# Patient Record
Sex: Female | Born: 1935 | Race: White | Hispanic: No | Smoking: Never smoker
Health system: Southern US, Community
[De-identification: ages and names within clinical notes are randomized; demographics above are authoritative.]

## PROBLEM LIST (undated history)

## (undated) DIAGNOSIS — I10 Essential (primary) hypertension: Secondary | ICD-10-CM

## (undated) DIAGNOSIS — K219 Gastro-esophageal reflux disease without esophagitis: Secondary | ICD-10-CM

## (undated) DIAGNOSIS — I219 Acute myocardial infarction, unspecified: Secondary | ICD-10-CM

## (undated) DIAGNOSIS — IMO0001 Reserved for inherently not codable concepts without codable children: Secondary | ICD-10-CM

## (undated) DIAGNOSIS — M069 Rheumatoid arthritis, unspecified: Secondary | ICD-10-CM

## (undated) HISTORY — DX: Reserved for inherently not codable concepts without codable children: IMO0001

## (undated) HISTORY — DX: Rheumatoid arthritis, unspecified: M06.9

## (undated) HISTORY — DX: Acute myocardial infarction, unspecified: I21.9

## (undated) HISTORY — DX: Gastro-esophageal reflux disease without esophagitis: K21.9

## (undated) HISTORY — DX: Essential (primary) hypertension: I10

---

## 2013-07-23 DIAGNOSIS — B351 Tinea unguium: Secondary | ICD-10-CM

## 2013-09-09 ENCOUNTER — Encounter (INDEPENDENT_AMBULATORY_CARE_PROVIDER_SITE_OTHER): Payer: Self-pay

## 2013-09-09 ENCOUNTER — Ambulatory Visit (INDEPENDENT_AMBULATORY_CARE_PROVIDER_SITE_OTHER): Payer: Medicare Other

## 2013-09-09 VITALS — BP 156/86 | HR 62 | Resp 18

## 2013-09-09 DIAGNOSIS — L03039 Cellulitis of unspecified toe: Secondary | ICD-10-CM

## 2013-09-09 DIAGNOSIS — S90121A Contusion of right lesser toe(s) without damage to nail, initial encounter: Secondary | ICD-10-CM

## 2013-09-09 DIAGNOSIS — M79609 Pain in unspecified limb: Secondary | ICD-10-CM

## 2013-09-09 DIAGNOSIS — M722 Plantar fascial fibromatosis: Secondary | ICD-10-CM

## 2013-09-09 DIAGNOSIS — B351 Tinea unguium: Secondary | ICD-10-CM

## 2013-09-09 DIAGNOSIS — E1140 Type 2 diabetes mellitus with diabetic neuropathy, unspecified: Secondary | ICD-10-CM

## 2013-09-09 DIAGNOSIS — L600 Ingrowing nail: Secondary | ICD-10-CM

## 2013-09-09 DIAGNOSIS — Q828 Other specified congenital malformations of skin: Secondary | ICD-10-CM

## 2013-09-09 MED ORDER — TRIAMCINOLONE ACETONIDE 10 MG/ML IJ SUSP: 10.0000 mg | Freq: Once | INTRAMUSCULAR | Status: DC

## 2013-09-09 MED ORDER — CEPHALEXIN 500 MG PO CAPS: 500.0000 mg | ORAL_CAPSULE | Freq: Three times a day (TID) | ORAL | Status: DC

## 2013-09-09 NOTE — Progress Notes (Signed)
Subjective:    Patient ID: Kirsten Gallegos, female    DOB: [birth year], 77 y.o.   MRN: [id number]  HPI trim my nails and this toenail i dropped a gallon orange juice and was about 2 1/2 weeks ago and has been draining and it looks like an ingrown Patient is also been having pain inferior aspect left heel for several weeks ago. Patient's husband died recently she's been on her feet doing less walking last several weeks has been trying her comfortable shoes has recalcitrant plantar fascial heel pain.   Review of Systems  Constitutional: Negative.   HENT: Negative.   Eyes: Positive for visual disturbance.  Respiratory: Negative.   Cardiovascular: Negative.   Gastrointestinal: Negative.   Endocrine: Negative.   Genitourinary: Negative.   Musculoskeletal: Negative.   Skin: Negative.   Allergic/Immunologic: Negative.   Neurological: Negative.   Hematological: Bruises/bleeds easily.  Psychiatric/Behavioral: Negative.        Objective:   Physical Exam  Vitals reviewed. Constitutional: She is oriented to person, place, and time. She appears well-developed and well-nourished.  Cardiovascular:  Pulses:      Dorsalis pedis pulses are 1+ on the right side, and 1+ on the left side.       Posterior tibial pulses are 1+ on the right side, and 1+ on the left side.  Capillary refill 3 seconds all digits bilateral skin temperature warm turgor normal no edema noted moderate varicosities bilateral. There is erythema of the right hallux lateral nail fold there is ecchymosis and edema of the hallux and second digit right foot.  Musculoskeletal:  Orthopedic biomechanical exam reveals hammertoe deformities 2 through 5 bilateral. 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 There is history of contusion to the hallux and second digit right foot drop the bowel or half gallon of orange juice on the toes 2 weeks ago. Since that time has had ingrowing infection right hallux.   Neurological: She is alert and oriented to person, place, and time. She has normal strength and normal reflexes.  Epicritic and proprioceptive sensations are diminished on Semmes Weinstein testing to forefoot and digits intact sensation proximally foot leg and ankle DTRs intact we'll plantar response noted bilateral  Skin: Skin is warm and dry. No cyanosis. Nails show no clubbing.  Skin color pigment normal moderate varicosities noted hair growth absent. There is paronychia edema and erythema and discharge drainage lateral nail fold right hallux. Ecchymosis of the hallux and second digit right foot consistent with contusion. This thickening friability discoloration and brittleness and tenderness on palpation of nails 1 through 5 bilateral consistent with history of onychomycosis. Otherwise has relatively thin skin with decreased turgor and atrophy of plantar fat pad. There is diffuse keratoses sub-second MTP area right secondary to rheumatoid arthropathy changes.  Psychiatric: She has a normal mood and affect. Her behavior is normal.          Assessment & Plan:  Diabetes with peripheral neuropathy. Some mild angiopathy noted as well. Paronychia with ingrowing nail right hallux secondary to contusion this we addressed at this time with an I&D AP nail partial excision lateral nail border under local anesthetic block Mr. total of 3 cc 50-50 mixture of 2% Xylocaine plain and 0.5 some Marcaine plain. Betadine prep was performed lateral nail border is excised phenol matricectomy was performed alcohol wash was applied Betadine ointment and a dry sterile dressing applied. Patient is instructed in Betadine soaks and prescription for cephalexin is called in. Recommended Tylenol as needed  for pain. Recheck in 2 weeks for nail check  Diabetes with neuropathy and onychomycosis: Mycotic friable dystrophic nails debrided x9 the remaining nails 2 through 5 on the right one through 5 left are debrided at this time  the presence of pain symptomology friability and discomfort also debridement of punctate keratoses subsecond MTP area right foot which shows a blistering lesion which is treated with Neosporin and gauze dressing as well this time. Followup in 3 months for continued or recurrent palliative care.  Plantar fascial heel pain: Per patient request injection 10 musculoskeletal 20 mg Xylocaine plain is infiltrated to the inferior left heel patient tolerated this well. Will maintain a good athletic or walking shoe. Maybe candidate for new diabetic shoes and custom insoles in the near future. Suggested followup on shoes at her earliest convenience may discuss this further at her postop nail followup with the next 2 weeks. Patient is devised use Tylenol as needed for pain management in the interim. Recheck in 2 week  Alvan Dame DPM

## 2013-09-09 NOTE — Patient Instructions (Signed)
ICE INSTRUCTIONS apply ice to left heel 3 times a day  Apply ice or cold pack to the affected area at least 3 times a day for 10-15 minutes each time.  You should also use ice after prolonged activity or vigorous exercise.  Do not apply ice longer than 20 minutes at one time.  Always keep a cloth between your skin and the ice pack to prevent burns.  Being consistent and following these instructions will help control your symptoms.  We suggest you purchase a gel ice pack because they are reusable and do bit leak.  Some of them are designed to wrap around the area.  Use the method that works best for you.  Here are some other suggestions for icing.   Use a frozen bag of peas or corn-inexpensive and molds well to your body, usually stays frozen for 10 to 20 minutes. Wet a towel with cold water and squeeze out the excess until it's damp.  Place in a bag in the freezer for 20 minutes. Then remove and use    Begin Betadine soaks of right great toe starting the day after procedure .Betadine Soak Instructions  Purchase an 8 oz. bottle of BETADINE solution (Povidone)  THE DAY AFTER THE PROCEDURE  Place 1 tablespoon of betadine solution in a quart of warm tap water.  Submerge your foot or feet with outer bandage intact for the initial soak; this will allow the bandage to become moist and wet for easy lift off.  Once you remove your bandage, continue to soak in the solution for 20 minutes.  This soak should be done twice a day.  Next, remove your foot or feet from solution, blot dry the affected area and cover.  You may use a band aid large enough to cover the area or use gauze and tape.  Apply other medications to the area as directed by the doctor such as cortisporin otic solution (ear drops) or neosporin.  IF YOUR SKIN BECOMES IRRITATED WHILE USING THESE INSTRUCTIONS, IT IS OKAY TO SWITCH TO EPSOM SALTS AND WATER OR WHITE VINEGAR AND WATER.

## 2013-10-14 ENCOUNTER — Ambulatory Visit (INDEPENDENT_AMBULATORY_CARE_PROVIDER_SITE_OTHER): Payer: Medicare Other

## 2013-10-14 DIAGNOSIS — Q828 Other specified congenital malformations of skin: Secondary | ICD-10-CM

## 2013-10-14 DIAGNOSIS — B351 Tinea unguium: Secondary | ICD-10-CM

## 2013-10-14 DIAGNOSIS — E1142 Type 2 diabetes mellitus with diabetic polyneuropathy: Secondary | ICD-10-CM

## 2013-10-14 DIAGNOSIS — E1149 Type 2 diabetes mellitus with other diabetic neurological complication: Secondary | ICD-10-CM

## 2013-10-14 DIAGNOSIS — E1140 Type 2 diabetes mellitus with diabetic neuropathy, unspecified: Secondary | ICD-10-CM

## 2013-10-14 DIAGNOSIS — M722 Plantar fascial fibromatosis: Secondary | ICD-10-CM

## 2013-10-14 NOTE — Patient Instructions (Signed)
Diabetes and Foot Care Diabetes may cause you to have problems because of poor blood supply (circulation) to your feet and legs. This may cause the skin on your feet to become thinner, break easier, and heal more slowly. Your skin may become dry, and the skin may peel and crack. You may also have nerve damage in your legs and feet causing decreased feeling in them. You may not notice minor injuries to your feet that could lead to infections or more serious problems. Taking care of your feet is one of the most important things you can do for yourself.  HOME CARE INSTRUCTIONS  Wear shoes at all times, even in the house. Do not go barefoot. Bare feet are easily injured.  Check your feet daily for blisters, cuts, and redness. If you cannot see the bottom of your feet, use a mirror or ask someone for help.  Wash your feet with warm water (do not use hot water) and mild soap. Then pat your feet and the areas between your toes until they are completely dry. Do not soak your feet as this can dry your skin.  Apply a moisturizing lotion or petroleum jelly (that does not contain alcohol and is unscented) to the skin on your feet and to dry, brittle toenails. Do not apply lotion between your toes.  Trim your toenails straight across. Do not dig under them or around the cuticle. File the edges of your nails with an emery board or nail file.  Do not cut corns or calluses or try to remove them with medicine.  Wear clean socks or stockings every day. Make sure they are not too tight. Do not wear knee-high stockings since they may decrease blood flow to your legs.  Wear shoes that fit properly and have enough cushioning. To break in new shoes, wear them for just a few hours a day. This prevents you from injuring your feet. Always look in your shoes before you put them on to be sure there are no objects inside.  Do not cross your legs. This may decrease the blood flow to your feet.  If you find a minor scrape,  cut, or break in the skin on your feet, keep it and the skin around it clean and dry. These areas may be cleansed with mild soap and water. Do not cleanse the area with peroxide, alcohol, or iodine.  When you remove an adhesive bandage, be sure not to damage the skin around it.  If you have a wound, look at it several times a day to make sure it is healing.  Do not use heating pads or hot water bottles. They may burn your skin. If you have lost feeling in your feet or legs, you may not know it is happening until it is too late.  Make sure your health care provider performs a complete foot exam at least annually or more often if you have foot problems. Report any cuts, sores, or bruises to your health care provider immediately. SEEK MEDICAL CARE IF:   You have an injury that is not healing.  You have cuts or breaks in the skin.  You have an ingrown nail.  You notice redness on your legs or feet.  You feel burning or tingling in your legs or feet.  You have pain or cramps in your legs and feet.  Your legs or feet are numb.  Your feet always feel cold. SEEK IMMEDIATE MEDICAL CARE IF:   There is increasing redness,   swelling, or pain in or around a wound.  There is a red line that goes up your leg.  Pus is coming from a wound.  You develop a fever or as directed by your health care provider.  You notice a bad smell coming from an ulcer or wound. Document Released: 10/18/2000 Document Revised: 06/23/2013 Document Reviewed: 03/30/2013 [employer] Patient Information 2014 [employer].  

## 2013-10-14 NOTE — Progress Notes (Signed)
   Subjective:    Patient ID: Kirsten Gallegos, female    DOB: 04-Jun-1936, 77 y.o.   MRN: [id number]  HPI my nail on right foot and my right foot has a calluses on it    Review of Systems deferred at this time     Objective:   Physical Exam Neurovascular status is intact and unchanged pedal pulses palpable plus one over 4 bilateral although diminished dermatologically skin color pigment normal there is punctate keratoses or hemorrhage a keratoses subsecond MTP area right to plantar flexed metatarsal digital contracture. No active discharge drainage no open ulcer noted. The nail lateral border right hallux is resolved well no discharge or drainage slight tenderness is noted nail still criptotic incurvated patient ready for debridement within 2 months for continued palliative nail care as scheduled. At this time the keratotic lesion of second right is debrided patient continues to Neosporin and Band-Aid dressing and keep the area cushioned with a cushioned insole in depth shoes. Patient did have improvement following steroid injections the inferior left heel there is no inferior plantar fascial pain nor current symptomology noted maintain good shoes with insoles as instructed. Patient taking NSAID therapy an as-needed basis if she has any further flareups or exacerbations       Assessment & Plan:  Assessment this time is resolving are stabilized plantar fasciitis left foot following steroid injection and use of insoles.  Assessment number to is resolved paronychia ingrowing nail  right hallux. No discharge or drainage noted. No secondary infection noted.  Has residual hemorrhage a keratoses secondary plantarflexed metatarsal subsecond right keratosis is debrided in maintain Neosporin and Band-Aid dressing as needed maintain accommodative insoles as instructed  Reappointed in 2 weeks for followup and palliative care diabetic foot and nail care debridement be carried out at that time.  Alvan Dame DPM

## 2013-11-18 ENCOUNTER — Ambulatory Visit (INDEPENDENT_AMBULATORY_CARE_PROVIDER_SITE_OTHER): Payer: Medicare Other

## 2013-11-18 VITALS — BP 129/71 | HR 73 | Resp 16

## 2013-11-18 DIAGNOSIS — B351 Tinea unguium: Secondary | ICD-10-CM

## 2013-11-18 DIAGNOSIS — Q828 Other specified congenital malformations of skin: Secondary | ICD-10-CM

## 2013-11-18 DIAGNOSIS — E1140 Type 2 diabetes mellitus with diabetic neuropathy, unspecified: Secondary | ICD-10-CM

## 2013-11-18 DIAGNOSIS — L97509 Non-pressure chronic ulcer of other part of unspecified foot with unspecified severity: Secondary | ICD-10-CM

## 2013-11-18 DIAGNOSIS — L03039 Cellulitis of unspecified toe: Secondary | ICD-10-CM

## 2013-11-18 DIAGNOSIS — M79609 Pain in unspecified limb: Secondary | ICD-10-CM

## 2013-11-18 DIAGNOSIS — E1142 Type 2 diabetes mellitus with diabetic polyneuropathy: Secondary | ICD-10-CM

## 2013-11-18 DIAGNOSIS — L600 Ingrowing nail: Secondary | ICD-10-CM

## 2013-11-18 DIAGNOSIS — E1149 Type 2 diabetes mellitus with other diabetic neurological complication: Secondary | ICD-10-CM

## 2013-11-18 MED ORDER — CEPHALEXIN 500 MG PO CAPS: 500.0000 mg | ORAL_CAPSULE | Freq: Three times a day (TID) | ORAL | Status: DC

## 2013-11-18 MED ORDER — SILVER SULFADIAZINE 1 % EX CREA: 1.0000 "application " | TOPICAL_CREAM | Freq: Every day | CUTANEOUS | Status: DC

## 2013-11-18 NOTE — Progress Notes (Signed)
   Subjective:    Patient ID: Kirsten Gallegos, female    DOB: [birth year], 78 y.o.   MRN: [id number]  HPI My big toe on my left toe is rubbing against the shoe and I have a place on the ball of my right foot and a corn on my fourth toe Patient is thick criptotic incurvated nails and debridement. Her patient is also complaining of multiple areas of soreness has a painful corn on the fourth toe right foot with discharge or drainage. Also the nail distal tuft of the left hallux has erythema and discharge drainage. And has ulceration sub-second MTP area of the right foot. Always extremely painful with hyperesthesia due the diabetic neuropathy. Patient has been doing all the activities and taking care of her household for multiple family members. Patient is advised she needs to be restricted in activities and be on her feet no more than 5 or 10 minutes per hour at this point.   Review of Systems no new changes or findings.     Objective:   Physical Exam Vascular status is intact with pedal pulses palpable DP plus one over 4 bilateral PT plus one over 4 bilateral Refill time 3 seconds all digits. Neurologically epicritic and proprioceptive sensations intact although diminished sensation Semmes Weinstein testing to the digits and plantar forefoot patient also severe hyperesthesia in the areas of the hallux and subsecond and HD 5 and 4 right. Nails thick brittle criptotic incurvated in ingrowing. Patient is digital contractures multiple keratoses with atrophy of the plantar fat pad. Does wear diabetic shoes Germaine Pomfret type shoe with possibly leg however center feet for many hours out of the day. There is active ulceration and infection of the foot at this time with discharge drainage will initiate reduce activities and wound care at this point       Assessment & Plan:  Assessment diabetes with peripheral neuropathy. Secondary promise patient does have thick brittle crumbly friable orthotic nails which he  debridement 1 through 5 bilateral at this time. Also several areas of ulceration are addressed distal nail border left great toe subsecond MTP area right and HD 4 right all showing ulceration down to subcutaneous tissue level each about half centimeter in size or less does not go down to bone or capsule no ascending cellulitis or lymphangitis however severe paresthesia are noted. The ulcers are debrided and cleansed with all cleansed Silvadene and gauze dressings are applied as needed prescription for Silvadene are given at this time patient also placed on oral antibiotic recheck in 2 weeks for followup  Alvan Dame DPM

## 2013-11-18 NOTE — Patient Instructions (Addendum)
ANTIBACTERIAL SOAP INSTRUCTIONS  THE DAY AFTER PROCEDURE  Please follow the instructions your doctor has marked.   Shower as usual. Before getting out, place a drop of antibacterial liquid soap (Dial) on a wet, clean washcloth.  Gently wipe washcloth over affected area.  Afterward, rinse the area with warm water.  Blot the area dry with a soft cloth and cover with antibiotic ointment (neosporin, polysporin, bacitracin) and band aid or gauze and tape  Place 3-4 drops of antibacterial liquid soap in a quart of warm tap water.  Submerge foot into water for 20 minutes.  If bandage was applied after your procedure, leave on to allow for easy lift off, then remove and continue with soak for the remaining time.  Next, blot area dry with a soft cloth and cover with a bandage.  Apply other medications as directed by your doctor, such as cortisporin otic solution (eardrops) or neosporin antibiotic ointment  Following cleansing with soap and water and drying apply Silvadene and Band-Aid or gauze dressing as needed change dressing daily for the next several weeks until result    Instructions regarding daily activities and walking.: Patient is limited to standing and walking 5 minutes to 10 minutes per hour only. When not standing or walking needs to be in a recliner with feet elevated equal or above the level of the heart. When standing or walking must maintain diabetic shoes thick cushioned socks and dressings at all times. No unnecessary work or activities, this includes no housework and vacuuming cleaning no yard work no bending stooping or lifting or carrying objects Patient will maintain restricted activity level for at least 4-6 weeks until completely resolution of ulcers and foot infection. After resolution may slowly increase activities however cannot be on her feet for extended periods of time at any point in the future.  Alvan Dame DPM

## 2013-12-01 ENCOUNTER — Ambulatory Visit (INDEPENDENT_AMBULATORY_CARE_PROVIDER_SITE_OTHER): Payer: Medicare Other

## 2013-12-01 VITALS — BP 131/69 | HR 71 | Resp 18

## 2013-12-01 DIAGNOSIS — E1140 Type 2 diabetes mellitus with diabetic neuropathy, unspecified: Secondary | ICD-10-CM

## 2013-12-01 DIAGNOSIS — M79609 Pain in unspecified limb: Secondary | ICD-10-CM

## 2013-12-01 DIAGNOSIS — Q828 Other specified congenital malformations of skin: Secondary | ICD-10-CM

## 2013-12-01 DIAGNOSIS — E1149 Type 2 diabetes mellitus with other diabetic neurological complication: Secondary | ICD-10-CM

## 2013-12-01 DIAGNOSIS — E1142 Type 2 diabetes mellitus with diabetic polyneuropathy: Secondary | ICD-10-CM

## 2013-12-01 DIAGNOSIS — L97509 Non-pressure chronic ulcer of other part of unspecified foot with unspecified severity: Secondary | ICD-10-CM

## 2013-12-01 NOTE — Progress Notes (Signed)
   Subjective:    Patient ID: Kirsten Gallegos, female    DOB: 1936-05-07, 78 y.o.   MRN: [id number]  HPI There is a callus on the ball of my right foot and the left big toe is sore due to my shoe rubbing on this toe and is somewhat red    Review of Systems no new changes or findings patient does have history of rheumatoid arthropathy with deformities as well as diabetes with neuropathy     Objective:   Physical Exam Vascular status as follows pedal pulses palpable DP plus one over 4 bilateral PT plus one over 4 bilateral Refill timed 3-4 seconds all digits neurologically epicritic and proprioceptive sensations intact although decreased on Semmes Weinstein testing to the digits and plantar forefoot. Neurologically DTRs not elicited normal plantar response noted in orthopedic biomechanical exam rectus foot type with rigid digital contractures arthropathy patient has rheumatoid changes affecting her hands and feet. There is plantar flexed metatarsal second met right also the hallux is left foot is in a slight extensors with keratoses and distal tuft. Patient wearing a pair of maryjane 10 diabetic shoes which is causing some irritation of the distal tuft of her left hallux there is a slight seem initiate may be causing this hours ask your toe extending dorsally rubbing against the top of the shoe causing problem to foam padding is applied after debridement of keratoses no ulceration noted on left hallux right foot demonstrates ulceration sub-second MP. Down to dermal level no purulent discharge or drainage is noted has severe atrophy the skin and fat pad plantar aspect of the foot to arthropathy as well. No secondary infection is noted no ascending cellulitis lymphangitis noted mild varicosities mild edema noted bilateral.       Assessment & Plan:  Assessment diabetes with neuropathy rheumatoid arthritis and deformity of toes and digits and plantar flexed metatarsals with associated ulceration  sub-second right ulcer debrided down and dermal level Silvadene and gauze dressing will be maintained is applied today maintain by patient the next several weeks. Patient will get authorizations for new diabetic new diabetic extra-depth shoes followup with the next 2 months for palliative nail care and possible shoe authorization. In the intervertebral pad also applied to the left hallux maintain accommodative shoes  Harriet Masson DPM

## 2013-12-01 NOTE — Patient Instructions (Signed)
Diabetes and Foot Care Diabetes may cause you to have problems because of poor blood supply (circulation) to your feet and legs. This may cause the skin on your feet to become thinner, break easier, and heal more slowly. Your skin may become dry, and the skin may peel and crack. You may also have nerve damage in your legs and feet causing decreased feeling in them. You may not notice minor injuries to your feet that could lead to infections or more serious problems. Taking care of your feet is one of the most important things you can do for yourself.  HOME CARE INSTRUCTIONS  Wear shoes at all times, even in the house. Do not go barefoot. Bare feet are easily injured.  Check your feet daily for blisters, cuts, and redness. If you cannot see the bottom of your feet, use a mirror or ask someone for help.  Wash your feet with warm water (do not use hot water) and mild soap. Then pat your feet and the areas between your toes until they are completely dry. Do not soak your feet as this can dry your skin.  Apply a moisturizing lotion or petroleum jelly (that does not contain alcohol and is unscented) to the skin on your feet and to dry, brittle toenails. Do not apply lotion between your toes.  Trim your toenails straight across. Do not dig under them or around the cuticle. File the edges of your nails with an emery board or nail file.  Do not cut corns or calluses or try to remove them with medicine.  Wear clean socks or stockings every day. Make sure they are not too tight. Do not wear knee-high stockings since they may decrease blood flow to your legs.  Wear shoes that fit properly and have enough cushioning. To break in new shoes, wear them for just a few hours a day. This prevents you from injuring your feet. Always look in your shoes before you put them on to be sure there are no objects inside.  Do not cross your legs. This may decrease the blood flow to your feet.  If you find a minor scrape,  cut, or break in the skin on your feet, keep it and the skin around it clean and dry. These areas may be cleansed with mild soap and water. Do not cleanse the area with peroxide, alcohol, or iodine.  When you remove an adhesive bandage, be sure not to damage the skin around it.  If you have a wound, look at it several times a day to make sure it is healing.  Do not use heating pads or hot water bottles. They may burn your skin. If you have lost feeling in your feet or legs, you may not know it is happening until it is too late.  Make sure your health care provider performs a complete foot exam at least annually or more often if you have foot problems. Report any cuts, sores, or bruises to your health care provider immediately. SEEK MEDICAL CARE IF:   You have an injury that is not healing.  You have cuts or breaks in the skin.  You have an ingrown nail.  You notice redness on your legs or feet.  You feel burning or tingling in your legs or feet.  You have pain or cramps in your legs and feet.  Your legs or feet are numb.  Your feet always feel cold. SEEK IMMEDIATE MEDICAL CARE IF:   There is increasing redness,   swelling, or pain in or around a wound.  There is a red line that goes up your leg.  Pus is coming from a wound.  You develop a fever or as directed by your health care provider.  You notice a bad smell coming from an ulcer or wound. Document Released: 10/18/2000 Document Revised: 06/23/2013 Document Reviewed: 03/30/2013 [employer] Patient Information 2014 [employer].  

## 2013-12-08 ENCOUNTER — Ambulatory Visit: Payer: Medicare Other

## 2014-02-16 ENCOUNTER — Ambulatory Visit (INDEPENDENT_AMBULATORY_CARE_PROVIDER_SITE_OTHER): Payer: Medicare Other

## 2014-02-16 VITALS — BP 165/98 | HR 73 | Resp 18

## 2014-02-16 DIAGNOSIS — E1140 Type 2 diabetes mellitus with diabetic neuropathy, unspecified: Secondary | ICD-10-CM

## 2014-02-16 DIAGNOSIS — L97509 Non-pressure chronic ulcer of other part of unspecified foot with unspecified severity: Secondary | ICD-10-CM

## 2014-02-16 DIAGNOSIS — E1149 Type 2 diabetes mellitus with other diabetic neurological complication: Secondary | ICD-10-CM

## 2014-02-16 DIAGNOSIS — B351 Tinea unguium: Secondary | ICD-10-CM

## 2014-02-16 DIAGNOSIS — L600 Ingrowing nail: Secondary | ICD-10-CM

## 2014-02-16 DIAGNOSIS — M79609 Pain in unspecified limb: Secondary | ICD-10-CM

## 2014-02-16 DIAGNOSIS — Q828 Other specified congenital malformations of skin: Secondary | ICD-10-CM

## 2014-02-16 NOTE — Patient Instructions (Signed)
Diabetes and Foot Care Diabetes may cause you to have problems because of poor blood supply (circulation) to your feet and legs. This may cause the skin on your feet to become thinner, break easier, and heal more slowly. Your skin may become dry, and the skin may peel and crack. You may also have nerve damage in your legs and feet causing decreased feeling in them. You may not notice minor injuries to your feet that could lead to infections or more serious problems. Taking care of your feet is one of the most important things you can do for yourself.  HOME CARE INSTRUCTIONS  Wear shoes at all times, even in the house. Do not go barefoot. Bare feet are easily injured.  Check your feet daily for blisters, cuts, and redness. If you cannot see the bottom of your feet, use a mirror or ask someone for help.  Wash your feet with warm water (do not use hot water) and mild soap. Then pat your feet and the areas between your toes until they are completely dry. Do not soak your feet as this can dry your skin.  Apply a moisturizing lotion or petroleum jelly (that does not contain alcohol and is unscented) to the skin on your feet and to dry, brittle toenails. Do not apply lotion between your toes.  Trim your toenails straight across. Do not dig under them or around the cuticle. File the edges of your nails with an emery board or nail file.  Do not cut corns or calluses or try to remove them with medicine.  Wear clean socks or stockings every day. Make sure they are not too tight. Do not wear knee-high stockings since they may decrease blood flow to your legs.  Wear shoes that fit properly and have enough cushioning. To break in new shoes, wear them for just a few hours a day. This prevents you from injuring your feet. Always look in your shoes before you put them on to be sure there are no objects inside.  Do not cross your legs. This may decrease the blood flow to your feet.  If you find a minor scrape,  cut, or break in the skin on your feet, keep it and the skin around it clean and dry. These areas may be cleansed with mild soap and water. Do not cleanse the area with peroxide, alcohol, or iodine.  When you remove an adhesive bandage, be sure not to damage the skin around it.  If you have a wound, look at it several times a day to make sure it is healing.  Do not use heating pads or hot water bottles. They may burn your skin. If you have lost feeling in your feet or legs, you may not know it is happening until it is too late.  Make sure your health care provider performs a complete foot exam at least annually or more often if you have foot problems. Report any cuts, sores, or bruises to your health care provider immediately. SEEK MEDICAL CARE IF:   You have an injury that is not healing.  You have cuts or breaks in the skin.  You have an ingrown nail.  You notice redness on your legs or feet.  You feel burning or tingling in your legs or feet.  You have pain or cramps in your legs and feet.  Your legs or feet are numb.  Your feet always feel cold. SEEK IMMEDIATE MEDICAL CARE IF:   There is increasing redness,   swelling, or pain in or around a wound.  There is a red line that goes up your leg.  Pus is coming from a wound.  You develop a fever or as directed by your health care provider.  You notice a bad smell coming from an ulcer or wound. Document Released: 10/18/2000 Document Revised: 06/23/2013 Document Reviewed: 03/30/2013 [employer] Patient Information 2014 [employer].  

## 2014-02-16 NOTE — Progress Notes (Signed)
   Subjective:    Patient ID: Kirsten Gallegos, female    DOB: 1936/09/26, 78 y.o.   MRN: [id number]  HPI my right foot has a callus on the bottom of my foot and the left big toenail he will have to deadened because it is just too sore for him to touch and I was in the hospital for 7 days for inflammed stomach and UTI and colitis and pheumonia and my oxygen level dropped and then I was in the nursing home and stayed there for 7 days    Review of Systems any systemic changes or findings noted     Objective:   Physical Exam 78 year old female well-developed well-nourished oriented x3 in some distress patient has been hospitalized with recent infections and other complications also spent some time in the nursing home patient does have painful thick brittle crumbly from criptotic nails 1 through 5 bilateral significantly left hallux nail is painful no secondary infection noted the patient does have single keratotic lesion subsecond MTP area right foot secondary to plantar flexed metatarsal and arthropathy deformities. Patient's or bunion deformity and hammertoe deformities vascular status is intact with pedal pulses palpable DP plus one over 4 PT plus one over 4 bilateral capillary refill time 4 seconds epicritic sensation diminished on Semmes Weinstein testing however there is hyperesthesia feet as well bilateral.       Assessment & Plan:  Assessment this time his diabetes with peripheral neuropathy to plantar flexed metatarsal second right with porokeratosis is debrided no active ulcer or wound is noted no secondary infection noted also thick painful mycotic brittle nails 1 through 5 bilateral debridement at this time the presence of pain in symptomology as well as diabetes return for future palliative care every 3 months as needed  Alvan Dame DPM

## 2014-04-25 ENCOUNTER — Ambulatory Visit: Payer: Medicare Other | Admitting: Podiatrist

## 2014-04-25 ENCOUNTER — Ambulatory Visit (INDEPENDENT_AMBULATORY_CARE_PROVIDER_SITE_OTHER): Payer: Medicare Other | Admitting: Podiatrist

## 2014-04-25 ENCOUNTER — Encounter: Payer: Self-pay | Admitting: Podiatrist

## 2014-04-25 VITALS — BP 170/83 | HR 72 | Resp 18

## 2014-04-25 DIAGNOSIS — E1149 Type 2 diabetes mellitus with other diabetic neurological complication: Secondary | ICD-10-CM

## 2014-04-25 DIAGNOSIS — L97509 Non-pressure chronic ulcer of other part of unspecified foot with unspecified severity: Secondary | ICD-10-CM

## 2014-04-25 DIAGNOSIS — E1142 Type 2 diabetes mellitus with diabetic polyneuropathy: Secondary | ICD-10-CM

## 2014-04-25 DIAGNOSIS — E1140 Type 2 diabetes mellitus with diabetic neuropathy, unspecified: Secondary | ICD-10-CM

## 2014-04-25 MED ORDER — SILVER SULFADIAZINE 1 % EX CREA
1.0000 | TOPICAL_CREAM | Freq: Every day | CUTANEOUS | Status: DC
Start: 2014-04-25 — End: 2014-12-09

## 2014-04-25 NOTE — Patient Instructions (Signed)
Instructions for Wound Care  The most important step to healing a foot wound is to reduce the pressure on your foot - it is extremely important to stay off your foot as much as possible  Cleanse your foot with saline wash or warm soapy water (dial antibacterial soap or similar).  Blot dry.  Apply prescribed medication to your wound and cover with gauze and a bandage.  May hold bandage in place with Coban (self sticky wrap), Ace bandage or tape.  You may find dressing supplies at your local [employer], [employer], drug store or medical supply store.  Your prescribed topical medication is :  SILVADENE CREAM- IT HAS BEEN CALLED INTO YOUR PHARMACY   If you notice any foul odor, increase in pain, pus, increased swelling, red streaks or generalized redness occurring in your foot or leg-Call our office immediately to be seen.  This may be a sign of a limb or life threatening infection that will need prompt attention.

## 2014-04-25 NOTE — Progress Notes (Signed)
I NEED THESE PLACES TRIMMED UP ON MY FEET AND I WAS IN THE HOSPITAL FOR FOOD POISON AND WAS IN THERE FOR 2 DAYS AND WAS 3 WEEKS AGO  Patient presents for lesion on the plantar aspect of the right foot submet 2.  She states it is a callus that is bothering her and needs it trimmed  Physical Exam Vascular status intact at 1/4 dp and pt bilateral.  Neurovascular status unchanged with peripheral neuropathy present.  Ulcer is present submet 2 of the right foot.  Once debrided a full thickness ulceration is present measuring 67mm in diameter 3 mm in depth.  No malodor, no undermining, no streaking or signs of infection are present.  Assessment:  Ulcer submet 2 right foot  Plan:  Wound debrided to viable tissue.  Patient to resume wound care orders of silvadene cream and a dressing.  She is to stay off her foot and she states she will be able to do this.  She will be seen back in 2 weeks for follow up.  If any concerns arise she will call.

## 2014-05-18 ENCOUNTER — Ambulatory Visit: Payer: Medicare Other

## 2014-06-21 ENCOUNTER — Encounter: Payer: Self-pay | Admitting: Podiatrist

## 2014-06-21 ENCOUNTER — Ambulatory Visit (INDEPENDENT_AMBULATORY_CARE_PROVIDER_SITE_OTHER): Payer: Medicare Other | Admitting: Podiatrist

## 2014-06-21 VITALS — BP 183/91 | HR 77 | Resp 18

## 2014-06-21 DIAGNOSIS — M216X1 Other acquired deformities of right foot: Secondary | ICD-10-CM

## 2014-06-21 DIAGNOSIS — Q828 Other specified congenital malformations of skin: Secondary | ICD-10-CM

## 2014-06-21 DIAGNOSIS — E1149 Type 2 diabetes mellitus with other diabetic neurological complication: Secondary | ICD-10-CM

## 2014-06-21 DIAGNOSIS — E1140 Type 2 diabetes mellitus with diabetic neuropathy, unspecified: Secondary | ICD-10-CM

## 2014-06-21 NOTE — Patient Instructions (Signed)
Corns and Calluses Corns are small areas of thickened skin that usually occur on the top, sides, or tip of a toe. They contain a cone-shaped core with a point that can press on a nerve below. This causes pain. Calluses are areas of thickened skin that usually develop on hands, fingers, palms, soles of the feet, and heels. These are areas that experience frequent friction or pressure. CAUSES  Corns are usually the result of rubbing (friction) or pressure from shoes that are too tight or do not fit properly. Calluses are caused by repeated friction and pressure on the affected areas. SYMPTOMS  A hard growth on the skin.  Pain or tenderness under the skin.  Sometimes, redness and swelling.  Increased discomfort while wearing tight-fitting shoes. DIAGNOSIS  Your caregiver can usually tell what the problem is by doing a physical exam. TREATMENT  Removing the cause of the friction or pressure is usually the only treatment needed. However, sometimes medicines can be used to help soften the hardened, thickened areas. These medicines include salicylic acid plasters and 12% ammonium lactate lotion. These medicines should only be used under the direction of your caregiver. HOME CARE INSTRUCTIONS   Try to remove pressure from the affected area.  You may wear donut-shaped corn pads to protect your skin.  You may use a pumice stone or nonmetallic nail file to gently reduce the thickness of a corn.  Wear properly fitted footwear.  If you have calluses on the hands, wear gloves during activities that cause friction.  If you have diabetes, you should regularly examine your feet. Tell your caregiver if you notice any problems with your feet. SEEK IMMEDIATE MEDICAL CARE IF:   You have increased pain, swelling, redness, or warmth in the affected area.  Your corn or callus starts to drain fluid or bleeds.  You are not getting better, even with treatment. Document Released: 07/27/2004 Document  Revised: 01/13/2012 Document Reviewed: 06/18/2011 Exit[employer] Patient Information 2015 [employer]. This information is not intended to replace advice given to you by your health care provider. Make sure you discuss any questions you have with your health care provider.  

## 2014-06-23 NOTE — Progress Notes (Signed)
Patient presents for lesion on the plantar aspect of the right foot submet 2. She states it is a callus that is bothering her and needs it trimmed  Physical Exam  Vascular status intact at 1/4 dp and pt bilateral. Neurovascular status unchanged with peripheral neuropathy present.  Healed Ulcer is present submet 2 of the right foot. Once debrided friable fragile skin is present. Hemorrhagic tissue is present. No malodor, no undermining, no streaking or signs of infection are present.   Assessment: callus, pre ulcerative lesion submet 2 right foot  Plan: hyperkeratotic lesion debrided.  She will stay off her foot as much as she is able. Will recheck in 3 weeks.  If any concerns arise she will call.

## 2014-07-12 ENCOUNTER — Encounter: Payer: Self-pay | Admitting: Podiatrist

## 2014-07-12 ENCOUNTER — Ambulatory Visit (INDEPENDENT_AMBULATORY_CARE_PROVIDER_SITE_OTHER): Payer: Medicare Other | Admitting: Podiatrist

## 2014-07-12 VITALS — BP 153/69 | HR 76 | Resp 18

## 2014-07-12 DIAGNOSIS — M216X1 Other acquired deformities of right foot: Secondary | ICD-10-CM

## 2014-07-12 DIAGNOSIS — E1140 Type 2 diabetes mellitus with diabetic neuropathy, unspecified: Secondary | ICD-10-CM

## 2014-07-12 DIAGNOSIS — L97509 Non-pressure chronic ulcer of other part of unspecified foot with unspecified severity: Secondary | ICD-10-CM

## 2014-07-12 NOTE — Progress Notes (Signed)
Patient presents for lesion on the plantar aspect of the right foot submet 2. She states that is bothering her and needs it trimmed  Physical Exam  Vascular status intact at 1/4 dp and pt bilateral. Neurovascular status unchanged with peripheral neuropathy present. relapsed Ulcer is present submet 2 of the right foot. Once debrided friable fragile skin is present. Hemorrhagic tissue is present. No malodor, no undermining, no streaking or signs of infection are present.  Assessment: callus, pre ulcerative lesion submet 2 right foot  Plan: ulcer with surrounding hyperkeratotic lesion debrided. She will stay off her foot as much as she is able. Offloading pad added to her current diabetic insert. ill recheck in 3 weeks. If any concerns arise she will call.

## 2014-08-02 ENCOUNTER — Ambulatory Visit: Payer: Medicare Other | Admitting: Podiatrist

## 2014-08-16 ENCOUNTER — Ambulatory Visit (INDEPENDENT_AMBULATORY_CARE_PROVIDER_SITE_OTHER): Payer: Medicare Other | Admitting: Podiatrist

## 2014-08-16 ENCOUNTER — Encounter: Payer: Self-pay | Admitting: Podiatrist

## 2014-08-16 VITALS — BP 122/68 | HR 76 | Resp 12

## 2014-08-16 DIAGNOSIS — B351 Tinea unguium: Secondary | ICD-10-CM

## 2014-08-16 DIAGNOSIS — M79676 Pain in unspecified toe(s): Secondary | ICD-10-CM

## 2014-08-16 DIAGNOSIS — M216X1 Other acquired deformities of right foot: Secondary | ICD-10-CM

## 2014-08-16 DIAGNOSIS — E1140 Type 2 diabetes mellitus with diabetic neuropathy, unspecified: Secondary | ICD-10-CM

## 2014-08-16 DIAGNOSIS — Q828 Other specified congenital malformations of skin: Secondary | ICD-10-CM

## 2014-08-16 NOTE — Progress Notes (Signed)
Patient presents for nail care and for follow up of lesion on the plantar aspect of the right foot submet 2. She states that is bothering her and needs it trimmed.  She has also recently been in the hospital for a bladder and urinary tract infection.    Physical Exam  Vascular status intact at 1/4 dp and pt bilateral. Neurovascular status unchanged with peripheral neuropathy present. Hyperkeratotic lesion is present submet 2 of the right foot. Once debrided intact integument is present. Patients toenails are elongated and thickened.  Pain at the tip of the left great toe is present due to pressing on the tips of her shoes.  Assessment: callus, pre ulcerative lesion submet 2 right foot , symptomatic mycotic toenails x 10  Plan: debridement of the hyperkeratotic tissue is carried out.  Toenails are also debrided today.  I used the ring stretcher to stretch the end of her shoe where her toe is pressing.  She will be seen back in 3 months or as needed for follow up.

## 2014-10-13 ENCOUNTER — Ambulatory Visit (INDEPENDENT_AMBULATORY_CARE_PROVIDER_SITE_OTHER): Payer: Medicare Other

## 2014-10-13 VITALS — BP 152/87 | HR 64 | Resp 12

## 2014-10-13 DIAGNOSIS — M79676 Pain in unspecified toe(s): Secondary | ICD-10-CM

## 2014-10-13 DIAGNOSIS — E1140 Type 2 diabetes mellitus with diabetic neuropathy, unspecified: Secondary | ICD-10-CM

## 2014-10-13 DIAGNOSIS — L600 Ingrowing nail: Secondary | ICD-10-CM

## 2014-10-13 DIAGNOSIS — B351 Tinea unguium: Secondary | ICD-10-CM

## 2014-10-13 DIAGNOSIS — Q828 Other specified congenital malformations of skin: Secondary | ICD-10-CM

## 2014-10-13 NOTE — Patient Instructions (Signed)
Diabetes and Foot Care Diabetes may cause you to have problems because of poor blood supply (circulation) to your feet and legs. This may cause the skin on your feet to become thinner, break easier, and heal more slowly. Your skin may become dry, and the skin may peel and crack. You may also have nerve damage in your legs and feet causing decreased feeling in them. You may not notice minor injuries to your feet that could lead to infections or more serious problems. Taking care of your feet is one of the most important things you can do for yourself.  HOME CARE INSTRUCTIONS  Wear shoes at all times, even in the house. Do not go barefoot. Bare feet are easily injured.  Check your feet daily for blisters, cuts, and redness. If you cannot see the bottom of your feet, use a mirror or ask someone for help.  Wash your feet with warm water (do not use hot water) and mild soap. Then pat your feet and the areas between your toes until they are completely dry. Do not soak your feet as this can dry your skin.  Apply a moisturizing lotion or petroleum jelly (that does not contain alcohol and is unscented) to the skin on your feet and to dry, brittle toenails. Do not apply lotion between your toes.  Trim your toenails straight across. Do not dig under them or around the cuticle. File the edges of your nails with an emery board or nail file.  Do not cut corns or calluses or try to remove them with medicine.  Wear clean socks or stockings every day. Make sure they are not too tight. Do not wear knee-high stockings since they may decrease blood flow to your legs.  Wear shoes that fit properly and have enough cushioning. To break in new shoes, wear them for just a few hours a day. This prevents you from injuring your feet. Always look in your shoes before you put them on to be sure there are no objects inside.  Do not cross your legs. This may decrease the blood flow to your feet.  If you find a minor scrape,  cut, or break in the skin on your feet, keep it and the skin around it clean and dry. These areas may be cleansed with mild soap and water. Do not cleanse the area with peroxide, alcohol, or iodine.  When you remove an adhesive bandage, be sure not to damage the skin around it.  If you have a wound, look at it several times a day to make sure it is healing.  Do not use heating pads or hot water bottles. They may burn your skin. If you have lost feeling in your feet or legs, you may not know it is happening until it is too late.  Make sure your health care provider performs a complete foot exam at least annually or more often if you have foot problems. Report any cuts, sores, or bruises to your health care provider immediately. SEEK MEDICAL CARE IF:   You have an injury that is not healing.  You have cuts or breaks in the skin.  You have an ingrown nail.  You notice redness on your legs or feet.  You feel burning or tingling in your legs or feet.  You have pain or cramps in your legs and feet.  Your legs or feet are numb.  Your feet always feel cold. SEEK IMMEDIATE MEDICAL CARE IF:   There is increasing redness,   swelling, or pain in or around a wound.  There is a red line that goes up your leg.  Pus is coming from a wound.  You develop a fever or as directed by your health care provider.  You notice a bad smell coming from an ulcer or wound. Document Released: 10/18/2000 Document Revised: 06/23/2013 Document Reviewed: 03/30/2013 [employer] Patient Information 2015 [employer]. This information is not intended to replace advice given to you by your health care provider. Make sure you discuss any questions you have with your health care provider.  

## 2014-10-13 NOTE — Progress Notes (Signed)
   Subjective:    Patient ID: Kirsten Gallegos, female    DOB: 1936/08/18, 78 y.o.   MRN: [id number]  HPI  RT FOOT CALLUS NEED TO BE TRIM.  NEW PROBLEM:  LT FOOT GREAT TOP OF THE TOE IS BEEN SORE FOR 2 MONTHS. THE TOE IS GETTING WORSE AND VERY SENSITIVE ESPECIALLY WHEN WEARING CLOSED SHOES. TRIED NEOSPORIN BUT NO HELP.  Review of Systems  Musculoskeletal: Positive for gait problem.  All other systems reviewed and are negative.      Objective:   Physical Exam Neurovascular status is intact and unchanged pedal pulses DP plus one PT thready nonpalpable bilateral Refill timed 34 seconds all digits epicritic sensations intact although patient does have hyperesthesia particular left great toe there is a healed ulceration distal tuft of left hallux however there is signs of ecchymosis or contusion to the distal hallux the nail plate shows lysis or separation from the nailbed and tenderness on no purulence signs of old dried blood in the nailbed are identified the remaining nails also show thickening brittleness Crumley this and friability consistent with onychomycosis does have history of diabetes with complications history of peripheral neuropathy and neuritis neuralgia there is healed ulceration distal tuft of the left hallux there is in the hallux and second digit right foot due to the overlapping digit. Multiple keratotic lesions are debrided however the most part thick brittle Crumley friable mycotic nails 1 through 5 bilateral debrided at this time in particular left hallux nail plate and keratoses distal tuft of the left hallux dispensed some tube foam padding for cushioning suggested a one-month follow-up for keratoses and pre-ulcerative lesion check and 3 month follow-up for palliative diabetic foot and nail care.       Assessment & Plan:  Assessment this time history of diabetes with history peripheral neuropathy mycotic brittle chromic frontal mycotic nails debrided 10 at this time still  keratotic lesion distal tuft of left hallux is debrided and to foam padding is dispensed for cushioning of the contusion of the hallux. Patient cannot go 3 months between treatments without having breakdown ulceration so we'll follow-up for possible debridement of keratoses of the hallux within 1 month in 3 months for palliative nail care next  Alvan Dame DPM

## 2014-11-18 ENCOUNTER — Ambulatory Visit: Payer: Medicare Other

## 2014-11-25 ENCOUNTER — Ambulatory Visit: Payer: Medicare Other

## 2014-12-09 ENCOUNTER — Ambulatory Visit (INDEPENDENT_AMBULATORY_CARE_PROVIDER_SITE_OTHER): Payer: Medicare Other

## 2014-12-09 VITALS — BP 135/78 | HR 86 | Resp 18

## 2014-12-09 DIAGNOSIS — E1140 Type 2 diabetes mellitus with diabetic neuropathy, unspecified: Secondary | ICD-10-CM | POA: Diagnosis not present

## 2014-12-09 DIAGNOSIS — L97519 Non-pressure chronic ulcer of other part of right foot with unspecified severity: Principal | ICD-10-CM

## 2014-12-09 DIAGNOSIS — L89891 Pressure ulcer of other site, stage 1: Secondary | ICD-10-CM | POA: Diagnosis not present

## 2014-12-09 DIAGNOSIS — Q828 Other specified congenital malformations of skin: Secondary | ICD-10-CM

## 2014-12-09 DIAGNOSIS — L97529 Non-pressure chronic ulcer of other part of left foot with unspecified severity: Principal | ICD-10-CM

## 2014-12-09 DIAGNOSIS — E08621 Diabetes mellitus due to underlying condition with foot ulcer: Secondary | ICD-10-CM

## 2014-12-09 MED ORDER — SILVER SULFADIAZINE 1 % EX CREA: 1.0000 "application " | TOPICAL_CREAM | Freq: Every day | CUTANEOUS | Status: DC

## 2014-12-09 NOTE — Patient Instructions (Signed)
ANTIBACTERIAL SOAP INSTRUCTIONS  THE DAY AFTER PROCEDURE  Please follow the instructions your doctor has marked.   Shower as usual. Before getting out, place a drop of antibacterial liquid soap (Dial) on a wet, clean washcloth.  Gently wipe washcloth over affected area.  Afterward, rinse the area with warm water.  Blot the area dry with a soft cloth and cover with antibiotic ointment (neosporin, polysporin, bacitracin) and band aid or gauze and tape  Place 3-4 drops of antibacterial liquid soap in a quart of warm tap water.  Submerge foot into water for 20 minutes.  If bandage was applied after your procedure, leave on to allow for easy lift off, then remove and continue with soak for the remaining time.  Next, blot area dry with a soft cloth and cover with a bandage. After washing and drying thoroughly apply Silvadene and gauze dressing as instructed maintain daily Silvadene dressing changes until ulcers have healed

## 2014-12-09 NOTE — Progress Notes (Signed)
   Subjective:    Patient ID: Kirsten Gallegos, female    DOB: 1936-08-03, 79 y.o.   MRN: [id number]  HPII STILL HAVE THIS CALLUS ON THE BALL OF MY RIGHT FOOT AND THE LEFT BIG TOE IS STILL RED AND SORE    Review of Systems no new findings or systemic changes noted     Objective:   Physical Exam 79 year old female options this time for follow-up continues to have the with keratoses has developed a recurrence of ulceration sub-second MTP area right foot and distal tuft of left hallux each show proximally a half centimeter diameter ulcer full-thickness through the skin down to subcutaneous tissue level. Mild serous drainage noted on both sides no ascending cellulitis or lymphangitis no purulence no malodor noted. If you've diabetes with neuropathy. Vascular status reveals DP plus one over 4 PT thready to nonpalpable bilateral capillary refill time 4 seconds all digits there is a patient is wearing diabetic shoes however the end of her left hallux being the end of her shoe, patient wears it Germaine Pomfret type shoe with in adequate space for the toe. Nails show thickening dystrophy and crit keratosis however do not he debridement at this point no secondary infection no open wounds no ulcerations       Assessment & Plan:  Assessment diabetic neuropathic ulcers both feet subsecond MTP area right distal tuft of the hallux left these are debrided down to subcutaneous tissue level each about half centimeter in diameter at this time Silvadene gauze dressings are applied are Band-Aid dressing applied to the hallux to foam padding applied to the left hallux as well. We'll get authorization for new diabetic extra depth shoes and follow-up appropriate in the future with in one month for palliative nail care and likely measurement and molding for new shoes. Contact us visiting changes or exacerbations occur in the interim. Prescription for Silvadene is issued at this time.  Alvan Dame DPM

## 2015-01-12 ENCOUNTER — Ambulatory Visit (INDEPENDENT_AMBULATORY_CARE_PROVIDER_SITE_OTHER): Payer: Medicare Other

## 2015-01-12 VITALS — BP 174/84 | HR 61 | Resp 18

## 2015-01-12 DIAGNOSIS — M79676 Pain in unspecified toe(s): Secondary | ICD-10-CM

## 2015-01-12 DIAGNOSIS — L02612 Cutaneous abscess of left foot: Secondary | ICD-10-CM | POA: Diagnosis not present

## 2015-01-12 DIAGNOSIS — L89891 Pressure ulcer of other site, stage 1: Secondary | ICD-10-CM

## 2015-01-12 DIAGNOSIS — E1140 Type 2 diabetes mellitus with diabetic neuropathy, unspecified: Secondary | ICD-10-CM | POA: Diagnosis not present

## 2015-01-12 DIAGNOSIS — B351 Tinea unguium: Secondary | ICD-10-CM

## 2015-01-12 DIAGNOSIS — L03032 Cellulitis of left toe: Secondary | ICD-10-CM | POA: Diagnosis not present

## 2015-01-12 DIAGNOSIS — L97521 Non-pressure chronic ulcer of other part of left foot limited to breakdown of skin: Secondary | ICD-10-CM

## 2015-01-12 MED ORDER — CEPHALEXIN 500 MG PO CAPS: 500.0000 mg | ORAL_CAPSULE | Freq: Three times a day (TID) | ORAL | Status: DC

## 2015-01-12 NOTE — Progress Notes (Signed)
   Subjective:    Patient ID: Kirsten Gallegos, female    DOB: 1936-09-02, 79 y.o.   MRN: [id number]  HPI my left big toe is hurting and is red and i think that the shoe is what is making it hurt    Review of Systems no new findings or systemic changes noted patient developed keratoses distal tuft of the left hallux    Objective:   Physical Exam Lower extremity objective findings reveal pedal pulses palpable DP plus one PT thready nonpalpable bilateral Refill time 3 seconds all digits epicritic appropriate septa sensations grossly dispensed diminished on Semmes Weinstein to the forefoot digits and arch although has hypersensitivity distal tuft of the left great toe and subsecond MTP area right. This tuft of the hallux reveals edema and erythema appears feels a contusion blistering of the end of the toe with some mild exfoliation of skin there is no malodor no ascending size lymphangitis no purulence however erythematous ulcer is identified approximately 3-4 mm diameter ulcer down to dermal subdermal junction is present surrounding localized cellulitis no ascending cellulitis or lymphangitis noted no systemic signs of infection patient does have rigid contractures of toes and and hallux being rigid does have some deformities of digits requiring appropriate accommodative shoes still waiting authorization for new diabetic shoes.       Assessment & Plan:  Assessment ulcer left great toe with localized cellulitis this time patient placed back on a regimen of antibiotics cephalexin 500 mg 3 times a day 10 days the areas debrided and cleansed with all cleansed Silvadene and gauze dressing are Band-Aid dressing are applied do Silvadene dressing changes or MI arterial soap and Neosporin and Band-Aid dressing changes daily. Patient is also given some tube foam pads to cushion the end of the toe prevent contusion or recurrence of ulceration. Waiting authorization for new shoes which will also be helpful in the  future. Recheck ulcer site in 2 weeks  Alvan Dame DPM

## 2015-01-12 NOTE — Patient Instructions (Signed)

## 2015-02-03 ENCOUNTER — Ambulatory Visit: Payer: Medicare Other

## 2015-02-03 ENCOUNTER — Telehealth: Payer: Self-pay

## 2015-02-03 NOTE — Telephone Encounter (Signed)
Kirsten Gallegos rescheduled pt due to Dr. Dionne Bucy illness, pt requested a callback as to how to treat ulcer with cellulitis.  Dr. Ardelle Anton ordered refill Cephalexin 500mg  as prescribed by Dr. and continue the Silvadene cream with daily dressing changes, if condition changes or worsens call for an earlier appt or go to the ER.  I spoke with the pt and she states she will use the Silvadene to the heel, and does not want and antibiotic, that she has an appt with her primary doctor on 02/06/2015 and she will confer with him.  Pt thanked me for the quick callback.

## 2015-02-03 NOTE — Telephone Encounter (Signed)
I called patient to reschedule appointment from today with Dr. Ralene Cork since he is out sick and offered the patient to come to [facility] since he will not be in [place] next week either. Patient is unable to come to [place] due to relying on transportation. I rescheduled her for Thursday 04.14, however she wants to know what she can do in the mean time. She states she is in severe pain and can hardly walk. She also stated that she has cellulitis along with the ulcer. She requested a call back today. Thank you.

## 2015-02-16 ENCOUNTER — Ambulatory Visit: Payer: Medicare Other

## 2015-02-23 ENCOUNTER — Ambulatory Visit (INDEPENDENT_AMBULATORY_CARE_PROVIDER_SITE_OTHER): Payer: Medicare Other | Admitting: Podiatrist

## 2015-02-23 DIAGNOSIS — M216X1 Other acquired deformities of right foot: Secondary | ICD-10-CM

## 2015-02-23 DIAGNOSIS — E1140 Type 2 diabetes mellitus with diabetic neuropathy, unspecified: Secondary | ICD-10-CM | POA: Diagnosis not present

## 2015-02-23 DIAGNOSIS — Q828 Other specified congenital malformations of skin: Secondary | ICD-10-CM

## 2015-02-28 NOTE — Progress Notes (Signed)
   Subjective:   Patient presents for follow up of left great toe ulcer.  She has been using silvadene cream and relates there is pain in the toe.  Denies any systemic signs of infection  Objective:   Physical Exam  Neurovascular status unchanged.  Left hallux ulcer appears improved.   No streaking or lymphangitis is present.  Prominent metatarsal head is present.  Hyperkeratotic tissue is overlying prominent metatarsal     Assessment & Plan:  Assessment ulcer left great toe  Improved, plantar felxed metatarsal with porokeratosis  Plan:  Debridement of the hyperkeratotic lesion is accomplished today wihtout complication. I will see her back in 3 weeks and if there are any changes or concerns she will call.

## 2015-03-23 ENCOUNTER — Ambulatory Visit: Payer: Medicare Other | Admitting: Podiatrist

## 2015-03-30 ENCOUNTER — Encounter: Payer: Self-pay | Admitting: Podiatrist

## 2015-03-30 ENCOUNTER — Ambulatory Visit (INDEPENDENT_AMBULATORY_CARE_PROVIDER_SITE_OTHER): Payer: Medicare Other | Admitting: Podiatrist

## 2015-03-30 VITALS — BP 154/75 | HR 67 | Resp 18

## 2015-03-30 DIAGNOSIS — L97521 Non-pressure chronic ulcer of other part of left foot limited to breakdown of skin: Secondary | ICD-10-CM

## 2015-03-30 DIAGNOSIS — M216X1 Other acquired deformities of right foot: Secondary | ICD-10-CM | POA: Diagnosis not present

## 2015-03-30 DIAGNOSIS — E1140 Type 2 diabetes mellitus with diabetic neuropathy, unspecified: Secondary | ICD-10-CM

## 2015-03-30 NOTE — Progress Notes (Signed)
   Chief Complaint  Patient presents with  . Foot Ulcer    ON MY LEFT BIG TOE AND I AM HERE TO GET MY DIABETIC SHOES     Subjective:   Patient presents for follow up of left great toe ulcer.  She has been using silvadene cream and relates there is some mild pain in the toe.  Denies any systemic signs of infection  Objective:   Physical Exam  Neurovascular status unchanged.  Left hallux ulcer appears superficial and  improved.   Measures 58mm in diameter and is equal with the surrounding skin surface. No streaking or lymphangitis is present.  Prominent metatarsal head is present.  Hyperkeratotic tissue is overlying prominent metatarsal head noted.  No streaking or lymphangitis present. No sign of infection noted.     Assessment & Plan:  Assessment ulcer left great toe  Improved, plantar felxed metatarsal with porokeratosis  Plan:  Debridement of the ulcer is accomplished today and all necrotic tissue is debrided to bleeding and viable tissue.  Recommended offloading the wound as well.  Her diabetic shoes are also dispensed today.  She states they are comfortable and free of defect.  Her old shoes are worn out and likely contributed to the hallux ulcer.  The new shoes should help reduce pressure and help the wound heal.  She will be seen back in 2 weeks for recheck.

## 2015-05-03 ENCOUNTER — Ambulatory Visit: Payer: Medicare Other | Admitting: Podiatry

## 2015-05-10 ENCOUNTER — Ambulatory Visit (INDEPENDENT_AMBULATORY_CARE_PROVIDER_SITE_OTHER): Payer: Medicare Other | Admitting: Podiatry

## 2015-05-10 ENCOUNTER — Encounter: Payer: Self-pay | Admitting: Podiatry

## 2015-05-10 DIAGNOSIS — M79676 Pain in unspecified toe(s): Secondary | ICD-10-CM | POA: Diagnosis not present

## 2015-05-10 DIAGNOSIS — M79675 Pain in left toe(s): Secondary | ICD-10-CM

## 2015-05-10 DIAGNOSIS — M79674 Pain in right toe(s): Secondary | ICD-10-CM

## 2015-05-10 DIAGNOSIS — B351 Tinea unguium: Secondary | ICD-10-CM

## 2015-05-10 NOTE — Progress Notes (Signed)
Patient ID: Kirsten Gallegos, female   DOB: 01-02-1936, 79 y.o.   MRN: [id number] Complaint:  Visit Type: Patient returns to my office for continued preventative foot care services. Complaint: Patient states" my nails have grown long and thick and become painful to walk and wear shoes" Patient has been diagnosed with DM with no foot  complications. He presents for preventative foot care services. No changes to ROS  Podiatric Exam: Vascular: dorsalis pedis and posterior tibial pulses are palpable bilateral. Capillary return is immediate. Temperature gradient is WNL. Skin turgor WNL  Sensorium: Normal Semmes Weinstein monofilament test. Normal tactile sensation bilaterally. Nail Exam: Pt has thick disfigured discolored nails with subungual debris noted bilateral entire nail hallux  Ulcer Exam: There is no evidence of ulcer or pre-ulcerative changes or infection. Orthopedic Exam: Muscle tone and strength are WNL. No limitations in general ROM. No crepitus or effusions noted. Foot type and digits show no abnormalities. Bony prominences are unremarkable. Skin: No Porokeratosis. No infection or ulcers  Diagnosis:  Tinea unguium, Pain in right toe, pain in left toes  Treatment & Plan Procedures and Treatment: Consent by patient was obtained for treatment procedures. The patient understood the discussion of treatment and procedures well. All questions were answered thoroughly reviewed. Debridement of mycotic and hypertrophic toenails, 1 through 5 bilateral and clearing of subungual debris. No ulceration, no infection noted.  Return Visit-Office Procedure: Patient instructed to return to the office for a follow up visit 3 months for continued evaluation and treatment.

## 2015-08-16 ENCOUNTER — Ambulatory Visit: Payer: Medicare Other | Admitting: Sports Medicine

## 2015-08-16 ENCOUNTER — Ambulatory Visit: Payer: Medicare Other | Admitting: Podiatry

## 2015-08-24 ENCOUNTER — Encounter: Payer: Self-pay | Admitting: Sports Medicine

## 2015-08-24 ENCOUNTER — Ambulatory Visit (INDEPENDENT_AMBULATORY_CARE_PROVIDER_SITE_OTHER): Payer: Medicare Other | Admitting: Sports Medicine

## 2015-08-24 DIAGNOSIS — M79605 Pain in left leg: Secondary | ICD-10-CM

## 2015-08-24 DIAGNOSIS — B351 Tinea unguium: Secondary | ICD-10-CM | POA: Diagnosis not present

## 2015-08-24 DIAGNOSIS — M79604 Pain in right leg: Secondary | ICD-10-CM

## 2015-08-24 DIAGNOSIS — M79673 Pain in unspecified foot: Secondary | ICD-10-CM | POA: Diagnosis not present

## 2015-08-24 DIAGNOSIS — E1140 Type 2 diabetes mellitus with diabetic neuropathy, unspecified: Secondary | ICD-10-CM

## 2015-08-24 NOTE — Progress Notes (Signed)
Patient ID: Kirsten Gallegos, female   DOB: Jan 25, 1936, 79 y.o.   MRN: [id number] Subjective: Kirsten Gallegos is a 79 y.o. female patient with history of type 2 diabetes who presents to office today complaining of long, painful nails  while ambulating in shoes; unable to trim. Patient states that the glucose reading this morning was 220 mg/dl. Denies ETOH or Tobacco use. Patient denies any new changes in medication or new problems. Patient denies any new cramping, numbness, burning or tingling in the legs.  Patient Active Problem List   Diagnosis Date Noted  . Mycotic toenails 07/23/2013   Current Outpatient Prescriptions on File Prior to Visit  Medication Sig Dispense Refill  . ALPRAZolam (XANAX) 0.25 MG tablet   0  . carvedilol (COREG) 3.125 MG tablet Take 3.125 mg by mouth 2 (two) times daily with a meal.    . cephALEXin (KEFLEX) 500 MG capsule Take 1 capsule (500 mg total) by mouth 3 (three) times daily. 30 capsule 0  . doxycycline (VIBRA-TABS) 100 MG tablet Take 100 mg by mouth 2 (two) times daily. for 10 days  0  . erythromycin ophthalmic ointment   0  . glimepiride (AMARYL) 2 MG tablet Take 2 mg by mouth 2 (two) times daily.  0  . HYDROcodone-acetaminophen (NORCO) 10-325 MG per tablet Take 1 tablet by mouth every 6 (six) hours as needed.    Marland Kitchen LANTUS SOLOSTAR 100 UNIT/ML Solostar Pen   0  . levothyroxine (SYNTHROID, LEVOTHROID) 75 MCG tablet Take 75 mcg by mouth daily before breakfast.    . metFORMIN (GLUCOPHAGE) 1000 MG tablet Take 1,000 mg by mouth 2 (two) times daily with a meal.    . metoprolol (LOPRESSOR) 50 MG tablet Take 50 mg by mouth 2 (two) times daily.    . ondansetron (ZOFRAN) 4 MG tablet   0  . predniSONE (DELTASONE) 10 MG tablet Take 10 mg by mouth daily with breakfast.    . silver sulfADIAZINE (SILVADENE) 1 % cream Apply 1 application topically daily. 50 g 2  . simvastatin (ZOCOR) 20 MG tablet Take 20 mg by mouth daily.    Marland Kitchen sulfamethoxazole-trimethoprim (BACTRIM DS,SEPTRA  DS) 800-160 MG per tablet   0  . TRUETEST TEST test strip   0  . zolpidem (AMBIEN) 10 MG tablet Take 10 mg by mouth at bedtime as needed for sleep.     Current Facility-Administered Medications on File Prior to Visit  Medication Dose Route Frequency Provider Last Rate Last Dose  . triamcinolone acetonide (KENALOG) 10 MG/ML injection 10 mg  10 mg Other Once Alvan Dame, DPM       Allergies  Allergen Reactions  . Cephalexin     Pt states since she had meningitis, she not able to take antibiotic, but did not give reactions.   . Codeine    Labs: HEMOGLOBIN A1C- No recent lab on file  Objective: General: Patient is awake, alert, and oriented x 3 and in no acute distress.  Integument: Skin is warm, dry and supple bilateral. Nails are tender, long, thickened and  dystrophic with subungual debris, consistent with onychomycosis, 1-5 bilateral. At the right big toenail medial aspect there was small amount of trapped drainage that was present after trimming the nail with no redness or swelling. No open lesions or preulcerative lesions present bilateral. Remaining integument unremarkable.  Vasculature:  Dorsalis Pedis pulse 1/4 bilateral. Posterior Tibial pulse  1/4 bilateral.  Capillary fill time <3 sec 1-5 bilateral. Scant hair growth to the level of  the digits. Temperature gradient within normal limits. + varicosities present bilateral. No edema present bilateral.   Neurology: The patient has intact sensation measured with a 5.07/10g Semmes Weinstein Monofilament at all pedal sites bilateral . Vibratory sensation diminished bilateral with tuning fork. No Babinski sign present bilateral.   Musculoskeletal: Asymptomatic bunion and hammertoes noted bilateral. Muscular strength 5/5 in all lower extremity muscular groups bilateral without pain or limitation on range of motion . No tenderness with calf compression bilateral.  Assessment and Plan: Problem List Items Addressed This Visit    None     Visit Diagnoses    Onychomycosis    -  Primary    Pain in both lower extremities        Type 2 diabetes, controlled, with neuropathy (HCC)          -Examined patient. -Discussed and educated patient on diabetic foot care, especially with  regards to the vascular, neurological and musculoskeletal systems.  -Stressed the importance of good glycemic control and the detriment of not  controlling glucose levels in relation to the foot. -Mechanically debrided all nails 1-5 bilateral using sterile nail nipper and filed with dremel without incident. Applied slivadene cream and bandaid to Right big toe for likely early paronychia that was treated today with aggressive trim/slant back; Advise patient to soak with epsom salt 15-20 mins daily and then apply topical antibiotic and bandaid; Advise to monitor of worsening changes/signs of infection -Answered all patient questions -Patient to return in 3 months for at risk foot care or sooner if condition worsens -Patient advised to call the office if any problems or questions arise in the  Meantime.  Asencion Islam, DPM

## 2015-11-08 ENCOUNTER — Encounter: Payer: Self-pay | Admitting: Sports Medicine

## 2015-11-08 ENCOUNTER — Ambulatory Visit (INDEPENDENT_AMBULATORY_CARE_PROVIDER_SITE_OTHER): Payer: Medicare Other | Admitting: Sports Medicine

## 2015-11-08 DIAGNOSIS — M79676 Pain in unspecified toe(s): Secondary | ICD-10-CM

## 2015-11-08 DIAGNOSIS — L02619 Cutaneous abscess of unspecified foot: Secondary | ICD-10-CM | POA: Diagnosis not present

## 2015-11-08 DIAGNOSIS — M779 Enthesopathy, unspecified: Secondary | ICD-10-CM | POA: Diagnosis not present

## 2015-11-08 DIAGNOSIS — B351 Tinea unguium: Secondary | ICD-10-CM | POA: Diagnosis not present

## 2015-11-08 DIAGNOSIS — L03119 Cellulitis of unspecified part of limb: Secondary | ICD-10-CM

## 2015-11-08 DIAGNOSIS — E11621 Type 2 diabetes mellitus with foot ulcer: Secondary | ICD-10-CM

## 2015-11-08 DIAGNOSIS — L97519 Non-pressure chronic ulcer of other part of right foot with unspecified severity: Secondary | ICD-10-CM

## 2015-11-08 DIAGNOSIS — E1140 Type 2 diabetes mellitus with diabetic neuropathy, unspecified: Secondary | ICD-10-CM

## 2015-11-08 MED ORDER — DOXYCYCLINE HYCLATE 100 MG PO TABS: 100.0000 mg | ORAL_TABLET | Freq: Two times a day (BID) | ORAL | Status: DC

## 2015-11-08 MED ORDER — TRIAMCINOLONE ACETONIDE 10 MG/ML IJ SUSP: 10.0000 mg | Freq: Once | INTRAMUSCULAR | Status: DC

## 2015-11-08 NOTE — Progress Notes (Signed)
Patient ID: Kirsten Gallegos, female   DOB: 1936-06-18, 80 y.o.   MRN: [id number]  Subjective: Kirsten Gallegos is a 80 y.o. female patient with history of type 2 diabetes who returns to office today complaining of long, painful nails  while ambulating in shoes; unable to trim. Patient also admits to pain underneath the ball of the right foot at area of callus and has pain to lateral side of her foot which is new. Patient states that the glucose reading this morning was 200 mg/dl.. Patient denies any new changes in medication or new problems. Patient denies any new cramping, numbness, burning or tingling in the legs.  Patient Active Problem List   Diagnosis Date Noted  . Mycotic toenails 07/23/2013   Current Outpatient Prescriptions on File Prior to Visit  Medication Sig Dispense Refill  . ALPRAZolam (XANAX) 0.25 MG tablet   0  . carvedilol (COREG) 3.125 MG tablet Take 3.125 mg by mouth 2 (two) times daily with a meal.    . cephALEXin (KEFLEX) 500 MG capsule Take 1 capsule (500 mg total) by mouth 3 (three) times daily. 30 capsule 0  . doxycycline (VIBRA-TABS) 100 MG tablet Take 100 mg by mouth 2 (two) times daily. for 10 days  0  . erythromycin ophthalmic ointment   0  . glimepiride (AMARYL) 2 MG tablet Take 2 mg by mouth 2 (two) times daily.  0  . HYDROcodone-acetaminophen (NORCO) 10-325 MG per tablet Take 1 tablet by mouth every 6 (six) hours as needed.    Marland Kitchen LANTUS SOLOSTAR 100 UNIT/ML Solostar Pen   0  . levothyroxine (SYNTHROID, LEVOTHROID) 75 MCG tablet Take 75 mcg by mouth daily before breakfast.    . metFORMIN (GLUCOPHAGE) 1000 MG tablet Take 1,000 mg by mouth 2 (two) times daily with a meal.    . metoprolol (LOPRESSOR) 50 MG tablet Take 50 mg by mouth 2 (two) times daily.    . ondansetron (ZOFRAN) 4 MG tablet   0  . predniSONE (DELTASONE) 10 MG tablet Take 10 mg by mouth daily with breakfast.    . silver sulfADIAZINE (SILVADENE) 1 % cream Apply 1 application topically daily. 50 g 2  .  simvastatin (ZOCOR) 20 MG tablet Take 20 mg by mouth daily.    Marland Kitchen sulfamethoxazole-trimethoprim (BACTRIM DS,SEPTRA DS) 800-160 MG per tablet   0  . TRUETEST TEST test strip   0  . zolpidem (AMBIEN) 10 MG tablet Take 10 mg by mouth at bedtime as needed for sleep.     Current Facility-Administered Medications on File Prior to Visit  Medication Dose Route Frequency Provider Last Rate Last Dose  . triamcinolone acetonide (KENALOG) 10 MG/ML injection 10 mg  10 mg Other Once Alvan Dame, DPM       Allergies  Allergen Reactions  . Cephalexin     Pt states since she had meningitis, she not able to take antibiotic, but did not give reactions.   . Codeine    Labs: HEMOGLOBIN A1C- No recent lab on file  Objective: General: Patient is awake, alert, and oriented x 3 and in no acute distress.  Integument: Skin is warm, dry and supple bilateral. Nails are tender, long, thickened and  dystrophic with subungual debris, consistent with onychomycosis, 1-5 bilateral. Plantar right forefoot hemorraghic callus once debrided and incised and drained purelent drainage was expressed with an underlying ulcerated bed granular in nature that measures 0.5x0.5cm with probing 0.3cm deep to soft tissue end range. Mild edema and erythema to site suggestive  of abscess. No odor. No fluctuance. No other acute signs of infection.  No other open lesions or preulcerative lesions present bilateral. Remaining integument unremarkable.  Vasculature:  Dorsalis Pedis pulse 1/4 bilateral. Posterior Tibial pulse  1/4 bilateral.  Capillary fill time <3 sec 1-5 bilateral. Scant hair growth to the level of the digits. Temperature gradient within normal limits. + varicosities present bilateral. No edema present bilateral.   Neurology: The patient has intact sensation measured with a 5.07/10g Semmes Weinstein Monofilament at all pedal sites bilateral. Vibratory sensation diminished bilateral with tuning fork. No Babinski sign present  bilateral.   Musculoskeletal: Asymptomatic bunion and hammertoes noted bilateral. Pain with palpation to right 5th metatarsal base at insertion of Peroneus brevis which is likely compensation for pain/overuse since patient has been hurting at ball of right foot Muscular strength 5/5 in all lower extremity muscular groups bilateral without pain or limitation on range of motion . No tenderness with calf compression bilateral.  Assessment and Plan: Problem List Items Addressed This Visit    None    Visit Diagnoses    Onychomycosis    -  Primary    Type 2 diabetes, controlled, with neuropathy (HCC)        Pain of toe, unspecified laterality        Diabetic ulcer of right foot associated with type 2 diabetes mellitus (HCC)        Relevant Orders    Wound culture    Cellulitis and abscess of foot, except toes        Relevant Medications    doxycycline (VIBRA-TABS) 100 MG tablet    Other Relevant Orders    Wound culture    Tendonitis        Relevant Medications    triamcinolone acetonide (KENALOG) 10 MG/ML injection 10 mg      -Examined patient. -Discussed and educated patient on diabetic foot care, especially with  regards to the vascular, neurological and musculoskeletal systems.  -Stressed the importance of good glycemic control and the detriment of not  controlling glucose levels in relation to the foot. -Mechanically debrided all nails 1-5 bilateral using sterile nail nipper and filed with dremel without incident. -I&D with ulcer debridement performed at ball of right foot using sterile chisel blade and wound culture was taken and sent; dressed area with offloading pad, silvadene, 2x2, coban; patient to have son to do the same at home -Dispensed post op shoe right foot  -Rx Doxycycline for infection -After oral consent and aseptic prep, injected a mixture containing 1 ml of 2%  plain lidocaine, 1 ml 0.5% plain marcaine, 0.5 ml of kenalog 10 and 0.5 ml of dexamethasone phosphate into  right PB sheath without complication. Post-injection care discussed with patient.  -Answered all patient questions -Patient to return in 1 week for ulcer/abscess site re-check or sooner if condition worsens. Will consider xray if fails to improve.  -Patient advised to call the office or go to ER if the area worsens or if any problems arise.   Asencion Islam, DPM

## 2015-11-15 ENCOUNTER — Ambulatory Visit: Payer: Medicare Other | Admitting: Sports Medicine

## 2015-11-28 ENCOUNTER — Encounter: Payer: Self-pay | Admitting: Sports Medicine

## 2015-11-29 ENCOUNTER — Ambulatory Visit: Payer: Medicare Other | Admitting: Sports Medicine

## 2015-11-29 ENCOUNTER — Ambulatory Visit (INDEPENDENT_AMBULATORY_CARE_PROVIDER_SITE_OTHER): Payer: Medicare Other

## 2015-11-29 ENCOUNTER — Ambulatory Visit (INDEPENDENT_AMBULATORY_CARE_PROVIDER_SITE_OTHER): Payer: Medicare Other | Admitting: Sports Medicine

## 2015-11-29 ENCOUNTER — Encounter: Payer: Self-pay | Admitting: Sports Medicine

## 2015-11-29 DIAGNOSIS — L89891 Pressure ulcer of other site, stage 1: Secondary | ICD-10-CM

## 2015-11-29 DIAGNOSIS — M79671 Pain in right foot: Secondary | ICD-10-CM | POA: Diagnosis not present

## 2015-11-29 DIAGNOSIS — E11621 Type 2 diabetes mellitus with foot ulcer: Secondary | ICD-10-CM

## 2015-11-29 DIAGNOSIS — L97519 Non-pressure chronic ulcer of other part of right foot with unspecified severity: Secondary | ICD-10-CM

## 2015-11-29 DIAGNOSIS — L02619 Cutaneous abscess of unspecified foot: Secondary | ICD-10-CM

## 2015-11-29 DIAGNOSIS — E1140 Type 2 diabetes mellitus with diabetic neuropathy, unspecified: Secondary | ICD-10-CM

## 2015-11-29 DIAGNOSIS — L03119 Cellulitis of unspecified part of limb: Secondary | ICD-10-CM

## 2015-11-29 NOTE — Progress Notes (Signed)
Patient ID: Kirsten Gallegos, female   DOB: Oct 28, 1936, 80 y.o.   MRN: [id number]  Subjective: Kirsten Gallegos is a 80 y.o. female patient with history of type 2 diabetes who returns to office today for follow up evaluation of foot ulcer, + MSSA culture on Doxycycline that was just completed. Patient states that the glucose reading this morning was 200 mg/dl.. Patient denies constitutional symptoms, any new changes in medication, or new problems. Patient denies any new cramping, numbness, burning or tingling in the legs.  Patient Active Problem List   Diagnosis Date Noted  . Mycotic toenails 07/23/2013   Current Outpatient Prescriptions on File Prior to Visit  Medication Sig Dispense Refill  . ALPRAZolam (XANAX) 0.25 MG tablet   0  . carvedilol (COREG) 3.125 MG tablet Take 3.125 mg by mouth 2 (two) times daily with a meal.    . cephALEXin (KEFLEX) 500 MG capsule Take 1 capsule (500 mg total) by mouth 3 (three) times daily. 30 capsule 0  . doxycycline (VIBRA-TABS) 100 MG tablet Take 1 tablet (100 mg total) by mouth 2 (two) times daily. for 10 days 20 tablet 0  . erythromycin ophthalmic ointment   0  . glimepiride (AMARYL) 2 MG tablet Take 2 mg by mouth 2 (two) times daily.  0  . HYDROcodone-acetaminophen (NORCO) 10-325 MG per tablet Take 1 tablet by mouth every 6 (six) hours as needed.    Marland Kitchen LANTUS SOLOSTAR 100 UNIT/ML Solostar Pen   0  . levothyroxine (SYNTHROID, LEVOTHROID) 75 MCG tablet Take 75 mcg by mouth daily before breakfast.    . metFORMIN (GLUCOPHAGE) 1000 MG tablet Take 1,000 mg by mouth 2 (two) times daily with a meal.    . metoprolol (LOPRESSOR) 50 MG tablet Take 50 mg by mouth 2 (two) times daily.    . ondansetron (ZOFRAN) 4 MG tablet   0  . predniSONE (DELTASONE) 10 MG tablet Take 10 mg by mouth daily with breakfast.    . silver sulfADIAZINE (SILVADENE) 1 % cream Apply 1 application topically daily. 50 g 2  . simvastatin (ZOCOR) 20 MG tablet Take 20 mg by mouth daily.    Marland Kitchen  sulfamethoxazole-trimethoprim (BACTRIM DS,SEPTRA DS) 800-160 MG per tablet   0  . TRUETEST TEST test strip   0  . zolpidem (AMBIEN) 10 MG tablet Take 10 mg by mouth at bedtime as needed for sleep.     Current Facility-Administered Medications on File Prior to Visit  Medication Dose Route Frequency Provider Last Rate Last Dose  . triamcinolone acetonide (KENALOG) 10 MG/ML injection 10 mg  10 mg Other Once Alvan Dame, DPM      . triamcinolone acetonide (KENALOG) 10 MG/ML injection 10 mg  10 mg Other Once Asencion Islam, DPM       Allergies  Allergen Reactions  . Cephalexin     Pt states since she had meningitis, she not able to take antibiotic, but did not give reactions.   . Codeine     Objective: General: Patient is awake, alert, and oriented x 3 and in no acute distress.  Integument: Skin is warm, dry and supple bilateral. Nails are short and thickened, consistent with onychomycosis, 1-5 bilateral. Plantar right forefoot full thickness ulcer granular in nature that measures post debridement 1 x 0.3x0.3cm  (last measurement 0.5x0.5cmx0.3cm) probing 0.3cm deep to soft tissue end range. No edema and erythema to site . No odor. No fluctuance. No other acute signs of infection.  No other open lesions or + preulcerative  lesion to right 5th mtpj and right heel with no signs of infection. Remaining integument unremarkable.  Vasculature:  Dorsalis Pedis pulse 1/4 bilateral. Posterior Tibial pulse  1/4 bilateral.  Capillary fill time <3 sec 1-5 bilateral. Scant hair growth to the level of the digits. Temperature gradient within normal limits. + varicosities present bilateral. No edema present bilateral.   Neurology: The patient has intact sensation measured with a 5.07/10g Semmes Weinstein Monofilament at all pedal sites bilateral. Vibratory sensation diminished bilateral with tuning fork. No Babinski sign present bilateral.   Musculoskeletal: Asymptomatic bunion and hammertoes noted bilateral.  Muscular strength 5/5 in all lower extremity muscular groups bilateral without pain or limitation on range of motion . No tenderness with calf compression bilateral.  X-ray, right foot. Mild decrease in osseous mineralization, no extension of ulceration deep to bone or bony destruction concerning of osteomyelitis. No soft tissue emphysema no acute fracture or dislocation. There is bunion and hammertoe deformity and subtalar joint screw that is intact. Soft tissues within normal limits. No other acute findings.  Assessment and Plan: Problem List Items Addressed This Visit    None    Visit Diagnoses    Right foot pain    -  Primary    Relevant Orders    DG Foot 2 Views Right    Diabetic ulcer of right foot associated with type 2 diabetes mellitus (HCC)        Cellulitis and abscess of foot, except toes        Type 2 diabetes, controlled, with neuropathy (HCC)          -Examined patient. -xrays reviewed -Discussed and educated patient on diabetic foot care, especially with  regards to the vascular, neurological and musculoskeletal systems.  -Stressed the importance of good glycemic control and the detriment of not  controlling glucose levels in relation to the foot. Mechanically debrided ulceration to ball of right foot; dressed area with offloading pad, silvadene, 2x2, coban; patient to have son to do the same at home -Advised patient to closely monitor pre-ulcerative areas and to carefully offload the foot when lying in bed or sitting at rest to prevent further skin breakdown -Continue with post op shoe right foot  -Doxicycline completed -Answered all patient questions -Patient to return in 2week for ulcer re-check or sooner if condition worsens.  -Patient advised to call the office or go to ER if the area worsens or if any problems arise.   Asencion Islam, DPM

## 2015-12-14 ENCOUNTER — Ambulatory Visit: Payer: Medicare Other | Admitting: Sports Medicine

## 2015-12-21 ENCOUNTER — Ambulatory Visit (INDEPENDENT_AMBULATORY_CARE_PROVIDER_SITE_OTHER): Payer: Medicare Other | Admitting: Sports Medicine

## 2015-12-21 ENCOUNTER — Encounter: Payer: Self-pay | Admitting: Sports Medicine

## 2015-12-21 DIAGNOSIS — L89891 Pressure ulcer of other site, stage 1: Secondary | ICD-10-CM | POA: Diagnosis not present

## 2015-12-21 DIAGNOSIS — R58 Hemorrhage, not elsewhere classified: Secondary | ICD-10-CM

## 2015-12-21 DIAGNOSIS — L97511 Non-pressure chronic ulcer of other part of right foot limited to breakdown of skin: Secondary | ICD-10-CM

## 2015-12-21 DIAGNOSIS — E1142 Type 2 diabetes mellitus with diabetic polyneuropathy: Secondary | ICD-10-CM

## 2015-12-21 DIAGNOSIS — M79671 Pain in right foot: Secondary | ICD-10-CM

## 2015-12-21 NOTE — Progress Notes (Addendum)
Patient ID: Kirsten Gallegos, female   DOB: [birth year], 80 y.o.   MRN: [id number]  Subjective: Kirsten Gallegos is a 80 y.o. female patient with history of type 2 diabetes who returns to office today for follow up evaluation of foot ulcer, Patient states that the glucose reading this morning was 200 mg/dl and has been high sometimes even 300. Patient denies constitutional symptoms, any new changes in medication, or new problems. Patient denies any new cramping, numbness, burning or tingling in the legs.  Admits to bruised area to the side of her right foot likely from ill fitting shoes. No other signs of infection.   Patient Active Problem List   Diagnosis Date Noted  . Mycotic toenails 07/23/2013   Current Outpatient Prescriptions on File Prior to Visit  Medication Sig Dispense Refill  . ALPRAZolam (XANAX) 0.25 MG tablet   0  . carvedilol (COREG) 3.125 MG tablet Take 3.125 mg by mouth 2 (two) times daily with a meal.    . cephALEXin (KEFLEX) 500 MG capsule Take 1 capsule (500 mg total) by mouth 3 (three) times daily. 30 capsule 0  . doxycycline (VIBRA-TABS) 100 MG tablet Take 1 tablet (100 mg total) by mouth 2 (two) times daily. for 10 days 20 tablet 0  . erythromycin ophthalmic ointment   0  . glimepiride (AMARYL) 2 MG tablet Take 2 mg by mouth 2 (two) times daily.  0  . HYDROcodone-acetaminophen (NORCO) 10-325 MG per tablet Take 1 tablet by mouth every 6 (six) hours as needed.    Marland Kitchen LANTUS SOLOSTAR 100 UNIT/ML Solostar Pen   0  . levothyroxine (SYNTHROID, LEVOTHROID) 75 MCG tablet Take 75 mcg by mouth daily before breakfast.    . metFORMIN (GLUCOPHAGE) 1000 MG tablet Take 1,000 mg by mouth 2 (two) times daily with a meal.    . metoprolol (LOPRESSOR) 50 MG tablet Take 50 mg by mouth 2 (two) times daily.    . ondansetron (ZOFRAN) 4 MG tablet   0  . predniSONE (DELTASONE) 10 MG tablet Take 10 mg by mouth daily with breakfast.    . silver sulfADIAZINE (SILVADENE) 1 % cream Apply 1 application  topically daily. 50 g 2  . simvastatin (ZOCOR) 20 MG tablet Take 20 mg by mouth daily.    Marland Kitchen sulfamethoxazole-trimethoprim (BACTRIM DS,SEPTRA DS) 800-160 MG per tablet   0  . TRUETEST TEST test strip   0  . zolpidem (AMBIEN) 10 MG tablet Take 10 mg by mouth at bedtime as needed for sleep.     Current Facility-Administered Medications on File Prior to Visit  Medication Dose Route Frequency Provider Last Rate Last Dose  . triamcinolone acetonide (KENALOG) 10 MG/ML injection 10 mg  10 mg Other Once Alvan Dame, DPM      . triamcinolone acetonide (KENALOG) 10 MG/ML injection 10 mg  10 mg Other Once Asencion Islam, DPM       Allergies  Allergen Reactions  . Cephalexin     Pt states since she had meningitis, she not able to take antibiotic, but did not give reactions.   . Codeine     Objective: General: Patient is awake, alert, and oriented x 3 and in no acute distress.  Integument: Skin is warm, dry and supple bilateral. Nails are short and thickened, consistent with onychomycosis, 1-5 bilateral. Plantar right forefoot now partial thickness ulcer granular in nature that measures post debridement 0.8x0.2x0.2cm (last measurement1 x 0.3x0.3cm) with no probling. No edema and erythema to site . No odor. No  fluctuance. No other acute signs of infection.  No other open lesions or + preulcerative lesion/ecchymosis to right 5th mtpj and right heel with no signs of infection. Remaining integument unremarkable.  Vasculature:  Dorsalis Pedis pulse 1/4 bilateral. Posterior Tibial pulse  1/4 bilateral.  Capillary fill time <3 sec 1-5 bilateral. Scant hair growth to the level of the digits. Temperature gradient within normal limits. + varicosities present bilateral. No edema present bilateral.   Neurology: The patient has intact sensation measured with a 5.07/10g Semmes Weinstein Monofilament at all pedal sites bilateral. Vibratory sensation diminished bilateral with tuning fork. No Babinski sign present  bilateral.   Musculoskeletal: Asymptomatic bunion and hammertoes noted bilateral. Muscular strength 5/5 in all lower extremity muscular groups bilateral without pain or limitation on range of motion . No tenderness with calf compression bilateral.  Assessment and Plan: Problem List Items Addressed This Visit    None    Visit Diagnoses    Right foot pain    -  Primary    Right foot ulcer, limited to breakdown of skin (HCC)        Diabetic polyneuropathy associated with type 2 diabetes mellitus (HCC)        Ecchymosis        right heel and lateral 5th MTPJ       -Examined patient. -Discussed and educated patient on diabetic foot care, especially with  regards to the vascular, neurological and musculoskeletal systems.  -Stressed the importance of good glycemic control and the detriment of not  controlling glucose levels in relation to the foot. Mechanically debrided ulceration to ball of right foot using a sterile chisel blade without incident; dressed area with offloading pad, silvadene, 2x2, coban; patient to have son to do the same at home.  Also applied antibiotic cream to right lateral foot area of ecchymosis preventive measures against infection. Advised patient to soak with Epson salt twice weekly prior to dressing changes -Advised patient to closely monitor pre-ulcerative areas and to carefully offload the foot when lying in bed or sitting at rest to prevent further skin breakdown -Continue with post op shoe right foot;  A new shoe given with extra felt padding applied to prevent any skin irritation -Answered all patient questions -Patient to return in 2 weeks for ulcer re-check or sooner if condition worsens.  -Patient advised to call the office or go to ER if the area worsens or if any problems arise.   Asencion Islam, DPM

## 2016-01-04 ENCOUNTER — Ambulatory Visit (INDEPENDENT_AMBULATORY_CARE_PROVIDER_SITE_OTHER): Payer: Medicare Other | Admitting: Sports Medicine

## 2016-01-04 ENCOUNTER — Encounter: Payer: Self-pay | Admitting: Sports Medicine

## 2016-01-04 ENCOUNTER — Telehealth: Payer: Self-pay | Admitting: *Deleted

## 2016-01-04 DIAGNOSIS — L89891 Pressure ulcer of other site, stage 1: Secondary | ICD-10-CM

## 2016-01-04 DIAGNOSIS — M79671 Pain in right foot: Secondary | ICD-10-CM

## 2016-01-04 DIAGNOSIS — L97511 Non-pressure chronic ulcer of other part of right foot limited to breakdown of skin: Secondary | ICD-10-CM

## 2016-01-04 DIAGNOSIS — E1142 Type 2 diabetes mellitus with diabetic polyneuropathy: Secondary | ICD-10-CM

## 2016-01-04 NOTE — Progress Notes (Signed)
Patient ID: Kirsten Gallegos, female   DOB: 1936/07/09, 80 y.o.   MRN: [id number]  Subjective: Kirsten Gallegos is a 80 y.o. female patient with history of type 2 diabetes who returns to office today for follow up evaluation of right foot ulcer, Patient states that the glucose reading this morning was 200 mg/dl and has been high went to ER for 600 blood sugar of which was brought down to 300. Patient denies constitutional symptoms, any new changes in medication, or new problems. Patient denies any new cramping, numbness, burning or tingling in the legs.   Patient Active Problem List   Diagnosis Date Noted  . Mycotic toenails 07/23/2013   Current Outpatient Prescriptions on File Prior to Visit  Medication Sig Dispense Refill  . ALPRAZolam (XANAX) 0.25 MG tablet   0  . carvedilol (COREG) 3.125 MG tablet Take 3.125 mg by mouth 2 (two) times daily with a meal.    . cephALEXin (KEFLEX) 500 MG capsule Take 1 capsule (500 mg total) by mouth 3 (three) times daily. 30 capsule 0  . doxycycline (VIBRA-TABS) 100 MG tablet Take 1 tablet (100 mg total) by mouth 2 (two) times daily. for 10 days 20 tablet 0  . erythromycin ophthalmic ointment   0  . glimepiride (AMARYL) 2 MG tablet Take 2 mg by mouth 2 (two) times daily.  0  . HYDROcodone-acetaminophen (NORCO) 10-325 MG per tablet Take 1 tablet by mouth every 6 (six) hours as needed.    Marland Kitchen LANTUS SOLOSTAR 100 UNIT/ML Solostar Pen   0  . levothyroxine (SYNTHROID, LEVOTHROID) 75 MCG tablet Take 75 mcg by mouth daily before breakfast.    . metFORMIN (GLUCOPHAGE) 1000 MG tablet Take 1,000 mg by mouth 2 (two) times daily with a meal.    . metoprolol (LOPRESSOR) 50 MG tablet Take 50 mg by mouth 2 (two) times daily.    . ondansetron (ZOFRAN) 4 MG tablet   0  . predniSONE (DELTASONE) 10 MG tablet Take 10 mg by mouth daily with breakfast.    . silver sulfADIAZINE (SILVADENE) 1 % cream Apply 1 application topically daily. 50 g 2  . simvastatin (ZOCOR) 20 MG tablet Take  20 mg by mouth daily.    Marland Kitchen sulfamethoxazole-trimethoprim (BACTRIM DS,SEPTRA DS) 800-160 MG per tablet   0  . TRUETEST TEST test strip   0  . zolpidem (AMBIEN) 10 MG tablet Take 10 mg by mouth at bedtime as needed for sleep.     Current Facility-Administered Medications on File Prior to Visit  Medication Dose Route Frequency Provider Last Rate Last Dose  . triamcinolone acetonide (KENALOG) 10 MG/ML injection 10 mg  10 mg Other Once Kirsten Gallegos, DPM      . triamcinolone acetonide (KENALOG) 10 MG/ML injection 10 mg  10 mg Other Once Kirsten Gallegos, DPM       Allergies  Allergen Reactions  . Cephalexin     Pt states since she had meningitis, she not able to take antibiotic, but did not give reactions.   . Codeine     Objective: General: Patient is awake, alert, and oriented x 3 and in no acute distress.  Integument: Skin is warm, dry and supple bilateral. Nails are short and thickened, consistent with onychomycosis, 1-5 bilateral.   Plantar right forefoot partial thickness ulcer sub met 2 granular in nature that measures post debridement 0.8x0.2x0.2cm (last measurement1 x 0.3x0.3cm) with no probing, unchanged from prior. No edema and erythema to site. No odor. No fluctuance. No other acute  signs of infection.   Posterior right heel partial thickness ulcer fibrogranular in nature that measures post-debridement 0.4x0.5x0.1cm with no probing, no edema, no erythema, no odor, no fluctuance, no acute signs of infection.  Lateral fifth metatarsophalangeal joint, right partial thickness ulceration fibro-granular in nature that measures post a brought met 0.4 x 0.5 x 0.1 cm with no probing, no edema, no erythema, no odor, no fluctuance, no acute signs of infection.   Vasculature:  Dorsalis Pedis pulse 1/4 bilateral. Posterior Tibial pulse  1/4 bilateral.  Capillary fill time <3 sec 1-5 bilateral. Scant hair growth to the level of the digits.Temperature gradient within normal limits. +  varicosities present bilateral. No edema present bilateral.   Neurology: The patient has intact sensation measured with a 5.07/10g Semmes Weinstein Monofilament at all pedal sites bilateral. Vibratory sensation diminished bilateral with tuning fork. No Babinski sign present bilateral.   Musculoskeletal: Asymptomatic bunion and hammertoes noted bilateral. Muscular strength 5/5 in all lower extremity muscular groups bilateral without pain or limitation on range of motion . No tenderness with calf compression bilateral.  Assessment and Plan: Problem List Items Addressed This Visit    None    Visit Diagnoses    Right foot ulcer, limited to breakdown of skin ([place])    -  Primary    sub met 2, heel, and lateral 5th MTPJ    Diabetic polyneuropathy associated with type 2 diabetes mellitus (HCC)        Right foot pain           -Examined patient. -Discussed and educated patient on diabetic foot care, especially with  regards to the vascular, neurological and musculoskeletal systems.  -Stressed the importance of good glycemic control and the detriment of not  controlling glucose levels in relation to the foot. Mechanically debrided ulceration to ball of right foot, posterior heel, and fifth metatarsophalangeal joint on right foot using a sterile chisel blade without incident; dressed area with offloading pad, silvadene, 2x2, coban at forefoot and mepilex border to heel; patient to have son to do the same at home. A request was made to order Mepilex border from [facility] to be mailed to patient.  -Advised patient to closely monitor pre-ulcerative areas and to carefully offload the foot when lying in bed or sitting at rest to prevent further skin breakdown -Continue with post op shoe right foot with felt padding -A request was made for grafix to be used at next encounter to assist with healing these ulcerations that have been present for greater than 4 weeks and have failed to resolve with conservative  care -Answered all patient questions -Patient to return in 2 weeks for ulcer re-check or sooner if condition worsens.  -Patient advised to call the office or go to ER if the area worsens or if any problems arise.   Kirsten Gallegos, DPM

## 2016-01-04 NOTE — Telephone Encounter (Addendum)
-----   Message from Asencion Islam, [place] sent at 01/04/2016  8:46 AM EST ----- Regarding: Grafix Grafix for Right foot ulcer. Thanks Dr. Marylene Land. 01/04/2016- REQUIRED GRAFIX FORM, pt clinicals and demographics faxed.  01/05/2016-DR. STOVER ORDERED MEPILEX 4x4 from [facility]. Faxed to [facility].

## 2016-01-05 NOTE — Telephone Encounter (Signed)
-----   Message from [place], [place] sent at 01/04/2016  5:30 PM EST ----- Regarding: Mepilex Border Hi  Can we order Mepilex border 4x4 dressing through [facility] to be sent to patient qty 20 to use on her right heel ulceration which measures 0.4x0.5x0.1cm. Thanks Dr. Marylene Land

## 2016-01-17 ENCOUNTER — Encounter: Payer: Self-pay | Admitting: Sports Medicine

## 2016-01-17 ENCOUNTER — Ambulatory Visit: Payer: Medicare Other | Admitting: Sports Medicine

## 2016-01-17 ENCOUNTER — Telehealth: Payer: Self-pay | Admitting: Sports Medicine

## 2016-01-17 NOTE — Telephone Encounter (Signed)
Patient called earlier to office to cancel appointment for today. States that she Just got out of the hospital. States that she was admitted for UTI and had a blood sugar of 600 and low oxygenation levels; spent 5 days in the hospital.  I returned call to check on patient; patient states that she is doing okay since discharge however, still has a fever of around 101;  I advised patient to take Tylenol and if fever persists then to return to emergency room for evaluation. Patient expressed understanding. I encouraged patient to follow-up in office as soon as she can for re-evaluation of foot ulcer.  In the meantime advised patient to continue with daily dressing changes and to monitor ulcer for any acute changes. If occurs go to emergency room immediately. Patient expressed understanding and will make an appointment for follow up care.  -Dr. Marylene Land

## 2016-01-22 ENCOUNTER — Inpatient Hospital Stay (HOSPITAL_COMMUNITY)
Admission: AD | Admit: 2016-01-22 | Discharge: 2016-02-02 | DRG: 166 | Disposition: A | Discharge status: Home Health | Payer: Medicare Other | Source: Other Acute Inpatient Hospital | Attending: Internal Medicine | Admitting: Internal Medicine

## 2016-01-22 ENCOUNTER — Inpatient Hospital Stay (HOSPITAL_COMMUNITY): Payer: Medicare Other

## 2016-01-22 DIAGNOSIS — E039 Hypothyroidism, unspecified: Secondary | ICD-10-CM | POA: Diagnosis present

## 2016-01-22 DIAGNOSIS — M069 Rheumatoid arthritis, unspecified: Secondary | ICD-10-CM | POA: Diagnosis present

## 2016-01-22 DIAGNOSIS — E669 Obesity, unspecified: Secondary | ICD-10-CM | POA: Diagnosis present

## 2016-01-22 DIAGNOSIS — I10 Essential (primary) hypertension: Secondary | ICD-10-CM | POA: Diagnosis present

## 2016-01-22 DIAGNOSIS — D649 Anemia, unspecified: Secondary | ICD-10-CM | POA: Diagnosis present

## 2016-01-22 DIAGNOSIS — Z6827 Body mass index (BMI) 27.0-27.9, adult: Secondary | ICD-10-CM

## 2016-01-22 DIAGNOSIS — R54 Age-related physical debility: Secondary | ICD-10-CM | POA: Diagnosis present

## 2016-01-22 DIAGNOSIS — R0602 Shortness of breath: Secondary | ICD-10-CM

## 2016-01-22 DIAGNOSIS — S2241XA Multiple fractures of ribs, right side, initial encounter for closed fracture: Secondary | ICD-10-CM | POA: Diagnosis present

## 2016-01-22 DIAGNOSIS — I313 Pericardial effusion (noninflammatory): Secondary | ICD-10-CM | POA: Diagnosis present

## 2016-01-22 DIAGNOSIS — R112 Nausea with vomiting, unspecified: Secondary | ICD-10-CM | POA: Diagnosis not present

## 2016-01-22 DIAGNOSIS — M059 Rheumatoid arthritis with rheumatoid factor, unspecified: Secondary | ICD-10-CM | POA: Insufficient documentation

## 2016-01-22 DIAGNOSIS — E1165 Type 2 diabetes mellitus with hyperglycemia: Secondary | ICD-10-CM | POA: Diagnosis present

## 2016-01-22 DIAGNOSIS — J90 Pleural effusion, not elsewhere classified: Secondary | ICD-10-CM | POA: Diagnosis present

## 2016-01-22 DIAGNOSIS — L89612 Pressure ulcer of right heel, stage 2: Secondary | ICD-10-CM | POA: Diagnosis present

## 2016-01-22 DIAGNOSIS — I2699 Other pulmonary embolism without acute cor pulmonale: Secondary | ICD-10-CM | POA: Diagnosis present

## 2016-01-22 DIAGNOSIS — I319 Disease of pericardium, unspecified: Secondary | ICD-10-CM

## 2016-01-22 DIAGNOSIS — N179 Acute kidney failure, unspecified: Secondary | ICD-10-CM | POA: Diagnosis not present

## 2016-01-22 DIAGNOSIS — Z8249 Family history of ischemic heart disease and other diseases of the circulatory system: Secondary | ICD-10-CM | POA: Diagnosis not present

## 2016-01-22 DIAGNOSIS — Z794 Long term (current) use of insulin: Secondary | ICD-10-CM | POA: Diagnosis not present

## 2016-01-22 DIAGNOSIS — G43A1 Cyclical vomiting, intractable: Secondary | ICD-10-CM | POA: Diagnosis not present

## 2016-01-22 DIAGNOSIS — E876 Hypokalemia: Secondary | ICD-10-CM | POA: Diagnosis present

## 2016-01-22 DIAGNOSIS — Z79899 Other long term (current) drug therapy: Secondary | ICD-10-CM

## 2016-01-22 DIAGNOSIS — Z881 Allergy status to other antibiotic agents status: Secondary | ICD-10-CM

## 2016-01-22 DIAGNOSIS — R0902 Hypoxemia: Secondary | ICD-10-CM | POA: Insufficient documentation

## 2016-01-22 DIAGNOSIS — Z4682 Encounter for fitting and adjustment of non-vascular catheter: Secondary | ICD-10-CM | POA: Diagnosis not present

## 2016-01-22 DIAGNOSIS — J9601 Acute respiratory failure with hypoxia: Secondary | ICD-10-CM | POA: Diagnosis present

## 2016-01-22 DIAGNOSIS — L8990 Pressure ulcer of unspecified site, unspecified stage: Secondary | ICD-10-CM | POA: Insufficient documentation

## 2016-01-22 DIAGNOSIS — J948 Other specified pleural conditions: Secondary | ICD-10-CM | POA: Diagnosis not present

## 2016-01-22 DIAGNOSIS — K219 Gastro-esophageal reflux disease without esophagitis: Secondary | ICD-10-CM | POA: Diagnosis present

## 2016-01-22 DIAGNOSIS — I3139 Other pericardial effusion (noninflammatory): Secondary | ICD-10-CM | POA: Insufficient documentation

## 2016-01-22 DIAGNOSIS — T402X5A Adverse effect of other opioids, initial encounter: Secondary | ICD-10-CM | POA: Diagnosis not present

## 2016-01-22 DIAGNOSIS — E1100 Type 2 diabetes mellitus with hyperosmolarity without nonketotic hyperglycemic-hyperosmolar coma (NKHHC): Secondary | ICD-10-CM | POA: Diagnosis not present

## 2016-01-22 DIAGNOSIS — I252 Old myocardial infarction: Secondary | ICD-10-CM

## 2016-01-22 DIAGNOSIS — I2609 Other pulmonary embolism with acute cor pulmonale: Secondary | ICD-10-CM | POA: Diagnosis present

## 2016-01-22 DIAGNOSIS — Z7952 Long term (current) use of systemic steroids: Secondary | ICD-10-CM | POA: Diagnosis not present

## 2016-01-22 DIAGNOSIS — J9811 Atelectasis: Secondary | ICD-10-CM | POA: Diagnosis present

## 2016-01-22 DIAGNOSIS — L89319 Pressure ulcer of right buttock, unspecified stage: Secondary | ICD-10-CM | POA: Diagnosis present

## 2016-01-22 DIAGNOSIS — Z885 Allergy status to narcotic agent status: Secondary | ICD-10-CM | POA: Diagnosis not present

## 2016-01-22 DIAGNOSIS — R1110 Vomiting, unspecified: Secondary | ICD-10-CM | POA: Insufficient documentation

## 2016-01-22 DIAGNOSIS — R110 Nausea: Secondary | ICD-10-CM

## 2016-01-22 DIAGNOSIS — E118 Type 2 diabetes mellitus with unspecified complications: Secondary | ICD-10-CM | POA: Diagnosis not present

## 2016-01-22 DIAGNOSIS — J939 Pneumothorax, unspecified: Secondary | ICD-10-CM

## 2016-01-22 DIAGNOSIS — E119 Type 2 diabetes mellitus without complications: Secondary | ICD-10-CM

## 2016-01-22 DIAGNOSIS — R0600 Dyspnea, unspecified: Secondary | ICD-10-CM | POA: Diagnosis not present

## 2016-01-22 LAB — MAGNESIUM: MAGNESIUM: 1.6 mg/dL — AB (ref 1.7–2.4)

## 2016-01-22 LAB — CBC
HCT: 31.2 % — ABNORMAL LOW (ref 36.0–46.0)
Hemoglobin: 9.6 g/dL — ABNORMAL LOW (ref 12.0–15.0)
MCH: 27 pg (ref 26.0–34.0)
MCHC: 30.8 g/dL (ref 30.0–36.0)
MCV: 87.9 fL (ref 78.0–100.0)
PLATELETS: 336 10*3/uL (ref 150–400)
RBC: 3.55 MIL/uL — ABNORMAL LOW (ref 3.87–5.11)
RDW: 15.7 % — AB (ref 11.5–15.5)
WBC: 9.8 10*3/uL (ref 4.0–10.5)

## 2016-01-22 LAB — COMPREHENSIVE METABOLIC PANEL
ALBUMIN: 1.9 g/dL — AB (ref 3.5–5.0)
ALT: 11 U/L — ABNORMAL LOW (ref 14–54)
ANION GAP: 12 (ref 5–15)
AST: 19 U/L (ref 15–41)
Alkaline Phosphatase: 77 U/L (ref 38–126)
BILIRUBIN TOTAL: 0.2 mg/dL — AB (ref 0.3–1.2)
BUN: 17 mg/dL (ref 6–20)
CO2: 20 mmol/L — ABNORMAL LOW (ref 22–32)
Calcium: 7.7 mg/dL — ABNORMAL LOW (ref 8.9–10.3)
Chloride: 106 mmol/L (ref 101–111)
Creatinine, Ser: 1.14 mg/dL — ABNORMAL HIGH (ref 0.44–1.00)
GFR, EST AFRICAN AMERICAN: 52 mL/min — AB (ref >60)
GFR, EST NON AFRICAN AMERICAN: 45 mL/min — AB (ref >60)
Glucose, Bld: 181 mg/dL — ABNORMAL HIGH (ref 65–99)
POTASSIUM: 4.2 mmol/L (ref 3.5–5.1)
Sodium: 138 mmol/L (ref 135–145)
TOTAL PROTEIN: 5.6 g/dL — AB (ref 6.5–8.1)

## 2016-01-22 LAB — PROCALCITONIN: PROCALCITONIN: 0.27 ng/mL

## 2016-01-22 LAB — TROPONIN I: TROPONIN I: 0.07 ng/mL — AB (ref <0.031)

## 2016-01-22 LAB — PHOSPHORUS: Phosphorus: 2.5 mg/dL (ref 2.5–4.6)

## 2016-01-22 LAB — LACTIC ACID, PLASMA: LACTIC ACID, VENOUS: 1 mmol/L (ref 0.5–2.0)

## 2016-01-22 MED ORDER — LEVOTHYROXINE SODIUM 100 MCG IV SOLR
50.0000 ug | Freq: Every day | INTRAVENOUS | Status: DC
  Administered 2016-01-23 – 2016-01-24 (×2): 50 ug via INTRAVENOUS
  Filled 2016-01-22 (×2): qty 5

## 2016-01-22 MED ORDER — SODIUM CHLORIDE 0.9 % IV SOLN
INTRAVENOUS | Status: DC
Start: 2016-01-22 — End: 2016-01-26
  Administered 2016-01-22 – 2016-01-26 (×3): via INTRAVENOUS

## 2016-01-22 MED ORDER — WHITE PETROLATUM GEL
Status: AC
  Administered 2016-01-22: 21:00:00
  Filled 2016-01-22: qty 1

## 2016-01-22 MED ORDER — HEPARIN (PORCINE) IN NACL 100-0.45 UNIT/ML-% IJ SOLN
1450.0000 [IU]/h | INTRAMUSCULAR | Status: DC
  Administered 2016-01-22: 1250 [IU]/h via INTRAVENOUS
  Administered 2016-01-23 – 2016-01-26 (×5): 1450 [IU]/h via INTRAVENOUS
  Filled 2016-01-22 (×5): qty 250

## 2016-01-22 MED ORDER — HEPARIN BOLUS VIA INFUSION
4000.0000 [IU] | Freq: Once | INTRAVENOUS | Status: DC
  Filled 2016-01-22: qty 4000

## 2016-01-22 MED ORDER — INSULIN ASPART 100 UNIT/ML ~~LOC~~ SOLN
0.0000 [IU] | SUBCUTANEOUS | Status: DC
  Administered 2016-01-23: 3 [IU] via SUBCUTANEOUS
  Administered 2016-01-23: 5 [IU] via SUBCUTANEOUS
  Administered 2016-01-23 – 2016-01-24 (×3): 3 [IU] via SUBCUTANEOUS

## 2016-01-22 MED ORDER — PREDNISONE 10 MG PO TABS
10.0000 mg | ORAL_TABLET | Freq: Every day | ORAL | Status: DC
  Administered 2016-01-23 – 2016-02-02 (×11): 10 mg via ORAL
  Filled 2016-01-22 (×11): qty 1

## 2016-01-22 MED ORDER — FUROSEMIDE 10 MG/ML IJ SOLN
40.0000 mg | Freq: Once | INTRAMUSCULAR | Status: AC
  Administered 2016-01-22: 40 mg via INTRAVENOUS
  Filled 2016-01-22: qty 4

## 2016-01-22 MED ORDER — PIPERACILLIN-TAZOBACTAM 3.375 G IVPB
3.3750 g | Freq: Three times a day (TID) | INTRAVENOUS | Status: DC
  Administered 2016-01-23 – 2016-01-29 (×20): 3.375 g via INTRAVENOUS
  Filled 2016-01-22 (×21): qty 50

## 2016-01-22 MED ORDER — FENTANYL CITRATE (PF) 100 MCG/2ML IJ SOLN
25.0000 ug | INTRAMUSCULAR | Status: DC | PRN
  Administered 2016-01-22: 25 ug via INTRAVENOUS
  Administered 2016-01-23 (×2): 50 ug via INTRAVENOUS
  Administered 2016-01-23: 25 ug via INTRAVENOUS
  Administered 2016-01-23 – 2016-01-28 (×18): 50 ug via INTRAVENOUS
  Filled 2016-01-22 (×23): qty 2

## 2016-01-22 MED ORDER — VANCOMYCIN HCL IN DEXTROSE 1-5 GM/200ML-% IV SOLN
1000.0000 mg | Freq: Once | INTRAVENOUS | Status: AC
  Administered 2016-01-22: 1000 mg via INTRAVENOUS
  Filled 2016-01-22: qty 200

## 2016-01-22 MED ORDER — ASPIRIN 81 MG PO CHEW
81.0000 mg | CHEWABLE_TABLET | Freq: Every day | ORAL | Status: DC
  Administered 2016-01-23 – 2016-02-02 (×11): 81 mg via ORAL
  Filled 2016-01-22 (×11): qty 1

## 2016-01-22 MED ORDER — HEPARIN BOLUS VIA INFUSION
4500.0000 [IU] | Freq: Once | INTRAVENOUS | Status: DC
Start: 2016-01-22 — End: 2016-01-22
  Filled 2016-01-22: qty 4500

## 2016-01-22 MED ORDER — SODIUM CHLORIDE 0.9 % IV SOLN: 250.0000 mL | INTRAVENOUS | Status: DC | PRN

## 2016-01-22 NOTE — H&P (Signed)
Name: Kirsten Gallegos MRN: [id number] DOB: Jun 01, 1936    ADMISSION DATE:  01/22/2016 CONSULTATION DATE:  01/22/16  REFERRING MD :  [facility]  CHIEF COMPLAINT:  R chest pain   HISTORY OF PRESENT ILLNESS:  Kirsten Gallegos is a 80 y.o. female with a PMH as outlined below.  She had admission to [facility] in early March for UTI and mild DKA from which she was discharged 01/15/16.  She was apparently very debilitated but had refused home health or short term rehab. On 01/21/16, she returned to [place] with right sided chest pain as well as SOB and fever that had started 2 days after discharge with tmax 103 on day of ED presentation.  SOB had began 4 days prior but was worse on day of presentation.  She had very mild cough that was nonproductive.  Chest pain worse with deep inspiration and was significant enough that she asked son to take her to the hospital.  In ED, she had CTA which revealed a segmental RLL PE with CT suggestion of right heart strain, as well as loculated pleural effusion and multiple rib fractures in various stages of healing (pt denies any hx of trauma.  She is on chronic steroids for RA however).  She also had increasing O2 requirements and there was concern of potential to decline; therefore, transfer to [facility] was requested.  Due to no bed availability, transfer was delayed and pt did not arrive until evening of 01/22/16.    Of note, she had echo on 01/22/16 which did not reveal any right heart strain / failure.  PAST MEDICAL HISTORY :   has a past medical history of Reflux; Hypertension; Rheumatoid arthritis (HCC); and Heart attack (HCC).  has no past surgical history on file. Prior to Admission medications   Medication Sig Start Date End Date Taking? Authorizing Provider  ALPRAZolam Prudy Feeler) 0.25 MG tablet  11/02/14   Historical Provider, MD  carvedilol (COREG) 3.125 MG tablet Take 3.125 mg by mouth 2 (two) times daily with a meal.    Historical Provider, MD   cephALEXin (KEFLEX) 500 MG capsule Take 1 capsule (500 mg total) by mouth 3 (three) times daily. 01/12/15   Alvan Dame, DPM  doxycycline (VIBRA-TABS) 100 MG tablet Take 1 tablet (100 mg total) by mouth 2 (two) times daily. for 10 days 11/08/15   Asencion Islam, DPM  erythromycin ophthalmic ointment  12/05/14   Historical Provider, MD  glimepiride (AMARYL) 2 MG tablet Take 2 mg by mouth 2 (two) times daily. 12/05/14   Historical Provider, MD  HYDROcodone-acetaminophen (NORCO) 10-325 MG per tablet Take 1 tablet by mouth every 6 (six) hours as needed.    Historical Provider, MD  LANTUS SOLOSTAR 100 UNIT/ML Solostar Pen  11/23/14   Historical Provider, MD  levothyroxine (SYNTHROID, LEVOTHROID) 75 MCG tablet Take 75 mcg by mouth daily before breakfast.    Historical Provider, MD  metFORMIN (GLUCOPHAGE) 1000 MG tablet Take 1,000 mg by mouth 2 (two) times daily with a meal.    Historical Provider, MD  metoprolol (LOPRESSOR) 50 MG tablet Take 50 mg by mouth 2 (two) times daily.    Historical Provider, MD  ondansetron (ZOFRAN) 4 MG tablet  01/15/15   Historical Provider, MD  predniSONE (DELTASONE) 10 MG tablet Take 10 mg by mouth daily with breakfast.    Historical Provider, MD  silver sulfADIAZINE (SILVADENE) 1 % cream Apply 1 application topically daily. 12/09/14   Alvan Dame, DPM  simvastatin (ZOCOR) 20 MG tablet Take  20 mg by mouth daily.    Historical Provider, MD  sulfamethoxazole-trimethoprim (BACTRIM DS,SEPTRA DS) 800-160 MG per tablet  09/22/14   Historical Provider, MD  TRUETEST TEST test strip  12/04/14   Historical Provider, MD  zolpidem (AMBIEN) 10 MG tablet Take 10 mg by mouth at bedtime as needed for sleep.    Historical Provider, MD   Allergies  Allergen Reactions  . Cephalexin     Pt states since she had meningitis, she not able to take antibiotic, but did not give reactions.   . Codeine     FAMILY HISTORY:  family history includes Cancer in her father; Heart attack in her  mother. SOCIAL HISTORY:  reports that she has never smoked. She has never used smokeless tobacco. She reports that she does not drink alcohol or use illicit drugs.  REVIEW OF SYSTEMS:   All negative; except for those that are bolded, which indicate positives.  Constitutional: weight loss, weight gain, night sweats, fevers, chills, fatigue, weakness.  HEENT: headaches, sore throat, sneezing, nasal congestion, post nasal drip, difficulty swallowing, tooth/dental problems, visual complaints, visual changes, ear aches. Neuro: difficulty with speech, weakness, numbness, ataxia. CV:  Right sided chest pain, orthopnea, PND, swelling in lower extremities, dizziness, palpitations, syncope.  Resp: cough, hemoptysis, dyspnea, wheezing. GI  heartburn, indigestion, abdominal pain, nausea, vomiting, diarrhea, constipation, change in bowel habits, loss of appetite, hematemesis, melena, hematochezia.  GU: dysuria, change in color of urine, urgency or frequency, flank pain, hematuria. MSK: joint pain or swelling, decreased range of motion. Psych: change in mood or affect, depression, anxiety, suicidal ideations, homicidal ideations. Skin: rash, itching, bruising.    SUBJECTIVE:  Pain is improved compared to when she first got to [place].  Currently comfortable.  Dyspnea improved though she is currently on 40% venti mask.  VITAL SIGNS: Temp:  [98.2 F (36.8 C)] 98.2 F (36.8 C) (03/20 2016) Pulse Rate:  [71] 71 (03/20 2016) Resp:  [26] 26 (03/20 2016) BP: (152)/(88) 152/88 mmHg (03/20 2016) SpO2:  [99 %] 99 % (03/20 2016) Weight:  [77.3 kg (170 lb 6.7 oz)] 77.3 kg (170 lb 6.7 oz) (03/20 2016)  PHYSICAL EXAMINATION: General: Elderly female, resting in bed, in NAD. Neuro: A&O x 3, non-focal.  HEENT: [place]/AT. PERRL, sclerae anicteric. Cardiovascular: RRR, no M/R/G. Tenderness of right lateral chest wall. Lungs: Respirations even and unlabored.  CTA bilaterally, No W/R/R. Abdomen:  Obese. BS x 4,  soft, NT/ND.  Musculoskeletal: No gross deformities, no edema.  Skin: Intact, warm, no rashes.    No results for input(s): NA, K, CL, CO2, BUN, CREATININE, GLUCOSE in the last 168 hours. No results for input(s): HGB, HCT, WBC, PLT in the last 168 hours. No results found.  STUDIES:  CTA 03/19 > segmental PE in RLL, RV/ LV 0.93.  Moderate right pleural effusion with some loculation along the fissures. Multiple rib fx's in various stages of healing. Echo 03/20 (suboptimal views) > EF 50 - 55%, normal RV.  Mild LA dilation.  Small pericardial effusion. LE Duplex 03/21 >  SIGNIFICANT EVENTS  03/19 > presented to [facility] with chest pain > found to have PE, loculated effusion, and multiple rib fx's. 03/20 > transferred to [facility].  ASSESSMENT / PLAN:  Pulmonary Embolism - suspect due to recent debilitation and decreased activity level.  Echo performed 03/20 revealed EF 50 - 55% with normal RV function. Plan: Continue heparin gtt. Would likely be bette suited to transition to warfarin over other NOAC. Assess troponin, LE duplex.  Acute hypoxic respiratory failure - due to above + pleural effusion + splinting due to pain from rib fx's. Atelectasis - due to splinting + effusion. Plan: Continue supplemental O2 as needed to maintain SpO2 > 92%. Incentive spirometry.  Pleural effusion - loculated per notes from [place].  No images in our system to review. Possible HCAP. Plan: Continue empiric abx. PCT algorithm to limit abx exposure - if low, consider d/c abx given no fever/leukocytosis. Assess sputum culture. Diuresis as able. Engineer, materials from [place]. May need IR vs CVTS consult given loculations.  Rib fractures - pt denies any trauma, abuse, falls.  Unclear etiology at this point but note that pt is on chronic steroid therapy for her RA; therefore, could be secondary to osteoporosis. Pain - due to above. Plan: Fentanyl PRN.  Pericardial effusion - as noted on echo from  [place]. Plan:  Diuresis as able. Consider cardiology consult.  DM. Plan: SSI (keeping NPO for now in case any procedures required). Transition back to outpatient regimen once taking PO.  Hypothyroidism. Plan: Continue outpatient synthroid, change to IV formulation.  Rheumatoid Arthritis. Plan: Continue outpatient prednisone.  CC time:  30 minutes.   Pt stable at this time, no further interventions from PCCM standpoint.  Order placed to attempt to obtain PACS images from [place] for further evaluation of effusion.  Called and discussed case with [employer] account planner.  TRH to assume care starting in AM 01/23/16 and PCCM will sign off.  Please call back if we can be of further assistance.   Rutherford Guys, [place] - [facility] Pager: [phone]  or [phone] 01/22/2016, 9:31 PM

## 2016-01-22 NOTE — Progress Notes (Signed)
Pharmacy Antibiotic Note  Kirsten Gallegos is a 80 y.o. female admitted on 01/22/2016 with Loculated pleural effusion.  Pharmacy has been consulted for vancomycin/zosyn dosing. Pt is a transfer from [place] and was started on vancomycin and Zosyn there. Previous doses documented at [facility]: - 3/20 @ 0202 vancomycin 1 gram dose - 3/20 @ 1806 Zosyn 3.375 gram dose  Plan: Give vancomycin 1 gram x 1 and assess for scheduled dosing once labs come back. Continue Zosyn 3.375g IV q8h (4 hour infusion).   Height: 5\' 6"  (167.6 cm) Weight: 170 lb 6.7 oz (77.3 kg) IBW/kg (Calculated) : 59.3  Temp (24hrs), Avg:98.2 F (36.8 C), Min:98.2 F (36.8 C), Max:98.2 F (36.8 C)   Recent Labs Lab 01/22/16 2203  WBC 9.8    CrCl cannot be calculated (Patient has no serum creatinine result on file.).    Allergies  Allergen Reactions  . Cephalexin     Pt states since she had meningitis, she not able to take antibiotic, but did not give reactions.   . Codeine     Antimicrobials this admission: 3/19 Zosyn >>  3/20 Vancomycin >>   Dose adjustments this admission: none   Thank you for allowing 4/20 to participate in this patients care. [place], PharmD Pager: [phone] 01/22/2016 10:37 PM

## 2016-01-22 NOTE — Progress Notes (Addendum)
ANTICOAGULATION CONSULT NOTE - Initial Consult  Pharmacy Consult for heparin Indication: pulmonary embolus  Allergies  Allergen Reactions  . Cephalexin     Pt states since she had meningitis, she not able to take antibiotic, but did not give reactions.   . Codeine     Patient Measurements: Height: 5\' 6"  (167.6 cm) Weight: 170 lb 6.7 oz (77.3 kg) IBW/kg (Calculated) : 59.3 Heparin Dosing Weight: 75.1 kg  Vital Signs: Temp: 98.2 F (36.8 C) (03/20 2016) Temp Source: Oral (03/20 2016) BP: 152/88 mmHg (03/20 2016) Pulse Rate: 71 (03/20 2016)  Labs: No results for input(s): HGB, HCT, PLT, APTT, LABPROT, INR, HEPARINUNFRC, HEPRLOWMOCWT, CREATININE, CKTOTAL, CKMB, TROPONINI in the last 72 hours.  CrCl cannot be calculated (Patient has no serum creatinine result on file.).   Medical History: Past Medical History  Diagnosis Date  . Reflux   . Hypertension   . Rheumatoid arthritis (HCC)   . Heart attack (HCC)     Medications:  Facility-administered medications prior to admission  Medication Dose Route Frequency Provider Last Rate Last Dose  . triamcinolone acetonide (KENALOG) 10 MG/ML injection 10 mg  10 mg Other Once 12-14-1970, DPM      . triamcinolone acetonide (KENALOG) 10 MG/ML injection 10 mg  10 mg Other Once Alvan Dame, DPM       Prescriptions prior to admission  Medication Sig Dispense Refill Last Dose  . ALPRAZolam (XANAX) 0.25 MG tablet   0 Taking  . carvedilol (COREG) 3.125 MG tablet Take 3.125 mg by mouth 2 (two) times daily with a meal.   Taking  . cephALEXin (KEFLEX) 500 MG capsule Take 1 capsule (500 mg total) by mouth 3 (three) times daily. 30 capsule 0 Taking  . doxycycline (VIBRA-TABS) 100 MG tablet Take 1 tablet (100 mg total) by mouth 2 (two) times daily. for 10 days 20 tablet 0   . erythromycin ophthalmic ointment   0 Taking  . glimepiride (AMARYL) 2 MG tablet Take 2 mg by mouth 2 (two) times daily.  0 Taking  . HYDROcodone-acetaminophen  (NORCO) 10-325 MG per tablet Take 1 tablet by mouth every 6 (six) hours as needed.   Taking  . LANTUS SOLOSTAR 100 UNIT/ML Solostar Pen   0 Taking  . levothyroxine (SYNTHROID, LEVOTHROID) 75 MCG tablet Take 75 mcg by mouth daily before breakfast.   Taking  . metFORMIN (GLUCOPHAGE) 1000 MG tablet Take 1,000 mg by mouth 2 (two) times daily with a meal.   Taking  . metoprolol (LOPRESSOR) 50 MG tablet Take 50 mg by mouth 2 (two) times daily.   Taking  . ondansetron (ZOFRAN) 4 MG tablet   0 Taking  . predniSONE (DELTASONE) 10 MG tablet Take 10 mg by mouth daily with breakfast.   Taking  . silver sulfADIAZINE (SILVADENE) 1 % cream Apply 1 application topically daily. 50 g 2 Taking  . simvastatin (ZOCOR) 20 MG tablet Take 20 mg by mouth daily.   Taking  . sulfamethoxazole-trimethoprim (BACTRIM DS,SEPTRA DS) 800-160 MG per tablet   0 Taking  . TRUETEST TEST test strip   0 Taking  . zolpidem (AMBIEN) 10 MG tablet Take 10 mg by mouth at bedtime as needed for sleep.   Taking   Scheduled:  . [START ON 01/23/2016] aspirin  81 mg Oral Daily  . heparin  4,000 Units Intravenous Once  . insulin aspart  0-15 Units Subcutaneous 6 times per day  . levothyroxine  50 mcg Intravenous Daily  . [START ON  01/23/2016] predniSONE  10 mg Oral Q breakfast  . white petrolatum        Assessment: 80 yoF with pulmonary embolism, pharmacy consulted to dose heparin.  Recent discharge (3/13) from [facility] for UTI and mild DKA. She was apparently very debilitated but had refused home health or short term rehab. On 01/21/16, she returned to [place] with right sided chest pain as well as SOB and fever that had started 2 days after discharge with tmax 103 on day of ED presentation.In ED, CTA revealed a segmental RLL PE.Of note, she had echo on 01/22/16 which did not reveal any right heart strain / failure.  Pt on heparin gtt prior to arrival running at 1250 un/hr. Will continue at this rate and check a level in the  morning to assess rate.  Goal of Therapy:  Heparin level 0.3-0.7 units/ml Monitor platelets by anticoagulation protocol: Yes   Plan:  Continue heparin infusion at 1250 units/hr Check anti-Xa level in 8 hours and daily while on heparin Continue to monitor H&H and platelets   Thank you for allowing [place] to participate in this patients care. Signe Colt, PharmD Pager: [phone] 01/22/2016,9:43 PM

## 2016-01-23 ENCOUNTER — Inpatient Hospital Stay (HOSPITAL_COMMUNITY): Payer: Medicare Other

## 2016-01-23 DIAGNOSIS — R0902 Hypoxemia: Secondary | ICD-10-CM

## 2016-01-23 DIAGNOSIS — I2609 Other pulmonary embolism with acute cor pulmonale: Secondary | ICD-10-CM | POA: Insufficient documentation

## 2016-01-23 DIAGNOSIS — I2699 Other pulmonary embolism without acute cor pulmonale: Secondary | ICD-10-CM

## 2016-01-23 DIAGNOSIS — J90 Pleural effusion, not elsewhere classified: Secondary | ICD-10-CM | POA: Insufficient documentation

## 2016-01-23 DIAGNOSIS — R0602 Shortness of breath: Secondary | ICD-10-CM | POA: Insufficient documentation

## 2016-01-23 DIAGNOSIS — J948 Other specified pleural conditions: Secondary | ICD-10-CM

## 2016-01-23 DIAGNOSIS — L8990 Pressure ulcer of unspecified site, unspecified stage: Secondary | ICD-10-CM | POA: Insufficient documentation

## 2016-01-23 LAB — BASIC METABOLIC PANEL
ANION GAP: 10 (ref 5–15)
BUN: 15 mg/dL (ref 6–20)
CO2: 23 mmol/L (ref 22–32)
Calcium: 7.8 mg/dL — ABNORMAL LOW (ref 8.9–10.3)
Chloride: 105 mmol/L (ref 101–111)
Creatinine, Ser: 1.17 mg/dL — ABNORMAL HIGH (ref 0.44–1.00)
GFR, EST AFRICAN AMERICAN: 51 mL/min — AB (ref >60)
GFR, EST NON AFRICAN AMERICAN: 44 mL/min — AB (ref >60)
GLUCOSE: 96 mg/dL (ref 65–99)
POTASSIUM: 3.4 mmol/L — AB (ref 3.5–5.1)
Sodium: 138 mmol/L (ref 135–145)

## 2016-01-23 LAB — CBC
HEMATOCRIT: 30.6 % — AB (ref 36.0–46.0)
Hemoglobin: 9.4 g/dL — ABNORMAL LOW (ref 12.0–15.0)
MCH: 26.6 pg (ref 26.0–34.0)
MCHC: 30.7 g/dL (ref 30.0–36.0)
MCV: 86.7 fL (ref 78.0–100.0)
PLATELETS: 354 10*3/uL (ref 150–400)
RBC: 3.53 MIL/uL — AB (ref 3.87–5.11)
RDW: 15.8 % — ABNORMAL HIGH (ref 11.5–15.5)
WBC: 9.1 10*3/uL (ref 4.0–10.5)

## 2016-01-23 LAB — LACTATE DEHYDROGENASE, PLEURAL OR PERITONEAL FLUID: LD FL: 546 U/L — AB (ref 3–23)

## 2016-01-23 LAB — APTT: APTT: 74 s — AB (ref 24–37)

## 2016-01-23 LAB — PROTIME-INR
INR: 1.41 (ref 0.00–1.49)
PROTHROMBIN TIME: 17.3 s — AB (ref 11.6–15.2)

## 2016-01-23 LAB — GRAM STAIN

## 2016-01-23 LAB — BODY FLUID CELL COUNT WITH DIFFERENTIAL
EOS FL: 0 %
Lymphs, Fluid: 5 %
MONOCYTE-MACROPHAGE-SEROUS FLUID: 24 % — AB (ref 50–90)
Neutrophil Count, Fluid: 71 % — ABNORMAL HIGH (ref 0–25)
Total Nucleated Cell Count, Fluid: 1509 cu mm — ABNORMAL HIGH (ref 0–1000)

## 2016-01-23 LAB — GLUCOSE, CAPILLARY
GLUCOSE-CAPILLARY: 157 mg/dL — AB (ref 65–99)
GLUCOSE-CAPILLARY: 174 mg/dL — AB (ref 65–99)
GLUCOSE-CAPILLARY: 206 mg/dL — AB (ref 65–99)
Glucose-Capillary: 88 mg/dL (ref 65–99)
Glucose-Capillary: 97 mg/dL (ref 65–99)
Glucose-Capillary: 122 mg/dL — ABNORMAL HIGH (ref 65–99)

## 2016-01-23 LAB — GLUCOSE, SEROUS FLUID: GLUCOSE FL: 51 mg/dL

## 2016-01-23 LAB — TSH: TSH: 0.523 u[IU]/mL (ref 0.350–4.500)

## 2016-01-23 LAB — MRSA PCR SCREENING: MRSA by PCR: NEGATIVE

## 2016-01-23 LAB — PHOSPHORUS: PHOSPHORUS: 1.9 mg/dL — AB (ref 2.5–4.6)

## 2016-01-23 LAB — HEPARIN LEVEL (UNFRACTIONATED)
HEPARIN UNFRACTIONATED: 0.26 [IU]/mL — AB (ref 0.30–0.70)
HEPARIN UNFRACTIONATED: 0.42 [IU]/mL (ref 0.30–0.70)

## 2016-01-23 LAB — PROTEIN, BODY FLUID: TOTAL PROTEIN, FLUID: 3.4 g/dL

## 2016-01-23 LAB — MAGNESIUM: Magnesium: 1.6 mg/dL — ABNORMAL LOW (ref 1.7–2.4)

## 2016-01-23 MED ORDER — SODIUM CHLORIDE 0.9% FLUSH: 10.0000 mL | INTRAVENOUS | Status: DC | PRN

## 2016-01-23 MED ORDER — POTASSIUM CHLORIDE CRYS ER 20 MEQ PO TBCR
40.0000 meq | EXTENDED_RELEASE_TABLET | Freq: Once | ORAL | Status: AC
  Administered 2016-01-23: 40 meq via ORAL
  Filled 2016-01-23: qty 2

## 2016-01-23 MED ORDER — HEPARIN BOLUS VIA INFUSION
2000.0000 [IU] | Freq: Once | INTRAVENOUS | Status: AC
  Administered 2016-01-23: 2000 [IU] via INTRAVENOUS
  Filled 2016-01-23: qty 2000

## 2016-01-23 MED ORDER — ZOLPIDEM TARTRATE 5 MG PO TABS
5.0000 mg | ORAL_TABLET | Freq: Once | ORAL | Status: AC
  Administered 2016-01-23: 5 mg via ORAL
  Filled 2016-01-23: qty 1

## 2016-01-23 MED ORDER — LIDOCAINE HCL (PF) 1 % IJ SOLN
INTRAMUSCULAR | Status: AC
  Administered 2016-01-23: 13:00:00
  Filled 2016-01-23: qty 10

## 2016-01-23 MED ORDER — VANCOMYCIN HCL IN DEXTROSE 750-5 MG/150ML-% IV SOLN
750.0000 mg | INTRAVENOUS | Status: DC
  Administered 2016-01-23 – 2016-01-25 (×3): 750 mg via INTRAVENOUS
  Filled 2016-01-23 (×3): qty 150

## 2016-01-23 MED ORDER — SODIUM CHLORIDE 0.9% FLUSH
10.0000 mL | Freq: Two times a day (BID) | INTRAVENOUS | Status: DC
  Administered 2016-01-23 – 2016-01-28 (×10): 10 mL
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
## 2016-01-23 NOTE — Consult Note (Signed)
WOC wound consult note Reason for Consult: sacrum, heel and foot Patient has been followed by podiatry per family. Has recently been bedbound from foot issues.  Had callous trimmed on the right foot plantar surface  Wound type: Deep Tissue Pressure Injury; left buttock Unstageable Pressure INjury: right lateral foot at 5th metatarsal Stage 2 Pressure Injury; right heel Pressure Ulcer POA: No Measurement: Right lateral foot: 0.1cm x 0.1cm x 0; 100% black tiny eschar Right heel: 1.0cm x 1.0cm x 0.1cm: partial thickness skin loss, pink, dry Left buttock: 0.5cm x 0.5cm x 0cm and 0.1cm x 0.1cm x 0: dark maroon tissue, non blanchable Wound bed: see above Drainage (amount, consistency, odor)none  Periwound: intact Dressing procedure/placement/frequency: Foam dressings to protect current sites, change every 3 days Air mattress replacement due to current debility and high risk for skin breakdown.  Discussed POC with patient and bedside nurse.  Re consult if needed, will not follow at this time. Thanks  Junnie Loschiavo [employer], CWOCN [phone])
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
## 2016-01-23 NOTE — Progress Notes (Signed)
*  PRELIMINARY RESULTS* Vascular Ultrasound Lower extremity venous duplex has been completed.  Preliminary findings: Not all segments well visualized due to patient intolerance of venous compression maneuvers. No obvious DVT is noted in BLE.    Farrel Demark, RDMS, RVT  01/23/2016, 3:11 PM
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
## 2016-01-23 NOTE — Progress Notes (Signed)
Densely/severely loculated right pleural effusion Successful US guided right thoracentesis. Yielded 73mL of hazy, blood tinged fluid. Pt tolerated procedure well. No immediate complications.  Specimen was sent for labs. CXR ordered.  Brayton El PA-C Interventional Radiology 01/23/2016 1:43 PM
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
## 2016-01-23 NOTE — Procedures (Deleted)
Densely/severely loculated right pleural effusion Successful US guided right thoracentesis. Yielded 46mL of hazy, blood tinged fluid. Pt tolerated procedure well. No immediate complications.  Specimen was sent for labs. CXR ordered.  Brayton El PA-C 01/23/2016 1:06 PM

## 2016-01-24 ENCOUNTER — Inpatient Hospital Stay (HOSPITAL_COMMUNITY): Payer: Medicare Other

## 2016-01-24 ENCOUNTER — Encounter (HOSPITAL_COMMUNITY): Payer: Self-pay | Admitting: *Deleted

## 2016-01-24 DIAGNOSIS — J90 Pleural effusion, not elsewhere classified: Secondary | ICD-10-CM

## 2016-01-24 LAB — PROCALCITONIN: PROCALCITONIN: 0.13 ng/mL

## 2016-01-24 LAB — PH, BODY FLUID: pH, Body Fluid: 7.7

## 2016-01-24 LAB — CBC
HCT: 32.3 % — ABNORMAL LOW (ref 36.0–46.0)
Hemoglobin: 10.3 g/dL — ABNORMAL LOW (ref 12.0–15.0)
MCH: 27.5 pg (ref 26.0–34.0)
MCHC: 31.9 g/dL (ref 30.0–36.0)
MCV: 86.1 fL (ref 78.0–100.0)
PLATELETS: 379 10*3/uL (ref 150–400)
RBC: 3.75 MIL/uL — ABNORMAL LOW (ref 3.87–5.11)
RDW: 15.3 % (ref 11.5–15.5)
WBC: 7.8 10*3/uL (ref 4.0–10.5)

## 2016-01-24 LAB — COMPREHENSIVE METABOLIC PANEL
ALBUMIN: 1.8 g/dL — AB (ref 3.5–5.0)
ALT: 10 U/L — ABNORMAL LOW (ref 14–54)
AST: 16 U/L (ref 15–41)
Alkaline Phosphatase: 76 U/L (ref 38–126)
Anion gap: 13 (ref 5–15)
BUN: 13 mg/dL (ref 6–20)
CHLORIDE: 103 mmol/L (ref 101–111)
CO2: 23 mmol/L (ref 22–32)
CREATININE: 0.99 mg/dL (ref 0.44–1.00)
Calcium: 8.3 mg/dL — ABNORMAL LOW (ref 8.9–10.3)
GFR calc Af Amer: >60 mL/min (ref >60)
GFR, EST NON AFRICAN AMERICAN: 54 mL/min — AB (ref >60)
GLUCOSE: 89 mg/dL (ref 65–99)
POTASSIUM: 3.5 mmol/L (ref 3.5–5.1)
SODIUM: 139 mmol/L (ref 135–145)
Total Bilirubin: 0.5 mg/dL (ref 0.3–1.2)
Total Protein: 5.8 g/dL — ABNORMAL LOW (ref 6.5–8.1)

## 2016-01-24 LAB — GLUCOSE, CAPILLARY
GLUCOSE-CAPILLARY: 85 mg/dL (ref 65–99)
GLUCOSE-CAPILLARY: 89 mg/dL (ref 65–99)
GLUCOSE-CAPILLARY: 92 mg/dL (ref 65–99)
Glucose-Capillary: 74 mg/dL (ref 65–99)
Glucose-Capillary: 156 mg/dL — ABNORMAL HIGH (ref 65–99)
Glucose-Capillary: 167 mg/dL — ABNORMAL HIGH (ref 65–99)
Glucose-Capillary: 157 mg/dL — ABNORMAL HIGH (ref 65–99)

## 2016-01-24 LAB — ABO/RH: ABO/RH(D): O POS

## 2016-01-24 LAB — HEPARIN LEVEL (UNFRACTIONATED): HEPARIN UNFRACTIONATED: 0.42 [IU]/mL (ref 0.30–0.70)

## 2016-01-24 LAB — HEMOGLOBIN A1C
HEMOGLOBIN A1C: 11.7 % — AB (ref 4.8–5.6)
Mean Plasma Glucose: 289 mg/dL

## 2016-01-24 MED ORDER — LEVOTHYROXINE SODIUM 100 MCG PO TABS
100.0000 ug | ORAL_TABLET | Freq: Every day | ORAL | Status: DC
  Administered 2016-01-25 – 2016-02-02 (×9): 100 ug via ORAL
  Filled 2016-01-24 (×9): qty 1

## 2016-01-24 MED ORDER — ZOLPIDEM TARTRATE 5 MG PO TABS
5.0000 mg | ORAL_TABLET | Freq: Every evening | ORAL | Status: DC | PRN
  Administered 2016-01-24 – 2016-01-26 (×2): 5 mg via ORAL
  Filled 2016-01-24 (×2): qty 1

## 2016-01-24 MED ORDER — HYDROCODONE-ACETAMINOPHEN 5-325 MG PO TABS
1.0000 | ORAL_TABLET | ORAL | Status: DC | PRN
  Administered 2016-01-24 – 2016-01-26 (×5): 1 via ORAL
  Filled 2016-01-24 (×5): qty 1

## 2016-01-24 MED ORDER — IOHEXOL 350 MG/ML SOLN
80.0000 mL | Freq: Once | INTRAVENOUS | Status: AC | PRN
  Administered 2016-01-24: 80 mL via INTRAVENOUS

## 2016-01-24 MED ORDER — INSULIN ASPART 100 UNIT/ML ~~LOC~~ SOLN: 0.0000 [IU] | Freq: Three times a day (TID) | SUBCUTANEOUS | Status: DC

## 2016-01-24 NOTE — Consult Note (Signed)
CARDIOLOGY CONSULT NOTE   Patient ID: Kirsten Gallegos MRN: [id number] DOB/AGE: 1935/12/05 80 y.o.  Admit date: 01/22/2016  Primary Physician   Quentin Mulling, MD Primary Cardiologist   New? Reason for Consultation   Pericardial effusion Requesting Physician  Dr. Susie Cassette  HPI: Angeliah Gallegos is a 80 y.o. female with a history of CAD, RA, HTN,  DM, HL and hypothyroidism who transferred from [facility] hospital 01/22/16 with PE.   The patient denies any prior hx of CAD. However has hx of "Broken Heart Syndrome" about 5 years ago and evaluated at [facility]. She is currently drowsy and lethargic. Answers short question and goes back to sleep. Hx of obtained from records. Records from [facility] indicates hx of CAD, MI, cardiac catheterization and CHF. Echo 2011 --> EF of 55-60%, diastolic dysfunction and chronic pericardial thickening).    the patient was recently discharged from [facility] 01/15/16 after treatment for UTI and mild DKA. At that time, she refused short term rehabilitation and home health. He was returned to [facility] 01/21/16 with right-sided chest pain, shortness of breath and fever. She also had a mild productive cough and chest pain was worsened with deep breath and described as a sharp. Workup in ED at [facility] shows right lower lobe PE with ? RV strain and multiple ribs fracture on CT of chest. Follow-up echocardiogram did not show RV strain however did shows showed small pericardial effusion.  Denies recent trauma or fall. Transfers to [facility] for further evaluation due need of supplemental oxygen. No hypotension. The patient was treated with IV heparin.   Had a thoracocentesis done at [facility] for pleural effusion with removal of  50 mL of very thick bloody effusion-->  recommended a repeat CT scan for comparison by Dr. Laneta Simmers. No EKG done during admission. EKG at the ED of [facility] ED shows normal sinus  rhythm with nonspecific T-wave abnormality. A1c of 11.7.   Denies any history of tobacco abuse or illicit drug use. Father has MI and mother has CHF per patient. Denies any history of palpitation, syncope, lower extremity edema, orthopnea, PND or blood in her stool or urine.  Past Medical History  Diagnosis Date  . Reflux   . Hypertension   . Rheumatoid arthritis (HCC)   . Heart attack [facility])      History reviewed. No pertinent past surgical history.  Allergies  Allergen Reactions  . Cephalexin Other (See Comments)    Pt states since she had meningitis, she not able to take antibiotic, but did not give reactions.   . Codeine Nausea And Vomiting    I have reviewed the patient's current medications . aspirin  81 mg Oral Daily  . insulin aspart  0-15 Units Subcutaneous 6 times per day  . [START ON 01/25/2016] levothyroxine  100 mcg Oral QAC breakfast  . piperacillin-tazobactam (ZOSYN)  IV  3.375 g Intravenous Q8H  . predniSONE  10 mg Oral Q breakfast  . sodium chloride flush  10-40 mL Intracatheter Q12H  . vancomycin  750 mg Intravenous Q24H   . sodium chloride 75 mL/hr at 01/24/16 4401  . heparin 1,450 Units/hr (01/24/16 0700)   sodium chloride, fentaNYL (SUBLIMAZE) injection, sodium chloride flush  Prior to Admission medications   Medication Sig Start Date End Date Taking? Authorizing Provider  ALPRAZolam (XANAX) 0.25 MG tablet Take 0.25 mg by mouth 2 (two) times daily as needed for anxiety.  11/02/14  Yes Historical Provider, MD  aspirin EC 81 MG tablet Take 81 mg by mouth daily.   Yes Historical Provider, MD  furosemide (LASIX) 20 MG tablet Take 10 mg by mouth daily. 11/30/15  Yes Historical Provider, MD  glimepiride (AMARYL) 1 MG tablet Take 1 mg by mouth 2 (two) times daily. 01/15/16  Yes Historical Provider, MD  HYDROcodone-acetaminophen (NORCO) 10-325 MG per tablet Take 1 tablet by mouth every 8 (eight) hours as needed (pain).    Yes Historical Provider, MD  Insulin  Glargine (LANTUS SOLOSTAR) 100 UNIT/ML Solostar Pen Inject 12 Units into the skin at bedtime.   Yes Historical Provider, MD  levothyroxine (SYNTHROID, LEVOTHROID) 100 MCG tablet Take 100 mcg by mouth daily. 12/24/15  Yes Historical Provider, MD  Liniments (BLUE-EMU SUPER STRENGTH EX) Apply 1 application topically daily as needed (neck pain).   Yes Historical Provider, MD  metoprolol (LOPRESSOR) 50 MG tablet Take 50 mg by mouth 3 (three) times daily.    Yes Historical Provider, MD  neomycin-bacitracin-polymyxin (NEOSPORIN) OINT Apply 1 application topically daily as needed for wound care (foot ulcers).   Yes Historical Provider, MD  simvastatin (ZOCOR) 20 MG tablet Take 20 mg by mouth daily.   Yes Historical Provider, MD  silver sulfADIAZINE (SILVADENE) 1 % cream Apply 1 application topically daily. Patient not taking: Reported on 01/23/2016 12/09/14   Alvan Dame, DPM     Social History   Social History  . Marital Status: Married    Spouse Name: N/A  . Number of Children: N/A  . Years of Education: N/A   Occupational History  . Not on file.   Social History Main Topics  . Smoking status: Never Smoker   . Smokeless tobacco: Never Used  . Alcohol Use: No  . Drug Use: No  . Sexual Activity: Not on file   Other Topics Concern  . Not on file   Social History Narrative    Family Status  Relation Status Death Age  . Mother Deceased   . Father Deceased    Family History  Problem Relation Age of Onset  . Heart attack Mother   . Cancer Father       ROS:  Full 14 point review of systems complete and found to be negative unless listed above.  Physical Exam: Blood pressure 159/87, pulse 86, temperature 97.9 F (36.6 C), temperature source Oral, resp. rate 18, height 5\' 6"  (1.676 m), weight 169 lb 12.1 oz (77 kg), SpO2 91 %.  General: Well developed, well nourished, female in no acute distress however somewhat drowsy and lethargic. Head: Eyes PERRLA, No xanthomas. Normocephalic  and atraumatic, oropharynx without edema or exudate.  Lungs: Resp regular and unlabored, CTA. Heart: RRR no s3, s4, or murmurs..   Neck: No carotid bruits. No lymphadenopathy.  No JVD. Abdomen: Bowel sounds present, abdomen soft and non-tender without masses or hernias noted. Msk:  No spine or cva tenderness. No weakness, no joint deformities or effusions. Extremities: No clubbing, cyanosis or edema. DP/PT/Radials 2+ and equal bilaterally. Neuro: Alert and oriented X 3. No focal deficits noted. answers questions and goes back to sleep Psych:  Good affect, responds appropriately however lethargy  Skin: No rashes or lesions noted.  Labs:   Lab Results  Component Value Date   WBC 7.8 01/24/2016   HGB 10.3* 01/24/2016   HCT 32.3* 01/24/2016   MCV 86.1 01/24/2016   PLT 379 01/24/2016    Recent Labs  01/23/16 0126  INR 1.41     Recent Labs Lab  01/24/16 0831  NA 139  K 3.5  CL 103  CO2 23  BUN 13  CREATININE 0.99  CALCIUM 8.3*  PROT 5.8*  BILITOT 0.5  ALKPHOS 76  ALT 10*  AST 16  GLUCOSE 89  ALBUMIN 1.8*   MAGNESIUM  Date Value Ref Range Status  01/23/2016 1.6* 1.7 - 2.4 mg/dL Final    Recent Labs  00/45/99 2203  TROPONINI 0.07*   No results for input(s): TROPIPOC in the last 72 hours. No results found for: PROBNP No results found for: CHOL, HDL, LDLCALC, TRIG No results found for: DDIMER No results found for: LIPASE, AMYLASE TSH  Date/Time Value Ref Range Status  01/22/2016 10:47 PM 0.523 0.350 - 4.500 uIU/mL Final   No results found for: VITAMINB12, FOLATE, FERRITIN, TIBC, IRON, RETICCTPCT   Echo 2011 --> EF of 55-60%, diastolic dysfunction and chronic pericardial thickening).   Echo 01/22/16 at [facility]: Technically difficult study with left ventricular function of 50-55%, mild aortic regurgitation, small pericardial effusion.    EKG at the ED of [facility] ED shows normal sinus rhythm with nonspecific T-wave  abnormality.   Radiology:  Dg Chest 1 View  01/23/2016  CLINICAL DATA:  Post right thoracentesis EXAM: CHEST 1 VIEW COMPARISON:  3/20/ 17 FINDINGS: Cardiomediastinal silhouette is stable. There is small residual partial loculated right pleural effusion. No pneumothorax. Persistent bilateral basilar atelectasis. IMPRESSION: Small residual partial loculated right pleural effusion. No pneumothorax. Bilateral basilar atelectasis. No pulmonary edema. Electronically Signed   By: Natasha Mead M.D.   On: 01/23/2016 13:49   Dg Chest Port 1 View  01/22/2016  CLINICAL DATA:  Respiratory failure and chest pain EXAM: PORTABLE CHEST 1 VIEW COMPARISON:  None. FINDINGS: Cardiac shadow is not enlarged. Right-sided pleural effusion is noted. There is likely underlying right basilar infiltrate. Some minimal atelectasis is noted in the left base. No acute bony abnormality is seen. IMPRESSION: Right pleural effusion and likely underlying infiltrate. Left basilar atelectasis is noted. Electronically Signed   By: Alcide Clever M.D.   On: 01/22/2016 21:53    ASSESSMENT AND PLAN:      Shain Papst is a 80 y.o. female with a history of CAD, RA, HTN,  DM, HL and hypothyroidism who transferred from [facility] 01/22/16 with PE.   1. Pericardial effusion - Noted small pericardial effusion on echocardiogram at [place]. Pending repeat echocardiogram. Pt's HR normal, BP 150s/  Does not appear to be hemodynamically unstable  Repeat echo pending    2. PE -  currently on IV heparin for anticoagulation. No hypotension or RV stain on echocardiogram.   3. Pleural effusion -s/p  thoracocentesis with removal of  50 mL of very thick bloody effusion. He commended repeat CT scan by Dr. Lavinia Sharps.  4. CAD ? - Echo 2011 --> EF of 55-60%, diastolic dysfunction and chronic pericardial thickening.  -  Hx of "Broken Heart Syndrome" about 5 years ago and evaluated at [facility] per patient. --> will request  records.  - Records from [facility] indicates hx of CAD, MI, cardiac catheterization and CHF.  SignedManson Passey, PA 01/24/2016, 12:25 PM Pager [phone]  Co-Sign MD  Pt seen and examined  I agree with findingas of B Bhagat   80 yo with PE and pericardial effusion on echo at [facility] hsop  Currently complaining of R sided CP ON exam Lungs rel clear  Cardiac exam RRR  No S3  Ext no edema  Plan for echo to eval  Continue to follow BP  WIll follow   [name]

## 2016-01-24 NOTE — Clinical Documentation Improvement (Signed)
Internal Medicine  Can the diagnosis of pressure ulcer be further specified? Please document site and stage if known in next progress note. Thank you!   Document if pressure ulcer with stage is Present on Admission   Document Site with laterality - Elbow, Back (upper/lower), Sacral, Hip, Buttock, Ankle, Heel, Head, Other (Specify)  Pressure Ulcer Stage - Stage1, Stage 2, Stage 3, Stage 4, Unstageable, Unspecified, Unable to Clinically Determine  Other  Clinically Undetermined  Supporting Information:  WOC Consult notes: Stage 2 Pressure Injury to right heel and Unstageable Pressure Injury to right lateral foot at 5th metatarsal; has recently been bedbound.  Please exercise your independent, professional judgment when responding. A specific answer is not anticipated or expected.  Thank You,  Shellee Milo RN, BSN, CCDS Health Information Management [place] [phone]

## 2016-01-24 NOTE — Progress Notes (Addendum)
[facility] PROGRESS NOTE  Kirsten Gallegos WCH:[id number] DOB: 08/14/1936 DOA: 01/22/2016 PCP: Quentin Mulling, MD  Length of stay: 2   Assessment/Plan: Active Problems:   Pulmonary embolism (HCC)   Pleural effusion   Pericardial effusion   Rheumatoid arthritis with positive rheumatoid factor (HCC)   Pressure ulcer   Pulmonary embolism with acute cor pulmonale (HCC)   Pleural effusion on right   Shortness of breath   Hypoxemia    Brief summary  80 year-old female who presented to an outside hospital with shortness of breath or fever. She had a nonproductive cough at that time. She is noted to have some chest pain on the right. Her son brought her to the emergency department. In the emergency room she had a right lower lobe pulmonary embolism noted on CT chest question of RV strain. Follow-up echocardiogram showed no evidence of RV strain. She was transferred to [facility] for further evaluation, unclear why.  She was also noted to have multiple rib fractures of uncertain etiology. She denies any recent fall or trauma or abuse   Assessment and plan Acute pulmonary embolism Patient would need 3-6 months of anticoagulation therapy. There is no hypotension or RV strain on echocardiogram so there is no need to consider any higher level of intervention at this time. Would continue heparin pending further evaluation of a purulent effusion Echo 03/20 (suboptimal views) > EF 50 - 55%, normal RV. Mild LA dilation. Small pericardial effusion Venous Doppler Negative  Pericardial effusion- Repeat 2-D echo to further evaluate for pericardial effusion, cardiology consultation requested  Loculated Pleural effusion Parapneumonic vs secondary to rib fractures Currently on empiric antibiotics Pro calcitonin pending Thoracentesis yielded 50 mL of very thick bloody effusion, consulted Dr. Laneta Simmers, he recommends repeat CT scan, For comparison  Rib fractures-trauma vs abuse vs fall,  PT OT consultation  Diabetes mellitus-continue sliding scale insulin  Hypothyroidism continue Synthroid  Rheumatoid arthritis chronically on prednisone  Hypertension on metoprolol Coreg at home  DVT prophylaxsis heparin  Code Status:      Code Status Orders        Start     Ordered   01/22/16 2129  Full code   Continuous     01/22/16 2129    Family Communication: Discussed in detail with the patient and son, daughter is the POA , all imaging results, lab results explained to the patient   Disposition Plan:  Anticipate discharge in 3-4 days      Consultants:  Critical care  Cardiology  Cardiothoracic surgery  Procedures:  None  Antibiotics: Anti-infectives    Start     Dose/Rate Route Frequency Ordered Stop   01/23/16 0600  vancomycin (VANCOCIN) IVPB 750 mg/150 ml premix     750 mg 150 mL/hr over 60 Minutes Intravenous Every 24 hours 01/23/16 0232     01/23/16 0200  piperacillin-tazobactam (ZOSYN) IVPB 3.375 g     3.375 g 12.5 mL/hr over 240 Minutes Intravenous Every 8 hours 01/22/16 2237     01/22/16 2245  vancomycin (VANCOCIN) IVPB 1000 mg/200 mL premix     1,000 mg 200 mL/hr over 60 Minutes Intravenous  Once 01/22/16 2235 01/23/16 0021         HPI/Subjective: Afebrile overnight, continues to complain of right-sided chest pain  Objective: Filed Vitals:   01/24/16 0343 01/24/16 0400 01/24/16 0500 01/24/16 0749  BP:  152/73  150/72  Pulse:  73  67  Temp: 98 F (36.7 C)  98 F (36.7 C)  TempSrc: Oral   Oral  Resp:  23  26  Height:      Weight:   77 kg (169 lb 12.1 oz)   SpO2:  99%  100%    Intake/Output Summary (Last 24 hours) at 01/24/16 5462 Last data filed at 01/24/16 0800  Gross per 24 hour  Intake   1271 ml  Output    550 ml  Net    721 ml    Exam: General: Elderly female, resting in bed, in NAD. Neuro: A&O x 3, non-focal.  HEENT: [place]/AT. PERRL, sclerae anicteric. Cardiovascular: RRR, no M/R/G. Tenderness of right lateral  chest wall. Lungs: Respirations even and unlabored. CTA bilaterally, No W/R/R. Abdomen: Obese. BS x 4, soft, NT/ND.  Musculoskeletal: No gross deformities, no edema.  Skin: Intact, warm, no rashes.  Data Review   Micro Results Recent Results (from the past 240 hour(s))  MRSA PCR Screening     Status: None   Collection Time: 01/22/16  8:18 PM  Result Value Ref Range Status   MRSA by PCR NEGATIVE NEGATIVE Final    Comment:        The GeneXpert MRSA Assay (FDA approved for NASAL specimens only), is one component of a comprehensive MRSA colonization surveillance program. It is not intended to diagnose MRSA infection nor to guide or monitor treatment for MRSA infections.   Gram stain     Status: None   Collection Time: 01/23/16 12:49 PM  Result Value Ref Range Status   Specimen Description FLUID RIGHT PLEURAL  Final   Special Requests NONE  Final   Gram Stain   Final    ABUNDANT WBC PRESENT,BOTH PMN AND MONONUCLEAR NO ORGANISMS SEEN    Report Status 01/23/2016 FINAL  Final    Radiology Reports Dg Chest 1 View  01/23/2016  CLINICAL DATA:  Post right thoracentesis EXAM: CHEST 1 VIEW COMPARISON:  3/20/ 17 FINDINGS: Cardiomediastinal silhouette is stable. There is small residual partial loculated right pleural effusion. No pneumothorax. Persistent bilateral basilar atelectasis. IMPRESSION: Small residual partial loculated right pleural effusion. No pneumothorax. Bilateral basilar atelectasis. No pulmonary edema. Electronically Signed   By: Natasha Mead M.D.   On: 01/23/2016 13:49   Dg Chest Port 1 View  01/22/2016  CLINICAL DATA:  Respiratory failure and chest pain EXAM: PORTABLE CHEST 1 VIEW COMPARISON:  None. FINDINGS: Cardiac shadow is not enlarged. Right-sided pleural effusion is noted. There is likely underlying right basilar infiltrate. Some minimal atelectasis is noted in the left base. No acute bony abnormality is seen. IMPRESSION: Right pleural effusion and likely  underlying infiltrate. Left basilar atelectasis is noted. Electronically Signed   By: Alcide Clever M.D.   On: 01/22/2016 21:53     CBC  Recent Labs Lab 01/22/16 2203 01/23/16 0605 01/24/16 0831  WBC 9.8 9.1 7.8  HGB 9.6* 9.4* 10.3*  HCT 31.2* 30.6* 32.3*  PLT 336 354 379  MCV 87.9 86.7 86.1  MCH 27.0 26.6 27.5  MCHC 30.8 30.7 31.9  RDW 15.7* 15.8* 15.3    Chemistries   Recent Labs Lab 01/22/16 2203 01/23/16 0605  NA 138 138  K 4.2 3.4*  CL 106 105  CO2 20* 23  GLUCOSE 181* 96  BUN 17 15  CREATININE 1.14* 1.17*  CALCIUM 7.7* 7.8*  MG 1.6* 1.6*  AST 19  --   ALT 11*  --   ALKPHOS 77  --   BILITOT 0.2*  --    ------------------------------------------------------------------------------------------------------------------ estimated  creatinine clearance is 42.2 mL/min (by C-G formula based on Cr of 1.17). ------------------------------------------------------------------------------------------------------------------  Recent Labs  01/22/16 2203  HGBA1C 11.7*   ------------------------------------------------------------------------------------------------------------------ No results for input(s): CHOL, HDL, LDLCALC, TRIG, CHOLHDL, LDLDIRECT in the last 72 hours. ------------------------------------------------------------------------------------------------------------------  Recent Labs  01/22/16 2247  TSH 0.523   ------------------------------------------------------------------------------------------------------------------ No results for input(s): VITAMINB12, FOLATE, FERRITIN, TIBC, IRON, RETICCTPCT in the last 72 hours.  Coagulation profile  Recent Labs Lab 01/23/16 0126  INR 1.41    No results for input(s): DDIMER in the last 72 hours.  Cardiac Enzymes  Recent Labs Lab 01/22/16 2203  TROPONINI 0.07*   ------------------------------------------------------------------------------------------------------------------ Invalid input(s):  POCBNP   CBG:  Recent Labs Lab 01/23/16 1204 01/23/16 1624 01/23/16 1945 01/24/16 0021 01/24/16 0336  GLUCAP 122* 174* 206* 85 74       Studies: Dg Chest 1 View  01/23/2016  CLINICAL DATA:  Post right thoracentesis EXAM: CHEST 1 VIEW COMPARISON:  3/20/ 17 FINDINGS: Cardiomediastinal silhouette is stable. There is small residual partial loculated right pleural effusion. No pneumothorax. Persistent bilateral basilar atelectasis. IMPRESSION: Small residual partial loculated right pleural effusion. No pneumothorax. Bilateral basilar atelectasis. No pulmonary edema. Electronically Signed   By: Natasha Mead M.D.   On: 01/23/2016 13:49   Dg Chest Port 1 View  01/22/2016  CLINICAL DATA:  Respiratory failure and chest pain EXAM: PORTABLE CHEST 1 VIEW COMPARISON:  None. FINDINGS: Cardiac shadow is not enlarged. Right-sided pleural effusion is noted. There is likely underlying right basilar infiltrate. Some minimal atelectasis is noted in the left base. No acute bony abnormality is seen. IMPRESSION: Right pleural effusion and likely underlying infiltrate. Left basilar atelectasis is noted. Electronically Signed   By: Alcide Clever M.D.   On: 01/22/2016 21:53      Lab Results  Component Value Date   HGBA1C 11.7* 01/22/2016   Lab Results  Component Value Date   CREATININE 1.17* 01/23/2016       Scheduled Meds: . aspirin  81 mg Oral Daily  . insulin aspart  0-15 Units Subcutaneous 6 times per day  . levothyroxine  50 mcg Intravenous Daily  . piperacillin-tazobactam (ZOSYN)  IV  3.375 g Intravenous Q8H  . predniSONE  10 mg Oral Q breakfast  . sodium chloride flush  10-40 mL Intracatheter Q12H  . vancomycin  750 mg Intravenous Q24H   Continuous Infusions: . sodium chloride 75 mL/hr at 01/24/16 3267  . heparin 1,450 Units/hr (01/24/16 0700)    Active Problems:   Pulmonary embolism (HCC)   Pleural effusion   Pericardial effusion   Rheumatoid arthritis with positive rheumatoid  factor (HCC)   Pressure ulcer   Pulmonary embolism with acute cor pulmonale (HCC)   Pleural effusion on right   Shortness of breath   Hypoxemia    Time spent: 45 minutes   [facility]  [facility] Pager [phone]. If 7PM-7AM, please contact night-coverage at [email or web address], password [facility] 01/24/2016, 9:37 AM  LOS: 2 days

## 2016-01-24 NOTE — Progress Notes (Signed)
ANTICOAGULATION CONSULT NOTE  Pharmacy Consult for heparin Indication: pulmonary embolus  Allergies  Allergen Reactions  . Cephalexin Other (See Comments)    Pt states since she had meningitis, she not able to take antibiotic, but did not give reactions.   . Codeine Nausea And Vomiting    Patient Measurements: Height: 5\' 6"  (167.6 cm) Weight: 169 lb 12.1 oz (77 kg) IBW/kg (Calculated) : 59.3 Heparin Dosing Weight: 75.1 kg  Vital Signs: Temp: 98 F (36.7 C) (03/22 0343) Temp Source: Oral (03/22 0343) BP: 152/73 mmHg (03/22 0400) Pulse Rate: 73 (03/22 0400)  Labs:  Recent Labs  01/22/16 2203 01/23/16 0126 01/23/16 0605 01/23/16 1805  HGB 9.6*  --  9.4*  --   HCT 31.2*  --  30.6*  --   PLT 336  --  354  --   APTT  --  74*  --   --   LABPROT  --  17.3*  --   --   INR  --  1.41  --   --   HEPARINUNFRC  --   --  0.26* 0.42  CREATININE 1.14*  --  1.17*  --   TROPONINI 0.07*  --   --   --     Estimated Creatinine Clearance: 42.2 mL/min (by C-G formula based on Cr of 1.17).  Assessment: 80 y.o. female with PE on heparin at 1450 units/hr. HL is therapeutic at 0.42. H/H low stable. Plt wnl. No overt bleeding noted per RN.   Goal of Therapy:  Heparin level 0.3-0.7 units/ml Monitor platelets by anticoagulation protocol: Yes   Plan:  Continue heparin at 1450 units/hr Daily HL/CBC  62 PharmD., BCPS Clinical Pharmacist Pager [phone] 01/24/2016 7:45 AM

## 2016-01-24 NOTE — Progress Notes (Signed)
Inpatient Diabetes Program Recommendations  AACE/ADA: New Consensus Statement on Inpatient Glycemic Control (2015)  Target Ranges:  Prepandial:   less than 140 mg/dL      Peak postprandial:   less than 180 mg/dL (1-2 hours)      Critically ill patients:  140 - 180 mg/dL   Review of Glycemic Control  Diabetes history: DM2 Outpatient Diabetes medications: Lantus 12 units QHS Current orders for Inpatient glycemic control: Novolog moderate Q4H  Results for ALTIE, SAVARD (MRN [id number]) as of 01/24/2016 11:03  Ref. Range 01/23/2016 12:04 01/23/2016 16:24 01/23/2016 19:45 01/24/2016 00:21 01/24/2016 03:36  Glucose-Capillary Latest Ref Range: 65-99 mg/dL 710 (H) 626 (H) 948 (H) 85 74  Results for REXINE, GOWENS (MRN [id number]) as of 01/24/2016 11:03  Ref. Range 01/22/2016 22:03  Hemoglobin A1C Latest Ref Range: 4.8-5.6 % 11.7 (H)    HgbA1C may not be accurate with low H/H. Good control without Lantus at present.  Will continue to follow. Thank you. Ailene Ards, RD, LDN, CDE Inpatient Diabetes Coordinator [phone]

## 2016-01-24 NOTE — Consult Note (Signed)
[address]       [place] [address]             [phone]      Cardiothoracic Surgery Consultation  Reason for Consult: Large loculated right pleural effusion Referring Physician: Dr. Derrek Gu  Kirsten Gallegos is an 80 y.o. female.  HPI:   The patient is a 80 year old woman with a history of rheumatoid arthritis and decreased mobility who was admitted to [facility] in early March for UTI and mild DKA. She says that they did not do anything for her and she was discharged on 3/13//2017. She was reportedly very debilitated but refused home health or rehab and went back to live with her son. On 01/21/2016 she returned to [place] with right sided chest pain and shortness of breath and fever to 103. In the ED she had a CTA that showed a RLL segmental PE as well as a loculated right pleural effusion and multiple rib fractures in various stages of healing. She denied any trauma but had been coughing and sneezing and has been on chroni steroids for RA. She had increasing oxygen requirements and there was concern about her worsening clinical state so she was transferred to [facility] on the evening of 01/22/2016. She had an attempt at US guided thoracentesis yesterday but only a small amount of fluid could be removed. Abundant WBC but no organisms on gram stain and culture negative so far. She had a repeat CT here today that shows a persistent large loculated right pleural effusion and compressive atelectasis or consolidation of the right lower lobe.   Past Medical History  Diagnosis Date  . Reflux   . Hypertension   . Rheumatoid arthritis ([place])   . Heart attack [facility])     History reviewed. No pertinent past surgical history.  Family History  Problem Relation Age of Onset  . Heart attack Mother   . Cancer Father     Social History:  reports that she has never smoked. She has never used smokeless tobacco. She reports that she does not drink alcohol or use illicit  drugs.  Allergies:  Allergies  Allergen Reactions  . Cephalexin Other (See Comments)    Pt states since she had meningitis, she not able to take antibiotic, but did not give reactions.   . Codeine Nausea And Vomiting    Medications:  I have reviewed the patient's current medications. Prior to Admission:  Facility-administered medications prior to admission  Medication Dose Route Frequency Provider Last Rate Last Dose  . triamcinolone acetonide (KENALOG) 10 MG/ML injection 10 mg  10 mg Other Once Harriet Masson, DPM      . triamcinolone acetonide (KENALOG) 10 MG/ML injection 10 mg  10 mg Other Once Landis Martins, DPM       Prescriptions prior to admission  Medication Sig Dispense Refill Last Dose  . ALPRAZolam (XANAX) 0.25 MG tablet Take 0.25 mg by mouth 2 (two) times daily as needed for anxiety.   0 maybe 3/19  . aspirin EC 81 MG tablet Take 81 mg by mouth daily.   maybe 3/19  . furosemide (LASIX) 20 MG tablet Take 10 mg by mouth daily.  0 maybe 3/19  . glimepiride (AMARYL) 1 MG tablet Take 1 mg by mouth 2 (two) times daily.  0 maybe 3/19  . HYDROcodone-acetaminophen (NORCO) 10-325 MG per tablet Take 1 tablet by mouth every 8 (eight) hours as needed (pain).    maybe  3/19  . Insulin Glargine (LANTUS SOLOSTAR) 100 UNIT/ML Solostar Pen Inject 12 Units into the skin at bedtime.   01/20/2016 at 2000  . levothyroxine (SYNTHROID, LEVOTHROID) 100 MCG tablet Take 100 mcg by mouth daily.  0 maybe 3/19  . Liniments (BLUE-EMU SUPER STRENGTH EX) Apply 1 application topically daily as needed (neck pain).   01/21/2016  . metoprolol (LOPRESSOR) 50 MG tablet Take 50 mg by mouth 3 (three) times daily.    maybe 3/19  . neomycin-bacitracin-polymyxin (NEOSPORIN) OINT Apply 1 application topically daily as needed for wound care (foot ulcers).   unknown  . simvastatin (ZOCOR) 20 MG tablet Take 20 mg by mouth daily.   maybe 3/19  . silver sulfADIAZINE (SILVADENE) 1 % cream Apply 1 application topically daily.  (Patient not taking: Reported on 01/23/2016) 50 g 2 Not Taking at Unknown time   Scheduled: . aspirin  81 mg Oral Daily  . [START ON 01/25/2016] levothyroxine  100 mcg Oral QAC breakfast  . piperacillin-tazobactam (ZOSYN)  IV  3.375 g Intravenous Q8H  . predniSONE  10 mg Oral Q breakfast  . sodium chloride flush  10-40 mL Intracatheter Q12H  . vancomycin  750 mg Intravenous Q24H   Continuous: . sodium chloride 75 mL/hr at 01/24/16 6629  . heparin 1,450 Units/hr (01/24/16 2143)   UTM:LYYTKP chloride, fentaNYL (SUBLIMAZE) injection, HYDROcodone-acetaminophen, sodium chloride flush Anti-infectives    Start     Dose/Rate Route Frequency Ordered Stop   01/23/16 0600  vancomycin (VANCOCIN) IVPB 750 mg/150 ml premix     750 mg 150 mL/hr over 60 Minutes Intravenous Every 24 hours 01/23/16 0232     01/23/16 0200  piperacillin-tazobactam (ZOSYN) IVPB 3.375 g     3.375 g 12.5 mL/hr over 240 Minutes Intravenous Every 8 hours 01/22/16 2237     01/22/16 2245  vancomycin (VANCOCIN) IVPB 1000 mg/200 mL premix     1,000 mg 200 mL/hr over 60 Minutes Intravenous  Once 01/22/16 2235 01/23/16 0021      Results for orders placed or performed during the hospital encounter of 01/22/16 (from the past 48 hour(s))  Comprehensive metabolic panel     Status: Abnormal   Collection Time: 01/22/16 10:03 PM  Result Value Ref Range   Sodium 138 135 - 145 mmol/L   Potassium 4.2 3.5 - 5.1 mmol/L   Chloride 106 101 - 111 mmol/L   CO2 20 (L) 22 - 32 mmol/L   Glucose, Bld 181 (H) 65 - 99 mg/dL   BUN 17 6 - 20 mg/dL   Creatinine, Ser 1.14 (H) 0.44 - 1.00 mg/dL   Calcium 7.7 (L) 8.9 - 10.3 mg/dL   Total Protein 5.6 (L) 6.5 - 8.1 g/dL   Albumin 1.9 (L) 3.5 - 5.0 g/dL   AST 19 15 - 41 U/L   ALT 11 (L) 14 - 54 U/L   Alkaline Phosphatase 77 38 - 126 U/L   Total Bilirubin 0.2 (L) 0.3 - 1.2 mg/dL   GFR calc non Af Amer 45 (L) >60 mL/min   GFR calc Af Amer 52 (L) >60 mL/min    Comment: (NOTE) The eGFR has been  calculated using the CKD EPI equation. This calculation has not been validated in all clinical situations. eGFR's persistently <60 mL/min signify possible Chronic Kidney Disease.    Anion gap 12 5 - 15  Magnesium     Status: Abnormal   Collection Time: 01/22/16 10:03 PM  Result Value Ref Range   Magnesium 1.6 (L) 1.7 -  2.4 mg/dL  Troponin I     Status: Abnormal   Collection Time: 01/22/16 10:03 PM  Result Value Ref Range   Troponin I 0.07 (H) <0.031 ng/mL    Comment:        PERSISTENTLY INCREASED TROPONIN VALUES IN THE RANGE OF 0.04-0.49 ng/mL CAN BE SEEN IN:       -UNSTABLE ANGINA       -CONGESTIVE HEART FAILURE       -MYOCARDITIS       -CHEST TRAUMA       -ARRYHTHMIAS       -LATE PRESENTING MYOCARDIAL INFARCTION       -COPD   CLINICAL FOLLOW-UP RECOMMENDED.   CBC     Status: Abnormal   Collection Time: 01/22/16 10:03 PM  Result Value Ref Range   WBC 9.8 4.0 - 10.5 K/uL   RBC 3.55 (L) 3.87 - 5.11 MIL/uL   Hemoglobin 9.6 (L) 12.0 - 15.0 g/dL   HCT 31.2 (L) 36.0 - 46.0 %   MCV 87.9 78.0 - 100.0 fL   MCH 27.0 26.0 - 34.0 pg   MCHC 30.8 30.0 - 36.0 g/dL   RDW 15.7 (H) 11.5 - 15.5 %   Platelets 336 150 - 400 K/uL  Phosphorus     Status: None   Collection Time: 01/22/16 10:03 PM  Result Value Ref Range   Phosphorus 2.5 2.5 - 4.6 mg/dL  Hemoglobin A1c     Status: Abnormal   Collection Time: 01/22/16 10:03 PM  Result Value Ref Range   Hgb A1c MFr Bld 11.7 (H) 4.8 - 5.6 %    Comment: (NOTE)         Pre-diabetes: 5.7 - 6.4         Diabetes: >6.4         Glycemic control for adults with diabetes: <7.0    Mean Plasma Glucose 289 mg/dL    Comment: (NOTE) Performed At: [facility] [address] [place], [place] [id number] Lindon Romp MD [id number]   TSH     Status: None   Collection Time: 01/22/16 10:47 PM  Result Value Ref Range   TSH 0.523 0.350 - 4.500 uIU/mL  Glucose, capillary     Status: Abnormal   Collection Time: 01/23/16 12:57 AM  Result  Value Ref Range   Glucose-Capillary 157 (H) 65 - 99 mg/dL   Comment 1 Capillary Specimen   APTT     Status: Abnormal   Collection Time: 01/23/16  1:26 AM  Result Value Ref Range   aPTT 74 (H) 24 - 37 seconds    Comment:        IF BASELINE aPTT IS ELEVATED, SUGGEST PATIENT RISK ASSESSMENT BE USED TO DETERMINE APPROPRIATE ANTICOAGULANT THERAPY.   Protime-INR     Status: Abnormal   Collection Time: 01/23/16  1:26 AM  Result Value Ref Range   Prothrombin Time 17.3 (H) 11.6 - 15.2 seconds   INR 1.41 0.00 - 1.49  Type and screen [place]     Status: None (Preliminary result)   Collection Time: 01/23/16  1:26 AM  Result Value Ref Range   ABO/RH(D) O POS    Antibody Screen POS    Sample Expiration 01/26/2016    Antibody Identification ANTI CW    DAT, IgG NEG    Unit Number [id number]    Blood Component Type RBC LR PHER2    Unit division 00    Status of Unit ALLOCATED    Transfusion  Status OK TO TRANSFUSE    Crossmatch Result COMPATIBLE    Unit Number [id number]    Blood Component Type RED CELLS,LR    Unit division 00    Status of Unit ALLOCATED    Transfusion Status OK TO TRANSFUSE    Crossmatch Result COMPATIBLE   ABO/Rh     Status: None   Collection Time: 01/23/16  1:26 AM  Result Value Ref Range   ABO/RH(D) O POS   Glucose, capillary     Status: None   Collection Time: 01/23/16  5:25 AM  Result Value Ref Range   Glucose-Capillary 88 65 - 99 mg/dL   Comment 1 Capillary Specimen   CBC     Status: Abnormal   Collection Time: 01/23/16  6:05 AM  Result Value Ref Range   WBC 9.1 4.0 - 10.5 K/uL   RBC 3.53 (L) 3.87 - 5.11 MIL/uL   Hemoglobin 9.4 (L) 12.0 - 15.0 g/dL   HCT 30.6 (L) 36.0 - 46.0 %   MCV 86.7 78.0 - 100.0 fL   MCH 26.6 26.0 - 34.0 pg   MCHC 30.7 30.0 - 36.0 g/dL   RDW 15.8 (H) 11.5 - 15.5 %   Platelets 354 150 - 400 K/uL  Basic metabolic panel     Status: Abnormal   Collection Time: 01/23/16  6:05 AM  Result Value Ref Range    Sodium 138 135 - 145 mmol/L   Potassium 3.4 (L) 3.5 - 5.1 mmol/L   Chloride 105 101 - 111 mmol/L   CO2 23 22 - 32 mmol/L   Glucose, Bld 96 65 - 99 mg/dL   BUN 15 6 - 20 mg/dL   Creatinine, Ser 1.17 (H) 0.44 - 1.00 mg/dL   Calcium 7.8 (L) 8.9 - 10.3 mg/dL   GFR calc non Af Amer 44 (L) >60 mL/min   GFR calc Af Amer 51 (L) >60 mL/min    Comment: (NOTE) The eGFR has been calculated using the CKD EPI equation. This calculation has not been validated in all clinical situations. eGFR's persistently <60 mL/min signify possible Chronic Kidney Disease.    Anion gap 10 5 - 15  Magnesium     Status: Abnormal   Collection Time: 01/23/16  6:05 AM  Result Value Ref Range   Magnesium 1.6 (L) 1.7 - 2.4 mg/dL  Phosphorus     Status: Abnormal   Collection Time: 01/23/16  6:05 AM  Result Value Ref Range   Phosphorus 1.9 (L) 2.5 - 4.6 mg/dL  Heparin level (unfractionated)     Status: Abnormal   Collection Time: 01/23/16  6:05 AM  Result Value Ref Range   Heparin Unfractionated 0.26 (L) 0.30 - 0.70 IU/mL    Comment:        IF HEPARIN RESULTS ARE BELOW EXPECTED VALUES, AND PATIENT DOSAGE HAS BEEN CONFIRMED, SUGGEST FOLLOW UP TESTING OF ANTITHROMBIN III LEVELS.   Glucose, capillary     Status: None   Collection Time: 01/23/16  8:54 AM  Result Value Ref Range   Glucose-Capillary 97 65 - 99 mg/dL  Glucose, capillary     Status: Abnormal   Collection Time: 01/23/16 12:04 PM  Result Value Ref Range   Glucose-Capillary 122 (H) 65 - 99 mg/dL   Comment 1 Capillary Specimen   Lactate dehydrogenase (CSF, pleural or peritoneal fluid)     Status: Abnormal   Collection Time: 01/23/16 12:49 PM  Result Value Ref Range   LD, Fluid 546 (H) 3 - 23 U/L  Comment: (NOTE) Results should be evaluated in conjunction with serum values    Fluid Type-FLDH Pleural R   Protein, pleural or peritoneal fluid     Status: None   Collection Time: 01/23/16 12:49 PM  Result Value Ref Range   Total protein, fluid 3.4  g/dL    Comment: (NOTE) No normal range established for this test Results should be evaluated in conjunction with serum values    Fluid Type-FTP Pleural R   Body fluid cell count with differential     Status: Abnormal   Collection Time: 01/23/16 12:49 PM  Result Value Ref Range   Fluid Type-FCT Pleural R    Color, Fluid RED (A) YELLOW   Appearance, Fluid TURBID (A) CLEAR   WBC, Fluid 1509 (H) 0 - 1000 cu mm   Neutrophil Count, Fluid 71 (H) 0 - 25 %   Lymphs, Fluid 5 %   Monocyte-Macrophage-Serous Fluid 24 (L) 50 - 90 %   Eos, Fluid 0 %   Other Cells, Fluid  %    FEW PYKNOTIC NEUTROPHILS, FEW VACUOLATED NEUTROPHILS.  Glucose, pleural or peritoneal fluid     Status: None   Collection Time: 01/23/16 12:49 PM  Result Value Ref Range   Glucose, Fluid 51 mg/dL    Comment: (NOTE) No normal range established for this test Results should be evaluated in conjunction with serum values    Fluid Type-FGLU Pleural R   PH, Body Fluid     Status: None   Collection Time: 01/23/16 12:49 PM  Result Value Ref Range   pH, Body Fluid 7.7 Not Estab.    Comment: (NOTE) This test was developed and its performance characteristics determined by [facility]. It has not been cleared or approved by the Food and Drug Administration. Performed At: [facility] [place], [place] [id number] Lindon Romp MD YS:[id number]    Source of Sample PLEU     Comment: RIGHT  Culture, body fluid-bottle     Status: None (Preliminary result)   Collection Time: 01/23/16 12:49 PM  Result Value Ref Range   Specimen Description FLUID RIGHT PLEURAL    Special Requests NONE    Culture NO GROWTH 1 DAY    Report Status PENDING   Gram stain     Status: None   Collection Time: 01/23/16 12:49 PM  Result Value Ref Range   Specimen Description FLUID RIGHT PLEURAL    Special Requests NONE    Gram Stain      ABUNDANT WBC PRESENT,BOTH PMN AND MONONUCLEAR NO ORGANISMS SEEN    Report Status 01/23/2016  FINAL   Glucose, capillary     Status: Abnormal   Collection Time: 01/23/16  4:24 PM  Result Value Ref Range   Glucose-Capillary 174 (H) 65 - 99 mg/dL   Comment 1 Capillary Specimen   Heparin level (unfractionated)     Status: None   Collection Time: 01/23/16  6:05 PM  Result Value Ref Range   Heparin Unfractionated 0.42 0.30 - 0.70 IU/mL    Comment:        IF HEPARIN RESULTS ARE BELOW EXPECTED VALUES, AND PATIENT DOSAGE HAS BEEN CONFIRMED, SUGGEST FOLLOW UP TESTING OF ANTITHROMBIN III LEVELS.   Glucose, capillary     Status: Abnormal   Collection Time: 01/23/16  7:45 PM  Result Value Ref Range   Glucose-Capillary 206 (H) 65 - 99 mg/dL  Glucose, capillary     Status: None   Collection Time: 01/24/16 12:21 AM  Result Value  Ref Range   Glucose-Capillary 85 65 - 99 mg/dL   Comment 1 Capillary Specimen   Glucose, capillary     Status: None   Collection Time: 01/24/16  3:36 AM  Result Value Ref Range   Glucose-Capillary 74 65 - 99 mg/dL   Comment 1 Capillary Specimen   Glucose, capillary     Status: None   Collection Time: 01/24/16  7:51 AM  Result Value Ref Range   Glucose-Capillary 92 65 - 99 mg/dL   Comment 1 Capillary Specimen   Glucose, capillary     Status: None   Collection Time: 01/24/16  7:54 AM  Result Value Ref Range   Glucose-Capillary 89 65 - 99 mg/dL   Comment 1 Capillary Specimen   Procalcitonin     Status: None   Collection Time: 01/24/16  8:31 AM  Result Value Ref Range   Procalcitonin 0.13 ng/mL    Comment:        Interpretation: PCT (Procalcitonin) <= 0.5 ng/mL: Systemic infection (sepsis) is not likely. Local bacterial infection is possible. (NOTE)         ICU PCT Algorithm               Non ICU PCT Algorithm    ----------------------------     ------------------------------         PCT < 0.25 ng/mL                 PCT < 0.1 ng/mL     Stopping of antibiotics            Stopping of antibiotics       strongly encouraged.               strongly  encouraged.    ----------------------------     ------------------------------       PCT level decrease by               PCT < 0.25 ng/mL       >= 80% from peak PCT       OR PCT 0.25 - 0.5 ng/mL          Stopping of antibiotics                                             encouraged.     Stopping of antibiotics           encouraged.    ----------------------------     ------------------------------       PCT level decrease by              PCT >= 0.25 ng/mL       < 80% from peak PCT        AND PCT >= 0.5 ng/mL            Continuin g antibiotics                                              encouraged.       Continuing antibiotics            encouraged.    ----------------------------     ------------------------------     PCT level increase compared          PCT >  0.5 ng/mL         with peak PCT AND          PCT >= 0.5 ng/mL             Escalation of antibiotics                                          strongly encouraged.      Escalation of antibiotics        strongly encouraged.   CBC     Status: Abnormal   Collection Time: 01/24/16  8:31 AM  Result Value Ref Range   WBC 7.8 4.0 - 10.5 K/uL   RBC 3.75 (L) 3.87 - 5.11 MIL/uL   Hemoglobin 10.3 (L) 12.0 - 15.0 g/dL   HCT 32.3 (L) 36.0 - 46.0 %   MCV 86.1 78.0 - 100.0 fL   MCH 27.5 26.0 - 34.0 pg   MCHC 31.9 30.0 - 36.0 g/dL   RDW 15.3 11.5 - 15.5 %   Platelets 379 150 - 400 K/uL  Comprehensive metabolic panel     Status: Abnormal   Collection Time: 01/24/16  8:31 AM  Result Value Ref Range   Sodium 139 135 - 145 mmol/L   Potassium 3.5 3.5 - 5.1 mmol/L   Chloride 103 101 - 111 mmol/L   CO2 23 22 - 32 mmol/L   Glucose, Bld 89 65 - 99 mg/dL   BUN 13 6 - 20 mg/dL   Creatinine, Ser 0.99 0.44 - 1.00 mg/dL   Calcium 8.3 (L) 8.9 - 10.3 mg/dL   Total Protein 5.8 (L) 6.5 - 8.1 g/dL   Albumin 1.8 (L) 3.5 - 5.0 g/dL   AST 16 15 - 41 U/L   ALT 10 (L) 14 - 54 U/L   Alkaline Phosphatase 76 38 - 126 U/L   Total Bilirubin 0.5 0.3 - 1.2  mg/dL   GFR calc non Af Amer 54 (L) >60 mL/min   GFR calc Af Amer >60 >60 mL/min    Comment: (NOTE) The eGFR has been calculated using the CKD EPI equation. This calculation has not been validated in all clinical situations. eGFR's persistently <60 mL/min signify possible Chronic Kidney Disease.    Anion gap 13 5 - 15  Heparin level (unfractionated)     Status: None   Collection Time: 01/24/16  8:31 AM  Result Value Ref Range   Heparin Unfractionated 0.42 0.30 - 0.70 IU/mL    Comment:        IF HEPARIN RESULTS ARE BELOW EXPECTED VALUES, AND PATIENT DOSAGE HAS BEEN CONFIRMED, SUGGEST FOLLOW UP TESTING OF ANTITHROMBIN III LEVELS.   Glucose, capillary     Status: Abnormal   Collection Time: 01/24/16 11:55 AM  Result Value Ref Range   Glucose-Capillary 156 (H) 65 - 99 mg/dL  Glucose, capillary     Status: Abnormal   Collection Time: 01/24/16  4:45 PM  Result Value Ref Range   Glucose-Capillary 167 (H) 65 - 99 mg/dL   Comment 1 Venous Specimen     Dg Chest 1 View  01/23/2016  CLINICAL DATA:  Post right thoracentesis EXAM: CHEST 1 VIEW COMPARISON:  3/20/ 17 FINDINGS: Cardiomediastinal silhouette is stable. There is small residual partial loculated right pleural effusion. No pneumothorax. Persistent bilateral basilar atelectasis. IMPRESSION: Small residual partial loculated right pleural effusion. No pneumothorax. Bilateral basilar atelectasis. No pulmonary edema.  Electronically Signed   By: Lahoma Crocker M.D.   On: 01/23/2016 13:49   Ct Angio Chest Pe W/cm &/or Wo Cm  01/24/2016  CLINICAL DATA:  Shortness of breath for few days EXAM: CT ANGIOGRAPHY CHEST WITH CONTRAST TECHNIQUE: Multidetector CT imaging of the chest was performed using the standard protocol during bolus administration of intravenous contrast. Multiplanar CT image reconstructions and MIPs were obtained to evaluate the vascular anatomy. CONTRAST:  41m OMNIPAQUE IOHEXOL 350 MG/ML SOLN COMPARISON:  Plain film from the  previous day FINDINGS: The left lung is well aerated bilaterally. Some very minimal dependent atelectatic changes are seen. No sizable effusion is noted. On the right there is a large pleural effusion identified with associated lower lobe consolidation. No definitive mass lesion is seen. The thoracic inlet shows no acute abnormality. The thoracic aorta shows calcification. The pulmonary artery is well visualized and demonstrates a normal branching pattern. Pulmonary artery is predominately within normal limits although in the anterior aspect of the right lower lobe a filling defect is noted consistent with acute pulmonary embolism. The more distal branches are not well appreciated. No evidence of right heart strain is noted. No hilar or mediastinal adenopathy is noted. Heavy coronary calcifications are seen. The visualized upper abdomen shows fatty infiltration of the liver. The bony structures show degenerative change of the thoracic spine. A healing fracture of the right sixth rib laterally is noted. No other definitive rib fractures are seen. Review of the MIP images confirms the above findings. IMPRESSION: Right lower lobe pulmonary embolism. Additionally there is right lower lobe consolidation and associated large effusion. No evidence of right heart strain is noted. Mild left basilar atelectasis. Healing right sixth rib fracture. Critical Value/emergent results were called by telephone at the time of interpretation on 01/24/2016 at 1:46 pm to Dr. NReyne Dumas, who verbally acknowledged these results. Electronically Signed   By: MInez CatalinaM.D.   On: 01/24/2016 13:54   Dg Chest Port 1 View  01/22/2016  CLINICAL DATA:  Respiratory failure and chest pain EXAM: PORTABLE CHEST 1 VIEW COMPARISON:  None. FINDINGS: Cardiac shadow is not enlarged. Right-sided pleural effusion is noted. There is likely underlying right basilar infiltrate. Some minimal atelectasis is noted in the left base. No acute bony abnormality  is seen. IMPRESSION: Right pleural effusion and likely underlying infiltrate. Left basilar atelectasis is noted. Electronically Signed   By: MInez CatalinaM.D.   On: 01/22/2016 21:53    Review of Systems  Constitutional: Positive for fever and malaise/fatigue. Negative for chills.       Poor appetite  HENT: Negative.   Eyes: Negative.   Respiratory: Positive for cough and shortness of breath. Negative for hemoptysis.   Cardiovascular: Positive for chest pain and orthopnea.  Gastrointestinal: Negative.   Genitourinary: Negative.   Musculoskeletal: Positive for joint pain.  Skin: Negative.   Neurological: Negative.   Endo/Heme/Allergies: Negative.   Psychiatric/Behavioral: Negative.    Blood pressure 168/78, pulse 78, temperature 98.4 F (36.9 C), temperature source Oral, resp. rate 17, height 5' 6"  (1.676 m), weight 77 kg (169 lb 12.1 oz), SpO2 98 %. Physical Exam  Constitutional: She is oriented to person, place, and time.  Elderly frail-appearing woman in no distress  HENT:  Head: Normocephalic and atraumatic.  Mouth/Throat: Oropharynx is clear and moist.  Eyes: EOM are normal. Pupils are equal, round, and reactive to light.  Neck: Normal range of motion. Neck supple.  Cardiovascular: Normal rate, regular rhythm and normal heart  sounds.   No murmur heard. Respiratory: Effort normal. No respiratory distress.  Decreased breath sounds over the right lower lobe with crackles in bases.  GI: Soft. Bowel sounds are normal. There is no tenderness.  Musculoskeletal: She exhibits no edema.  Joint deformity from RA.  Lymphadenopathy:    She has no cervical adenopathy.  Neurological: She is alert and oriented to person, place, and time.  Skin: Skin is warm and dry.  Psychiatric: She has a normal mood and affect.   CLINICAL DATA: Shortness of breath for few days  EXAM: CT ANGIOGRAPHY CHEST WITH CONTRAST  TECHNIQUE: Multidetector CT imaging of the chest was performed using  the standard protocol during bolus administration of intravenous contrast. Multiplanar CT image reconstructions and MIPs were obtained to evaluate the vascular anatomy.  CONTRAST: 16m OMNIPAQUE IOHEXOL 350 MG/ML SOLN  COMPARISON: Plain film from the previous day  FINDINGS: The left lung is well aerated bilaterally. Some very minimal dependent atelectatic changes are seen. No sizable effusion is noted.  On the right there is a large pleural effusion identified with associated lower lobe consolidation. No definitive mass lesion is seen.  The thoracic inlet shows no acute abnormality. The thoracic aorta shows calcification. The pulmonary artery is well visualized and demonstrates a normal branching pattern. Pulmonary artery is predominately within normal limits although in the anterior aspect of the right lower lobe a filling defect is noted consistent with acute pulmonary embolism. The more distal branches are not well appreciated. No evidence of right heart strain is noted.  No hilar or mediastinal adenopathy is noted. Heavy coronary calcifications are seen.  The visualized upper abdomen shows fatty infiltration of the liver. The bony structures show degenerative change of the thoracic spine. A healing fracture of the right sixth rib laterally is noted. No other definitive rib fractures are seen.  Review of the MIP images confirms the above findings.  IMPRESSION: Right lower lobe pulmonary embolism. Additionally there is right lower lobe consolidation and associated large effusion. No evidence of right heart strain is noted.  Mild left basilar atelectasis.  Healing right sixth rib fracture.  Critical Value/emergent results were called by telephone at the time of interpretation on 01/24/2016 at 1:46 pm to Dr. NReyne Dumas, who verbally acknowledged these results.   Electronically Signed  By: MInez CatalinaM.D.  On: 01/24/2016  13:54   Assessment/Plan:  She has a large loculated right pleural effusion with compressive atelectasis and possibly consolidation of the right lower lobe. This was not able to be drained percutaneously and the only option is to do VATS or limited thoracotomy for drainage. She has a right lower lobe PE of unknown duration but no LE DVT. She would need to be off heparin for surgery and would not be able to start back on anticoagulation for at least a week. Even then she would be at increased risk for bleeding into the pleural space. I think the options are to place an IVC filter or ignore the CT finding of PE and stop the anticoagulation for a while. That decision is up to the medical service but she will not be able to have surgery until she is off heparin.   BGaye Pollack3/22/2017, 9:46 PM

## 2016-01-25 ENCOUNTER — Ambulatory Visit: Payer: Medicare Other | Admitting: Sports Medicine

## 2016-01-25 ENCOUNTER — Inpatient Hospital Stay (HOSPITAL_COMMUNITY): Payer: Medicare Other

## 2016-01-25 DIAGNOSIS — J90 Pleural effusion, not elsewhere classified: Secondary | ICD-10-CM

## 2016-01-25 DIAGNOSIS — R0600 Dyspnea, unspecified: Secondary | ICD-10-CM

## 2016-01-25 LAB — CBC
HCT: 29.5 % — ABNORMAL LOW (ref 36.0–46.0)
HEMOGLOBIN: 9.1 g/dL — AB (ref 12.0–15.0)
MCH: 26.6 pg (ref 26.0–34.0)
MCHC: 30.8 g/dL (ref 30.0–36.0)
MCV: 86.3 fL (ref 78.0–100.0)
PLATELETS: 366 10*3/uL (ref 150–400)
RBC: 3.42 MIL/uL — ABNORMAL LOW (ref 3.87–5.11)
RDW: 15.3 % (ref 11.5–15.5)
WBC: 7.5 10*3/uL (ref 4.0–10.5)

## 2016-01-25 LAB — GLUCOSE, CAPILLARY
GLUCOSE-CAPILLARY: 92 mg/dL (ref 65–99)
GLUCOSE-CAPILLARY: 139 mg/dL — AB (ref 65–99)
Glucose-Capillary: 100 mg/dL — ABNORMAL HIGH (ref 65–99)
Glucose-Capillary: 220 mg/dL — ABNORMAL HIGH (ref 65–99)

## 2016-01-25 LAB — BASIC METABOLIC PANEL
ANION GAP: 10 (ref 5–15)
BUN: 8 mg/dL (ref 6–20)
CALCIUM: 7.9 mg/dL — AB (ref 8.9–10.3)
CO2: 22 mmol/L (ref 22–32)
CREATININE: 0.93 mg/dL (ref 0.44–1.00)
Chloride: 106 mmol/L (ref 101–111)
GFR, EST NON AFRICAN AMERICAN: 58 mL/min — AB (ref >60)
GLUCOSE: 96 mg/dL (ref 65–99)
Potassium: 3.4 mmol/L — ABNORMAL LOW (ref 3.5–5.1)
Sodium: 138 mmol/L (ref 135–145)

## 2016-01-25 LAB — ECHOCARDIOGRAM COMPLETE
HEIGHTINCHES: 66 in
Weight: 2723.12 oz

## 2016-01-25 LAB — HEPARIN LEVEL (UNFRACTIONATED): HEPARIN UNFRACTIONATED: 0.46 [IU]/mL (ref 0.30–0.70)

## 2016-01-25 MED ORDER — METOPROLOL TARTRATE 25 MG PO TABS
25.0000 mg | ORAL_TABLET | Freq: Two times a day (BID) | ORAL | Status: DC
  Administered 2016-01-25 – 2016-02-02 (×16): 25 mg via ORAL
  Filled 2016-01-25 (×16): qty 1

## 2016-01-25 MED ORDER — MIDAZOLAM HCL 2 MG/2ML IJ SOLN
INTRAMUSCULAR | Status: AC | PRN
  Administered 2016-01-25: 1 mg via INTRAVENOUS
  Administered 2016-01-25: 0.5 mg via INTRAVENOUS

## 2016-01-25 MED ORDER — LIDOCAINE HCL 1 % IJ SOLN
INTRAMUSCULAR | Status: AC
  Filled 2016-01-25: qty 20

## 2016-01-25 MED ORDER — SODIUM CHLORIDE 0.9 % IV SOLN: Freq: Once | INTRAVENOUS | Status: AC

## 2016-01-25 MED ORDER — FENTANYL CITRATE (PF) 100 MCG/2ML IJ SOLN
INTRAMUSCULAR | Status: AC
  Filled 2016-01-25: qty 2

## 2016-01-25 MED ORDER — FENTANYL CITRATE (PF) 100 MCG/2ML IJ SOLN
INTRAMUSCULAR | Status: AC | PRN
  Administered 2016-01-25 (×2): 25 ug via INTRAVENOUS

## 2016-01-25 MED ORDER — ONDANSETRON HCL 4 MG/2ML IJ SOLN
4.0000 mg | Freq: Four times a day (QID) | INTRAMUSCULAR | Status: DC | PRN
  Administered 2016-01-25 (×2): 4 mg via INTRAVENOUS
  Filled 2016-01-25 (×2): qty 2

## 2016-01-25 MED ORDER — MIDAZOLAM HCL 2 MG/2ML IJ SOLN
INTRAMUSCULAR | Status: AC
  Filled 2016-01-25: qty 2

## 2016-01-25 MED ORDER — VANCOMYCIN HCL IN DEXTROSE 750-5 MG/150ML-% IV SOLN
750.0000 mg | Freq: Two times a day (BID) | INTRAVENOUS | Status: DC
  Administered 2016-01-25 – 2016-01-27 (×4): 750 mg via INTRAVENOUS
  Filled 2016-01-25 (×6): qty 150

## 2016-01-25 MED ORDER — IOHEXOL 300 MG/ML  SOLN
100.0000 mL | Freq: Once | INTRAMUSCULAR | Status: AC | PRN
  Administered 2016-01-25: 50 mL via INTRAVENOUS

## 2016-01-25 NOTE — Progress Notes (Signed)
Sister is POA [phone]  She will give consent for IVC and  VATS
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
## 2016-01-25 NOTE — Sedation Documentation (Signed)
02 decreased to 2 L [place] 

## 2016-01-25 NOTE — Sedation Documentation (Signed)
Patient is resting comfortably. 
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
## 2016-01-25 NOTE — Evaluation (Signed)
Physical Therapy Evaluation Patient Details Name: Kirsten Gallegos MRN: [id number] DOB: 1936/01/12 Today's Date: 01/25/2016   History of Present Illness  80 y.o. female with a history of CAD, RA, HTN, DM, HL and hypothyroidism who transferred from [facility] hospital 01/22/16 with PE  Clinical Impression  Patient demonstrates deficits in functional mobility as indicated below. Will need continued skilled PT to address deficits and maximize function. Will see as indicated and progress as tolerated.  OF NOTE: Patient + dizziness, nauseated BP assessed 168/79 and 164/79. HR mid 70s with activity.     Follow Up Recommendations Home health PT;Supervision/Assistance - 24 hour (Pt declines any ST SNF)    Equipment Recommendations  None recommended by PT    Recommendations for Other Services       Precautions / Restrictions Precautions Precautions: Fall Restrictions Weight Bearing Restrictions: No      Mobility  Bed Mobility Overal bed mobility: Needs Assistance Bed Mobility: Rolling;Sidelying to Sit Rolling: Min guard Sidelying to sit: Min assist       General bed mobility comments: Min assist to power up to EOB, increased time and effort noted to perform  Transfers Overall transfer level: Needs assistance Equipment used:  (Back of chair for UE support) Transfers: Sit to/from Stand Sit to Stand: Min assist;+2 physical assistance         General transfer comment: + dizziness and nauseated feeling in coming to standing. patient cues for upright posture and safety, assist to power up to standing initially.   Ambulation/Gait             General Gait Details: deferred secondary to nausea and dizziness  Stairs            Wheelchair Mobility    Modified Rankin (Stroke Patients Only)       Balance Overall balance assessment: History of Falls                                           Pertinent Vitals/Pain Pain Assessment: Faces Faces Pain  Scale: Hurts even more Pain Location: patient reports pain in her back at site of thorcentesis  Pain Descriptors / Indicators: Sore Pain Intervention(s): Limited activity within patient's tolerance;Monitored during session;Repositioned    Home Living Family/patient expects to be discharged to:: Private residence Living Arrangements: Children Available Help at Discharge: Family Type of Home: House       Home Layout: One level Home Equipment: Environmental consultant - 2 wheels      Prior Function Level of Independence: Needs assistance   Gait / Transfers Assistance Needed: supervision, utilized RW for ambulation     Comments: Son provided assist for activities secondary to patient co-morbidities and RA     Hand Dominance   Dominant Hand: Right    Extremity/Trunk Assessment   Upper Extremity Assessment: Defer to OT evaluation           Lower Extremity Assessment: Generalized weakness (+ arthritic changes noted)         Communication   Communication: HOH  Cognition Arousal/Alertness: Awake/alert Behavior During Therapy: Flat affect Overall Cognitive Status: Within Functional Limits for tasks assessed                      General Comments      Exercises        Assessment/Plan    PT Assessment Patient needs continued PT  services  PT Diagnosis Difficulty walking;Abnormality of gait;Generalized weakness;Acute pain   PT Problem List Decreased strength;Decreased range of motion;Decreased activity tolerance;Decreased balance;Decreased mobility;Decreased coordination;Cardiopulmonary status limiting activity;Pain  PT Treatment Interventions DME instruction;Gait training;Stair training;Therapeutic activities;Functional mobility training;Therapeutic exercise;Balance training;Patient/family education   PT Goals (Current goals can be found in the Care Plan section) Acute Rehab PT Goals Patient Stated Goal: to go home PT Goal Formulation: With patient Time For Goal  Achievement: 02/08/16 Potential to Achieve Goals: Good    Frequency Min 3X/week   Barriers to discharge        Co-evaluation               End of Session Equipment Utilized During Treatment: Gait belt Activity Tolerance: Treatment limited secondary to medical complications (Comment) (nausea and dizziness) Patient left: with bed alarm set;in bed;with call bell/phone within reach Nurse Communication: Mobility status         Time: 1000-1017 PT Time Calculation (min) (ACUTE ONLY): 17 min   Charges:   PT Evaluation $PT Eval Moderate Complexity: 1 Procedure     PT G CodesFabio Asa 2016-02-04, 11:17 AM  Charlotte Crumb, PT DPT  [phone]

## 2016-01-25 NOTE — Consult Note (Signed)
Chief Complaint: Patient was seen in consultation today for retrievable Inferior Vena Cava filter placement at the request of Dr Richarda Overlie  Referring Physician(s): Dr Susie Cassette  Supervising Physician: Richarda Overlie  History of Present Illness: Kirsten Gallegos is a 80 y.o. female   Transferred from [place] 01/22/16 Increasing sob; cough Known Rt PE---(on heparin drip) No DVT per doppler 3/21 in chart Right Loculated effusion  Rt thoracentesis was performed in Rad 3/21 50 cc blood tinged fluid High wbcs; no orgs Evaluation with Dr Eduardo Osier scheduled for VATS procedure soon Will have to come off Heparin for this procedure and stay off for at least 1 week or more per note  Request now for retrievable inferior vena cava filter placement Discussed with Dr Lowella Dandy and he approves procedure   Past Medical History  Diagnosis Date  . Reflux   . Hypertension   . Rheumatoid arthritis (HCC)   . Heart attack [facility])     History reviewed. No pertinent past surgical history.  Allergies: Cephalexin and Codeine  Medications: Prior to Admission medications   Medication Sig Start Date End Date Taking? Authorizing Provider  ALPRAZolam (XANAX) 0.25 MG tablet Take 0.25 mg by mouth 2 (two) times daily as needed for anxiety.  11/02/14  Yes Historical Provider, MD  aspirin EC 81 MG tablet Take 81 mg by mouth daily.   Yes Historical Provider, MD  furosemide (LASIX) 20 MG tablet Take 10 mg by mouth daily. 11/30/15  Yes Historical Provider, MD  glimepiride (AMARYL) 1 MG tablet Take 1 mg by mouth 2 (two) times daily. 01/15/16  Yes Historical Provider, MD  HYDROcodone-acetaminophen (NORCO) 10-325 MG per tablet Take 1 tablet by mouth every 8 (eight) hours as needed (pain).    Yes Historical Provider, MD  Insulin Glargine (LANTUS SOLOSTAR) 100 UNIT/ML Solostar Pen Inject 12 Units into the skin at bedtime.   Yes Historical Provider, MD  levothyroxine (SYNTHROID, LEVOTHROID) 100 MCG tablet Take 100  mcg by mouth daily. 12/24/15  Yes Historical Provider, MD  Liniments (BLUE-EMU SUPER STRENGTH EX) Apply 1 application topically daily as needed (neck pain).   Yes Historical Provider, MD  metoprolol (LOPRESSOR) 50 MG tablet Take 50 mg by mouth 3 (three) times daily.    Yes Historical Provider, MD  neomycin-bacitracin-polymyxin (NEOSPORIN) OINT Apply 1 application topically daily as needed for wound care (foot ulcers).   Yes Historical Provider, MD  simvastatin (ZOCOR) 20 MG tablet Take 20 mg by mouth daily.   Yes Historical Provider, MD  silver sulfADIAZINE (SILVADENE) 1 % cream Apply 1 application topically daily. Patient not taking: Reported on 01/23/2016 12/09/14   Alvan Dame, DPM     Family History  Problem Relation Age of Onset  . Heart attack Mother   . Cancer Father     Social History   Social History  . Marital Status: Married    Spouse Name: N/A  . Number of Children: N/A  . Years of Education: N/A   Social History Main Topics  . Smoking status: Never Smoker   . Smokeless tobacco: Never Used  . Alcohol Use: No  . Drug Use: No  . Sexual Activity: Not Asked   Other Topics Concern  . None   Social History Narrative     Review of Systems: A 12 point ROS discussed and pertinent positives are indicated in the HPI above.  All other systems are negative.  Review of Systems  Constitutional: Positive for activity change, appetite change and fatigue. Negative  for fever.  Respiratory: Positive for shortness of breath. Negative for cough.   Cardiovascular: Positive for chest pain.  Neurological: Positive for weakness.  Psychiatric/Behavioral: Positive for confusion. Negative for behavioral problems.    Vital Signs: BP 177/81 mmHg  Pulse 82  Temp(Src) 97.4 F (36.3 C) (Oral)  Resp 23  Ht 5\' 6"  (1.676 m)  Wt 170 lb 3.1 oz (77.2 kg)  BMI 27.48 kg/m2  SpO2 99%  Physical Exam  Cardiovascular: Normal rate and regular rhythm.   Pulmonary/Chest: Effort normal. She has  wheezes.  Abdominal: Soft. Bowel sounds are normal.  Musculoskeletal: Normal range of motion.  Neurological: She is alert.  Skin: Skin is warm and dry.  Psychiatric:  Minimally confused Consented with daughter via phone  Nursing note and vitals reviewed.   Mallampati Score:  MD Evaluation Airway: WNL Heart: WNL Abdomen: WNL Chest/ Lungs: WNL ASA  Classification: 3 Mallampati/Airway Score: Two  Imaging: Dg Chest 1 View  01/23/2016  CLINICAL DATA:  Post right thoracentesis EXAM: CHEST 1 VIEW COMPARISON:  3/20/ 17 FINDINGS: Cardiomediastinal silhouette is stable. There is small residual partial loculated right pleural effusion. No pneumothorax. Persistent bilateral basilar atelectasis. IMPRESSION: Small residual partial loculated right pleural effusion. No pneumothorax. Bilateral basilar atelectasis. No pulmonary edema. Electronically Signed   By: 10-31-1976 M.D.   On: 01/23/2016 13:49   Ct Angio Chest Pe W/cm &/or Wo Cm  01/24/2016  CLINICAL DATA:  Shortness of breath for few days EXAM: CT ANGIOGRAPHY CHEST WITH CONTRAST TECHNIQUE: Multidetector CT imaging of the chest was performed using the standard protocol during bolus administration of intravenous contrast. Multiplanar CT image reconstructions and MIPs were obtained to evaluate the vascular anatomy. CONTRAST:  40mL OMNIPAQUE IOHEXOL 350 MG/ML SOLN COMPARISON:  Plain film from the previous day FINDINGS: The left lung is well aerated bilaterally. Some very minimal dependent atelectatic changes are seen. No sizable effusion is noted. On the right there is a large pleural effusion identified with associated lower lobe consolidation. No definitive mass lesion is seen. The thoracic inlet shows no acute abnormality. The thoracic aorta shows calcification. The pulmonary artery is well visualized and demonstrates a normal branching pattern. Pulmonary artery is predominately within normal limits although in the anterior aspect of  the right lower lobe a filling defect is noted consistent with acute pulmonary embolism. The more distal branches are not well appreciated. No evidence of right heart strain is noted. No hilar or mediastinal adenopathy is noted. Heavy coronary calcifications are seen. The visualized upper abdomen shows fatty infiltration of the liver. The bony structures show degenerative change of the thoracic spine. A healing fracture of the right sixth rib laterally is noted. No other definitive rib fractures are seen. Review of the MIP images confirms the above findings. IMPRESSION: Right lower lobe pulmonary embolism. Additionally there is right lower lobe consolidation and associated large effusion. No evidence of right heart strain is noted. Mild left basilar atelectasis. Healing right sixth rib fracture. Critical Value/emergent results were called by telephone at the time of interpretation on 01/24/2016 at 1:46 pm to Dr. 01/26/2016 , who verbally acknowledged these results. Electronically Signed   By: Richarda Overlie M.D.   On: 01/24/2016 13:54   Dg Chest Port 1 View  01/22/2016  CLINICAL DATA:  Respiratory failure and chest pain EXAM: PORTABLE CHEST 1 VIEW COMPARISON:  None. FINDINGS: Cardiac shadow is not enlarged. Right-sided pleural effusion is noted. There is likely underlying right basilar infiltrate. Some minimal  atelectasis is noted in the left base. No acute bony abnormality is seen. IMPRESSION: Right pleural effusion and likely underlying infiltrate. Left basilar atelectasis is noted. Electronically Signed   By: Alcide Clever M.D.   On: 01/22/2016 21:53    Labs:  CBC:  Recent Labs  01/22/16 2203 01/23/16 0605 01/24/16 0831 01/25/16 0440  WBC 9.8 9.1 7.8 7.5  HGB 9.6* 9.4* 10.3* 9.1*  HCT 31.2* 30.6* 32.3* 29.5*  PLT 336 354 379 366    COAGS:  Recent Labs  01/23/16 0126  INR 1.41  APTT 74*    BMP:  Recent Labs  01/22/16 2203 01/23/16 0605 01/24/16 0831 01/25/16 0440  NA 138 138  139 138  K 4.2 3.4* 3.5 3.4*  CL 106 105 103 106  CO2 20* 23 23 22   GLUCOSE 181* 96 89 96  BUN 17 15 13 8   CALCIUM 7.7* 7.8* 8.3* 7.9*  CREATININE 1.14* 1.17* 0.99 0.93  GFRNONAA 45* 44* 54* 58*  GFRAA 52* 51* >60 >60    LIVER FUNCTION TESTS:  Recent Labs  01/22/16 2203 01/24/16 0831  BILITOT 0.2* 0.5  AST 19 16  ALT 11* 10*  ALKPHOS 77 76  PROT 5.6* 5.8*  ALBUMIN 1.9* 1.8*    TUMOR MARKERS: No results for input(s): AFPTM, CEA, CA199, CHROMGRNA in the last 8760 hours.  Assessment and Plan:  Right PE- no heart strain No DVT per doppler On Heparin drip Loculated Rt pleural effusion----for VATS asap Must come off Heparin for VATS; and stay off for several days to weeks Now scheduled for retrievable inferior vena cava filter placement Risks and Benefits discussed with the patient and daughter including, but not limited to bleeding, infection, contrast induced renal failure, filter fracture or migration which can lead to emergency surgery or even death, strut penetration with damage or irritation to adjacent structures and caval thrombosis. All of the patient's questions were answered, patient and daughter are agreeable to proceed. Consent signed and in chart.   Thank you for this interesting consult.  I greatly enjoyed meeting Kirsten Gallegos and look forward to participating in their care.  A copy of this report was sent to the requesting provider on this date.  Electronically Signed: Ralene Muskrat A 01/25/2016, 9:20 AM   I spent a total of 40 Minutes    in face to face in clinical consultation, greater than 50% of which was counseling/coordinating care for retrievable inferior vena cava filter placement

## 2016-01-25 NOTE — Progress Notes (Signed)
Subjective: Pt still with R sided CP  Breathing is OK   Objective: Filed Vitals:   01/24/16 2000 01/25/16 0000 01/25/16 0400 01/25/16 0439  BP:  140/67 159/76   Pulse: 72 82 73   Temp:  98 F (36.7 C) 97.7 F (36.5 C)   TempSrc:  Oral Oral   Resp: 26 25 22    Height:      Weight:    170 lb 3.1 oz (77.2 kg)  SpO2: 100% 98% 100%    Weight change: 7.1 oz (0.2 kg)  Intake/Output Summary (Last 24 hours) at 01/25/16 0817 Last data filed at 01/25/16 0600  Gross per 24 hour  Intake 2379.5 ml  Output   1000 ml  Net 1379.5 ml    General: Alert, awake, oriented x3, in no acute distress Neck:  JVP is normal Heart: Regular rate and rhythm, without murmurs, rubs, gallops.  Lungs:Decreased BS at R base   Exemities:  No edema.   Neuro: Grossly intact, nonfocal.    Lab Results: Results for orders placed or performed during the hospital encounter of 01/22/16 (from the past 24 hour(s))  Procalcitonin     Status: None   Collection Time: 01/24/16  8:31 AM  Result Value Ref Range   Procalcitonin 0.13 ng/mL  CBC     Status: Abnormal   Collection Time: 01/24/16  8:31 AM  Result Value Ref Range   WBC 7.8 4.0 - 10.5 K/uL   RBC 3.75 (L) 3.87 - 5.11 MIL/uL   Hemoglobin 10.3 (L) 12.0 - 15.0 g/dL   HCT 01/26/16 (L) 65.4 - 65.0 %   MCV 86.1 78.0 - 100.0 fL   MCH 27.5 26.0 - 34.0 pg   MCHC 31.9 30.0 - 36.0 g/dL   RDW 35.4 65.6 - 81.2 %   Platelets 379 150 - 400 K/uL  Comprehensive metabolic panel     Status: Abnormal   Collection Time: 01/24/16  8:31 AM  Result Value Ref Range   Sodium 139 135 - 145 mmol/L   Potassium 3.5 3.5 - 5.1 mmol/L   Chloride 103 101 - 111 mmol/L   CO2 23 22 - 32 mmol/L   Glucose, Bld 89 65 - 99 mg/dL   BUN 13 6 - 20 mg/dL   Creatinine, Ser 01/26/16 0.44 - 1.00 mg/dL   Calcium 8.3 (L) 8.9 - 10.3 mg/dL   Total Protein 5.8 (L) 6.5 - 8.1 g/dL   Albumin 1.8 (L) 3.5 - 5.0 g/dL   AST 16 15 - 41 U/L   ALT 10 (L) 14 - 54 U/L   Alkaline Phosphatase 76 38 - 126 U/L   Total  Bilirubin 0.5 0.3 - 1.2 mg/dL   GFR calc non Af Amer 54 (L) >60 mL/min   GFR calc Af Amer >60 >60 mL/min   Anion gap 13 5 - 15  Heparin level (unfractionated)     Status: None   Collection Time: 01/24/16  8:31 AM  Result Value Ref Range   Heparin Unfractionated 0.42 0.30 - 0.70 IU/mL  Glucose, capillary     Status: Abnormal   Collection Time: 01/24/16 11:55 AM  Result Value Ref Range   Glucose-Capillary 156 (H) 65 - 99 mg/dL  Glucose, capillary     Status: Abnormal   Collection Time: 01/24/16  4:45 PM  Result Value Ref Range   Glucose-Capillary 167 (H) 65 - 99 mg/dL   Comment 1 Venous Specimen   Glucose, capillary     Status: Abnormal  Collection Time: 01/24/16  9:39 PM  Result Value Ref Range   Glucose-Capillary 157 (H) 65 - 99 mg/dL  Heparin level (unfractionated)     Status: None   Collection Time: 01/25/16  4:40 AM  Result Value Ref Range   Heparin Unfractionated 0.46 0.30 - 0.70 IU/mL  Basic metabolic panel     Status: Abnormal   Collection Time: 01/25/16  4:40 AM  Result Value Ref Range   Sodium 138 135 - 145 mmol/L   Potassium 3.4 (L) 3.5 - 5.1 mmol/L   Chloride 106 101 - 111 mmol/L   CO2 22 22 - 32 mmol/L   Glucose, Bld 96 65 - 99 mg/dL   BUN 8 6 - 20 mg/dL   Creatinine, Ser 1.94 0.44 - 1.00 mg/dL   Calcium 7.9 (L) 8.9 - 10.3 mg/dL   GFR calc non Af Amer 58 (L) >60 mL/min   GFR calc Af Amer >60 >60 mL/min   Anion gap 10 5 - 15  CBC     Status: Abnormal   Collection Time: 01/25/16  4:40 AM  Result Value Ref Range   WBC 7.5 4.0 - 10.5 K/uL   RBC 3.42 (L) 3.87 - 5.11 MIL/uL   Hemoglobin 9.1 (L) 12.0 - 15.0 g/dL   HCT 17.4 (L) 08.1 - 44.8 %   MCV 86.3 78.0 - 100.0 fL   MCH 26.6 26.0 - 34.0 pg   MCHC 30.8 30.0 - 36.0 g/dL   RDW 18.5 63.1 - 49.7 %   Platelets 366 150 - 400 K/uL    Studies/Results: Ct Angio Chest Pe W/cm &/or Wo Cm  01/24/2016  CLINICAL DATA:  Shortness of breath for few days EXAM: CT ANGIOGRAPHY CHEST WITH CONTRAST TECHNIQUE: Multidetector CT  imaging of the chest was performed using the standard protocol during bolus administration of intravenous contrast. Multiplanar CT image reconstructions and MIPs were obtained to evaluate the vascular anatomy. CONTRAST:  28mL OMNIPAQUE IOHEXOL 350 MG/ML SOLN COMPARISON:  Plain film from the previous day FINDINGS: The left lung is well aerated bilaterally. Some very minimal dependent atelectatic changes are seen. No sizable effusion is noted. On the right there is a large pleural effusion identified with associated lower lobe consolidation. No definitive mass lesion is seen. The thoracic inlet shows no acute abnormality. The thoracic aorta shows calcification. The pulmonary artery is well visualized and demonstrates a normal branching pattern. Pulmonary artery is predominately within normal limits although in the anterior aspect of the right lower lobe a filling defect is noted consistent with acute pulmonary embolism. The more distal branches are not well appreciated. No evidence of right heart strain is noted. No hilar or mediastinal adenopathy is noted. Heavy coronary calcifications are seen. The visualized upper abdomen shows fatty infiltration of the liver. The bony structures show degenerative change of the thoracic spine. A healing fracture of the right sixth rib laterally is noted. No other definitive rib fractures are seen. Review of the MIP images confirms the above findings. IMPRESSION: Right lower lobe pulmonary embolism. Additionally there is right lower lobe consolidation and associated large effusion. No evidence of right heart strain is noted. Mild left basilar atelectasis. Healing right sixth rib fracture. Critical Value/emergent results were called by telephone at the time of interpretation on 01/24/2016 at 1:46 pm to Dr. Richarda Overlie , who verbally acknowledged these results. Electronically Signed   By: Alcide Clever M.D.   On: 01/24/2016 13:54      Medications: REviewed   @PROBHOSP @  1   Pericardial effusion  Echo shows LVEF is good  (vigorous)  On my review there is a smal pericardial effusion      2.  HTN  BP is elevated  Was on metoprolol prior to admit  WOuld resume  Follow HR and BP    LOS: 3 days   [name] 01/25/2016, 8:17 AM

## 2016-01-25 NOTE — Progress Notes (Signed)
  Echocardiogram 2D Echocardiogram has been performed.  Janalyn Harder 01/25/2016, 2:20 PM

## 2016-01-25 NOTE — Procedures (Signed)
Placement of retrievable IVC filter without complication.  See full report in Imaging/PACS.  No immediate complication.  Minimal blood loss.

## 2016-01-25 NOTE — Progress Notes (Signed)
  Subjective:  Right chest pain and shortness of breath with activity. Had IVC filter placed today. Still on heparin  Objective: Vital signs in last 24 hours: Temp:  [97.4 F (36.3 C)-98.4 F (36.9 C)] 98.2 F (36.8 C) (03/23 1215) Pulse Rate:  [69-83] 71 (03/23 1215) Cardiac Rhythm:  [-] Normal sinus rhythm (03/23 1155) Resp:  [17-26] 18 (03/23 1215) BP: (140-177)/(67-81) 167/77 mmHg (03/23 1215) SpO2:  [98 %-100 %] 99 % (03/23 1215) Weight:  [77.2 kg (170 lb 3.1 oz)] 77.2 kg (170 lb 3.1 oz) (03/23 0439)  Hemodynamic parameters for last 24 hours:    Intake/Output from previous day: 03/22 0701 - 03/23 0700 In: 2469 [P.O.:400; I.V.:1969; IV Piggyback:100] Out: 1000 [Urine:1000] Intake/Output this shift:    General appearance: alert and cooperative Heart: regular rate and rhythm, S1, S2 normal, no murmur, click, rub or gallop Lungs: diminished breath sounds RLL  Lab Results:  Recent Labs  01/24/16 0831 01/25/16 0440  WBC 7.8 7.5  HGB 10.3* 9.1*  HCT 32.3* 29.5*  PLT 379 366   BMET:  Recent Labs  01/24/16 0831 01/25/16 0440  NA 139 138  K 3.5 3.4*  CL 103 106  CO2 23 22  GLUCOSE 89 96  BUN 13 8  CREATININE 0.99 0.93  CALCIUM 8.3* 7.9*    PT/INR:  Recent Labs  01/23/16 0126  LABPROT 17.3*  INR 1.41   ABG No results found for: PHART, HCO3, TCO2, ACIDBASEDEF, O2SAT CBG (last 3)   Recent Labs  01/25/16 0840 01/25/16 1227 01/25/16 1743  GLUCAP 92 100* 139*    Assessment/Plan: Large loculated right pleural effusion with collapse and consolidation of right lung. IVC filter placed today so will plan to proceed with right VATS or thoracotomy tomorrow afternoon for drainage of the effusion. I discussed the procedure and benefits/risks with the patient and with her daughter Ardeth Perfect by phone who is POA and they understand and agree to proceed.   LOS: 3 days    Alleen Borne 01/25/2016

## 2016-01-26 ENCOUNTER — Inpatient Hospital Stay (HOSPITAL_COMMUNITY): Payer: Medicare Other | Admitting: Certified Registered Nurse Anesthetist

## 2016-01-26 ENCOUNTER — Encounter (HOSPITAL_COMMUNITY)
Admission: AD | Disposition: A | Discharge status: Home Health | Payer: Self-pay | Source: Other Acute Inpatient Hospital | Attending: Internal Medicine

## 2016-01-26 ENCOUNTER — Inpatient Hospital Stay (HOSPITAL_COMMUNITY): Payer: Medicare Other

## 2016-01-26 DIAGNOSIS — J90 Pleural effusion, not elsewhere classified: Secondary | ICD-10-CM

## 2016-01-26 HISTORY — PX: VIDEO ASSISTED THORACOSCOPY (VATS)/THOROCOTOMY: SHX6173

## 2016-01-26 LAB — CBC
HEMATOCRIT: 29.8 % — AB (ref 36.0–46.0)
HEMOGLOBIN: 9.1 g/dL — AB (ref 12.0–15.0)
MCH: 26.6 pg (ref 26.0–34.0)
MCHC: 30.5 g/dL (ref 30.0–36.0)
MCV: 87.1 fL (ref 78.0–100.0)
Platelets: 367 10*3/uL (ref 150–400)
RBC: 3.42 MIL/uL — ABNORMAL LOW (ref 3.87–5.11)
RDW: 15.4 % (ref 11.5–15.5)
WBC: 8.6 10*3/uL (ref 4.0–10.5)

## 2016-01-26 LAB — GLUCOSE, CAPILLARY
GLUCOSE-CAPILLARY: 153 mg/dL — AB (ref 65–99)
Glucose-Capillary: 158 mg/dL — ABNORMAL HIGH (ref 65–99)
Glucose-Capillary: 173 mg/dL — ABNORMAL HIGH (ref 65–99)

## 2016-01-26 LAB — GRAM STAIN

## 2016-01-26 LAB — COMPREHENSIVE METABOLIC PANEL
ALBUMIN: 1.8 g/dL — AB (ref 3.5–5.0)
ALK PHOS: 66 U/L (ref 38–126)
ALT: 9 U/L — ABNORMAL LOW (ref 14–54)
ANION GAP: 10 (ref 5–15)
AST: 16 U/L (ref 15–41)
BILIRUBIN TOTAL: 0.4 mg/dL (ref 0.3–1.2)
BUN: 8 mg/dL (ref 6–20)
CALCIUM: 8.2 mg/dL — AB (ref 8.9–10.3)
CO2: 23 mmol/L (ref 22–32)
Chloride: 107 mmol/L (ref 101–111)
Creatinine, Ser: 0.96 mg/dL (ref 0.44–1.00)
GFR calc non Af Amer: 56 mL/min — ABNORMAL LOW (ref >60)
GLUCOSE: 132 mg/dL — AB (ref 65–99)
POTASSIUM: 3.9 mmol/L (ref 3.5–5.1)
SODIUM: 140 mmol/L (ref 135–145)
TOTAL PROTEIN: 5.7 g/dL — AB (ref 6.5–8.1)

## 2016-01-26 LAB — PROCALCITONIN

## 2016-01-26 LAB — HEPARIN LEVEL (UNFRACTIONATED): HEPARIN UNFRACTIONATED: 0.48 [IU]/mL (ref 0.30–0.70)

## 2016-01-26 LAB — PREPARE RBC (CROSSMATCH)

## 2016-01-26 SURGERY — VIDEO ASSISTED THORACOSCOPY (VATS)/THOROCOTOMY
Anesthesia: General | Site: Chest | Laterality: Right

## 2016-01-26 MED ORDER — OXYCODONE HCL 5 MG PO TABS
5.0000 mg | ORAL_TABLET | ORAL | Status: DC | PRN
  Administered 2016-01-26 – 2016-01-27 (×4): 10 mg via ORAL
  Filled 2016-01-26 (×4): qty 2

## 2016-01-26 MED ORDER — ONDANSETRON HCL 4 MG/2ML IJ SOLN
4.0000 mg | Freq: Four times a day (QID) | INTRAMUSCULAR | Status: DC | PRN
  Administered 2016-01-27 – 2016-01-30 (×6): 4 mg via INTRAVENOUS
  Filled 2016-01-26 (×6): qty 2

## 2016-01-26 MED ORDER — STERILE WATER FOR INJECTION IJ SOLN
INTRAMUSCULAR | Status: AC
  Filled 2016-01-26: qty 10

## 2016-01-26 MED ORDER — EPHEDRINE SULFATE 50 MG/ML IJ SOLN
INTRAMUSCULAR | Status: AC
  Filled 2016-01-26: qty 1

## 2016-01-26 MED ORDER — SUGAMMADEX SODIUM 200 MG/2ML IV SOLN
INTRAVENOUS | Status: AC
  Filled 2016-01-26: qty 2

## 2016-01-26 MED ORDER — SUGAMMADEX SODIUM 200 MG/2ML IV SOLN
INTRAVENOUS | Status: DC | PRN
  Administered 2016-01-26: 200 mg via INTRAVENOUS

## 2016-01-26 MED ORDER — LABETALOL HCL 5 MG/ML IV SOLN
INTRAVENOUS | Status: AC
  Filled 2016-01-26: qty 4

## 2016-01-26 MED ORDER — ARTIFICIAL TEARS OP OINT
TOPICAL_OINTMENT | OPHTHALMIC | Status: AC
  Filled 2016-01-26: qty 3.5

## 2016-01-26 MED ORDER — FENTANYL CITRATE (PF) 100 MCG/2ML IJ SOLN
INTRAMUSCULAR | Status: AC
  Filled 2016-01-26: qty 2

## 2016-01-26 MED ORDER — ONDANSETRON HCL 4 MG/2ML IJ SOLN
INTRAMUSCULAR | Status: AC
Start: 2016-01-26 — End: 2016-01-26
  Filled 2016-01-26: qty 2

## 2016-01-26 MED ORDER — POTASSIUM CHLORIDE 10 MEQ/50ML IV SOLN
10.0000 meq | Freq: Every day | INTRAVENOUS | Status: DC | PRN
  Filled 2016-01-26: qty 50

## 2016-01-26 MED ORDER — GUAIFENESIN ER 600 MG PO TB12
600.0000 mg | ORAL_TABLET | Freq: Two times a day (BID) | ORAL | Status: DC
  Administered 2016-01-26 – 2016-02-02 (×14): 600 mg via ORAL
  Filled 2016-01-26 (×14): qty 1

## 2016-01-26 MED ORDER — NEOSTIGMINE METHYLSULFATE 10 MG/10ML IV SOLN
INTRAVENOUS | Status: AC
  Filled 2016-01-26: qty 5

## 2016-01-26 MED ORDER — LABETALOL HCL 5 MG/ML IV SOLN
INTRAVENOUS | Status: DC | PRN
  Administered 2016-01-26 (×2): 5 mg via INTRAVENOUS

## 2016-01-26 MED ORDER — MIDAZOLAM HCL 2 MG/2ML IJ SOLN
INTRAMUSCULAR | Status: AC
  Filled 2016-01-26: qty 2

## 2016-01-26 MED ORDER — FENTANYL CITRATE (PF) 100 MCG/2ML IJ SOLN
INTRAMUSCULAR | Status: DC | PRN
  Administered 2016-01-26: 100 ug via INTRAVENOUS
  Administered 2016-01-26: 50 ug via INTRAVENOUS
  Administered 2016-01-26: 100 ug via INTRAVENOUS

## 2016-01-26 MED ORDER — VECURONIUM BROMIDE 10 MG IV SOLR
INTRAVENOUS | Status: AC
  Filled 2016-01-26: qty 10

## 2016-01-26 MED ORDER — FENTANYL CITRATE (PF) 250 MCG/5ML IJ SOLN
INTRAMUSCULAR | Status: AC
  Filled 2016-01-26: qty 5

## 2016-01-26 MED ORDER — ACETAMINOPHEN 10 MG/ML IV SOLN
INTRAVENOUS | Status: AC
  Filled 2016-01-26: qty 100

## 2016-01-26 MED ORDER — 0.9 % SODIUM CHLORIDE (POUR BTL) OPTIME
TOPICAL | Status: DC | PRN
  Administered 2016-01-26: 2000 mL

## 2016-01-26 MED ORDER — PHENYLEPHRINE HCL 10 MG/ML IJ SOLN
10.0000 mg | INTRAVENOUS | Status: DC | PRN
  Administered 2016-01-26: 25 ug/min via INTRAVENOUS

## 2016-01-26 MED ORDER — LACTATED RINGERS IV SOLN: INTRAVENOUS | Status: DC

## 2016-01-26 MED ORDER — INSULIN ASPART 100 UNIT/ML ~~LOC~~ SOLN
0.0000 [IU] | SUBCUTANEOUS | Status: DC
  Administered 2016-01-26: 4 [IU] via SUBCUTANEOUS
  Administered 2016-01-26: 2 [IU] via SUBCUTANEOUS
  Administered 2016-01-27: 4 [IU] via SUBCUTANEOUS
  Administered 2016-01-27: 2 [IU] via SUBCUTANEOUS
  Administered 2016-01-27: 4 [IU] via SUBCUTANEOUS
  Administered 2016-01-28: 8 [IU] via SUBCUTANEOUS
  Administered 2016-01-28 – 2016-01-29 (×4): 2 [IU] via SUBCUTANEOUS
  Administered 2016-01-29: 8 [IU] via SUBCUTANEOUS

## 2016-01-26 MED ORDER — ACETAMINOPHEN 500 MG PO TABS
1000.0000 mg | ORAL_TABLET | Freq: Four times a day (QID) | ORAL | Status: DC
  Administered 2016-01-27 – 2016-01-28 (×5): 1000 mg via ORAL
  Filled 2016-01-26 (×5): qty 2

## 2016-01-26 MED ORDER — PROPOFOL 10 MG/ML IV BOLUS
INTRAVENOUS | Status: AC
  Filled 2016-01-26: qty 20

## 2016-01-26 MED ORDER — ONDANSETRON HCL 4 MG/2ML IJ SOLN: 4.0000 mg | Freq: Once | INTRAMUSCULAR | Status: DC | PRN

## 2016-01-26 MED ORDER — SENNOSIDES-DOCUSATE SODIUM 8.6-50 MG PO TABS
1.0000 | ORAL_TABLET | Freq: Every day | ORAL | Status: DC
  Administered 2016-01-26 – 2016-01-29 (×3): 1 via ORAL
  Filled 2016-01-26 (×3): qty 1

## 2016-01-26 MED ORDER — KETOROLAC TROMETHAMINE 15 MG/ML IJ SOLN
INTRAMUSCULAR | Status: AC
  Filled 2016-01-26: qty 1

## 2016-01-26 MED ORDER — BISACODYL 5 MG PO TBEC
10.0000 mg | DELAYED_RELEASE_TABLET | Freq: Every day | ORAL | Status: DC
  Administered 2016-01-27 – 2016-02-02 (×5): 10 mg via ORAL
  Filled 2016-01-26 (×5): qty 2

## 2016-01-26 MED ORDER — LEVALBUTEROL HCL 0.63 MG/3ML IN NEBU: 0.6300 mg | INHALATION_SOLUTION | Freq: Four times a day (QID) | RESPIRATORY_TRACT | Status: DC | PRN

## 2016-01-26 MED ORDER — KCL IN DEXTROSE-NACL 20-5-0.45 MEQ/L-%-% IV SOLN
INTRAVENOUS | Status: DC
  Administered 2016-01-26 – 2016-01-30 (×5): via INTRAVENOUS
  Filled 2016-01-26 (×8): qty 1000

## 2016-01-26 MED ORDER — FENTANYL CITRATE (PF) 100 MCG/2ML IJ SOLN: 25.0000 ug | INTRAMUSCULAR | Status: DC | PRN

## 2016-01-26 MED ORDER — KETOROLAC TROMETHAMINE 15 MG/ML IJ SOLN
15.0000 mg | Freq: Four times a day (QID) | INTRAMUSCULAR | Status: DC | PRN
  Administered 2016-01-26 – 2016-01-29 (×4): 15 mg via INTRAVENOUS
  Filled 2016-01-26 (×6): qty 1

## 2016-01-26 MED ORDER — LIDOCAINE HCL (CARDIAC) 20 MG/ML IV SOLN
INTRAVENOUS | Status: DC | PRN
  Administered 2016-01-26: 50 mg via INTRAVENOUS

## 2016-01-26 MED ORDER — SODIUM CHLORIDE 0.9 % IJ SOLN
INTRAMUSCULAR | Status: AC
  Filled 2016-01-26: qty 10

## 2016-01-26 MED ORDER — ROCURONIUM BROMIDE 50 MG/5ML IV SOLN
INTRAVENOUS | Status: AC
  Filled 2016-01-26: qty 1

## 2016-01-26 MED ORDER — FENTANYL CITRATE (PF) 100 MCG/2ML IJ SOLN
25.0000 ug | INTRAMUSCULAR | Status: DC | PRN
  Administered 2016-01-26 (×3): 50 ug via INTRAVENOUS

## 2016-01-26 MED ORDER — LEVALBUTEROL HCL 0.63 MG/3ML IN NEBU
0.6300 mg | INHALATION_SOLUTION | Freq: Four times a day (QID) | RESPIRATORY_TRACT | Status: DC
  Administered 2016-01-26: 0.63 mg via RESPIRATORY_TRACT
  Filled 2016-01-26: qty 3

## 2016-01-26 MED ORDER — ACETAMINOPHEN 160 MG/5ML PO SOLN: 1000.0000 mg | Freq: Four times a day (QID) | ORAL | Status: DC

## 2016-01-26 MED ORDER — ONDANSETRON HCL 4 MG/2ML IJ SOLN
INTRAMUSCULAR | Status: DC | PRN
  Administered 2016-01-26: 4 mg via INTRAVENOUS

## 2016-01-26 MED ORDER — ACETAMINOPHEN 10 MG/ML IV SOLN
1000.0000 mg | Freq: Once | INTRAVENOUS | Status: AC
  Administered 2016-01-26: 1000 mg via INTRAVENOUS

## 2016-01-26 MED ORDER — ROCURONIUM BROMIDE 100 MG/10ML IV SOLN
INTRAVENOUS | Status: DC | PRN
  Administered 2016-01-26: 40 mg via INTRAVENOUS

## 2016-01-26 MED ORDER — PROPOFOL 10 MG/ML IV BOLUS
INTRAVENOUS | Status: DC | PRN
  Administered 2016-01-26: 120 mg via INTRAVENOUS

## 2016-01-26 MED ORDER — LIDOCAINE HCL (CARDIAC) 20 MG/ML IV SOLN
INTRAVENOUS | Status: AC
  Filled 2016-01-26: qty 5

## 2016-01-26 MED ORDER — SUCCINYLCHOLINE CHLORIDE 20 MG/ML IJ SOLN
INTRAMUSCULAR | Status: AC
  Filled 2016-01-26: qty 1

## 2016-01-26 SURGICAL SUPPLY — 59 items
APPLICATOR TIP EXT COSEAL (VASCULAR PRODUCTS) IMPLANT
CANISTER SUCTION 2500CC (MISCELLANEOUS) ×3 IMPLANT
CATH KIT ON Q 5IN SLV (PAIN MANAGEMENT) IMPLANT
CATH THORACIC 28FR (CATHETERS) IMPLANT
CATH THORACIC 36FR (CATHETERS) ×3 IMPLANT
CATH THORACIC 36FR RT ANG (CATHETERS) IMPLANT
CLIP TI MEDIUM 6 (CLIP) IMPLANT
CONT SPEC 4OZ CLIKSEAL STRL BL (MISCELLANEOUS) ×6 IMPLANT
COVER SURGICAL LIGHT HANDLE (MISCELLANEOUS) IMPLANT
DERMABOND ADVANCED (GAUZE/BANDAGES/DRESSINGS) ×2
DERMABOND ADVANCED .7 DNX12 (GAUZE/BANDAGES/DRESSINGS) ×1 IMPLANT
DRAPE LAPAROSCOPIC ABDOMINAL (DRAPES) ×3 IMPLANT
DRAPE SLUSH MACHINE 52X66 (DRAPES) ×3 IMPLANT
DRAPE WARM FLUID 44X44 (DRAPE) IMPLANT
ELECT REM PT RETURN 9FT ADLT (ELECTROSURGICAL) ×3
ELECTRODE REM PT RTRN 9FT ADLT (ELECTROSURGICAL) ×1 IMPLANT
GAUZE SPONGE 4X4 12PLY STRL (GAUZE/BANDAGES/DRESSINGS) ×3 IMPLANT
GLOVE EUDERMIC 7 POWDERFREE (GLOVE) ×6 IMPLANT
GOWN STRL REUS W/ TWL LRG LVL3 (GOWN DISPOSABLE) ×2 IMPLANT
GOWN STRL REUS W/ TWL XL LVL3 (GOWN DISPOSABLE) ×1 IMPLANT
GOWN STRL REUS W/TWL LRG LVL3 (GOWN DISPOSABLE) ×4
GOWN STRL REUS W/TWL XL LVL3 (GOWN DISPOSABLE) ×2
HEMOSTAT SURGICEL 2X14 (HEMOSTASIS) IMPLANT
KIT BASIN OR (CUSTOM PROCEDURE TRAY) ×3 IMPLANT
KIT ROOM TURNOVER OR (KITS) ×3 IMPLANT
NS IRRIG 1000ML POUR BTL (IV SOLUTION) ×6 IMPLANT
PACK CHEST (CUSTOM PROCEDURE TRAY) ×3 IMPLANT
PAD ARMBOARD 7.5X6 YLW CONV (MISCELLANEOUS) ×12 IMPLANT
SEALANT SURG COSEAL 4ML (VASCULAR PRODUCTS) IMPLANT
SEALANT SURG COSEAL 8ML (VASCULAR PRODUCTS) IMPLANT
SOLUTION ANTI FOG 6CC (MISCELLANEOUS) ×3 IMPLANT
SPONGE GAUZE 4X4 12PLY STER LF (GAUZE/BANDAGES/DRESSINGS) ×3 IMPLANT
SUT PROLENE 3 0 SH DA (SUTURE) IMPLANT
SUT PROLENE 4 0 RB 1 (SUTURE)
SUT PROLENE 4-0 RB1 .5 CRCL 36 (SUTURE) IMPLANT
SUT SILK  1 MH (SUTURE) ×6
SUT SILK 1 MH (SUTURE) ×3 IMPLANT
SUT SILK 1 TIES 10X30 (SUTURE) ×3 IMPLANT
SUT SILK 2 0 SH (SUTURE) IMPLANT
SUT SILK 2 0SH CR/8 30 (SUTURE) IMPLANT
SUT VIC AB 1 CTX 18 (SUTURE) IMPLANT
SUT VIC AB 1 CTX 36 (SUTURE) ×2
SUT VIC AB 1 CTX36XBRD ANBCTR (SUTURE) ×1 IMPLANT
SUT VIC AB 2-0 CTX 36 (SUTURE) ×3 IMPLANT
SUT VIC AB 2-0 UR6 27 (SUTURE) ×3 IMPLANT
SUT VIC AB 3-0 MH 27 (SUTURE) IMPLANT
SUT VIC AB 3-0 X1 27 (SUTURE) ×3 IMPLANT
SUT VICRYL 2 TP 1 (SUTURE) IMPLANT
SWAB COLLECTION DEVICE MRSA (MISCELLANEOUS) IMPLANT
SYSTEM SAHARA CHEST DRAIN RE-I (WOUND CARE) ×3 IMPLANT
TAPE CLOTH 4X10 WHT NS (GAUZE/BANDAGES/DRESSINGS) ×3 IMPLANT
TAPE CLOTH SURG 6X10 WHT LF (GAUZE/BANDAGES/DRESSINGS) ×3 IMPLANT
TIP APPLICATOR SPRAY EXTEND 16 (VASCULAR PRODUCTS) IMPLANT
TOWEL OR 17X24 6PK STRL BLUE (TOWEL DISPOSABLE) ×3 IMPLANT
TOWEL OR 17X26 10 PK STRL BLUE (TOWEL DISPOSABLE) ×3 IMPLANT
TRAP SPECIMEN MUCOUS 40CC (MISCELLANEOUS) ×3 IMPLANT
TRAY FOLEY CATH 14FRSI W/METER (CATHETERS) ×3 IMPLANT
TUBE ANAEROBIC SPECIMEN COL (MISCELLANEOUS) IMPLANT
WATER STERILE IRR 1000ML POUR (IV SOLUTION) ×3 IMPLANT

## 2016-01-26 NOTE — Progress Notes (Signed)
Note plans for surgery today . No new recommendations. WIll be available as needed  Please call with questions.

## 2016-01-26 NOTE — Brief Op Note (Signed)
01/22/2016 - 01/26/2016  3:35 PM  PATIENT:  Kirsten Gallegos  80 y.o. female  PRE-OPERATIVE DIAGNOSIS:  acute pulmonary embolism, large loculated right pleural effusion  POST-OPERATIVE DIAGNOSIS:  same  PROCEDURE:  Procedure(s): VIDEO ASSISTED THORACOSCOPY (VATS) for drainage of loculated right pleural effusion (Right)  SURGEON:  Surgeon(s) and Role:    * Alleen Borne, MD - Primary  PHYSICIAN ASSISTANT: Doree Fudge, PA-C    ANESTHESIA:   general  EBL:  Total I/O In: 700 [I.V.:600; IV Piggyback:100] Out: 550 [Urine:550]  BLOOD ADMINISTERED:none  DRAINS: 36 F  Chest Tube in the right pleural space   LOCAL MEDICATIONS USED:  NONE  SPECIMEN:  Source of Specimen:  right pleural fluid  DISPOSITION OF SPECIMEN:  micro for routine culture, AFB and fungus; pathology for cytology  COUNTS:  YES  TOURNIQUET:  * No tourniquets in log *  DICTATION: .Note written in EPIC  PLAN OF CARE: Admit to inpatient   PATIENT DISPOSITION:  PACU - hemodynamically stable.   Delay start of Pharmacological VTE agent (>24hrs) due to surgical blood loss or risk of bleeding: yes

## 2016-01-26 NOTE — Progress Notes (Signed)
Upper and lower dentures removed and given to son upon arrival to short stay.

## 2016-01-26 NOTE — Progress Notes (Signed)
01/26/2016, 6:25 PM  : Post IR Procedure Pharmacy Anticoagulation/Antiplatelet Review.  - Post-procedure review of Anticoagulation as per post-IR protocol completed. - Per Discussion with Dr. Laneta Simmers, there is to be no anticoagulation, nor antiplatelet medication for minimum of 7 [seven] days. - Care order/Nursing Instruction orders entered per protocol.  Velda Shell,  Pharm.D   01/26/2016,  6:28 PM

## 2016-01-26 NOTE — Progress Notes (Signed)
Called Dr Laneta Simmers regarding poor pain control post op despite administration of of Fentanyl IV. Pt not a candidate for PCA usage per Dr Laneta Simmers. Ordered additional 15mg  IV Ketorolac.   Dr MDA called for additional pain medication while in PACU. Ordered OFIRMEV one dose. Will continue to monitor for better pain control before return back to patient room.

## 2016-01-26 NOTE — Anesthesia Procedure Notes (Signed)
Procedure Name: Intubation Date/Time: 01/26/2016 2:30 PM Performed by: Marena Chancy Pre-anesthesia Checklist: Patient identified, Patient being monitored, Emergency Drugs available, Timeout performed and Suction available Patient Re-evaluated:Patient Re-evaluated prior to inductionOxygen Delivery Method: Circle system utilized Preoxygenation: Pre-oxygenation with 100% oxygen Intubation Type: IV induction Ventilation: Mask ventilation without difficulty and Oral airway inserted - appropriate to patient size Laryngoscope Size: Mac and 3 Grade View: Grade I Endobronchial tube: Left, EBT position confirmed by fiberoptic bronchoscope, EBT position confirmed by auscultation and Double lumen EBT and 35 Fr Number of attempts: 1 Placement Confirmation: ETT inserted through vocal cords under direct vision,  breath sounds checked- equal and bilateral and positive ETCO2 Tube secured with: Tape Dental Injury: Teeth and Oropharynx as per pre-operative assessment
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
## 2016-01-26 NOTE — Progress Notes (Signed)
Heparin stopped at approximately 12:50 pm. Dr. Laneta Simmers aware. Short stay RN aware.  Asher Muir Leelyn Jasinski,RN

## 2016-01-26 NOTE — Op Note (Signed)
CARDIOTHORACIC SURGERY OPERATIVE NOTE:  Kirsten Gallegos [id number] 01/26/2016   Preoperative Dx:  Loculated right pleural effusion  Postoperative Dx: Same   Procedure: Right video-assisted thoracoscopy, drainage of loculated pleural effusion.  Surgeon: Dr. Alleen Borne   Assistant: Doree Fudge, PA-C  Anesthesia: GET   Clinical History:   The patient is a 80 year old woman with a history of rheumatoid arthritis and decreased mobility who was admitted to [facility] in early March for UTI and mild DKA. She says that they did not do anything for her and she was discharged on 3/13//2017. She was reportedly very debilitated but refused home health or rehab and went back to live with her son. On 01/21/2016 she returned to [place] with right sided chest pain and shortness of breath and fever to 103. In the ED she had a CTA that showed a RLL segmental PE as well as a loculated right pleural effusion and multiple rib fractures in various stages of healing. She denied any trauma but had been coughing and sneezing and has been on chroni steroids for RA. She had increasing oxygen requirements and there was concern about her worsening clinical state so she was transferred to [facility] on the evening of 01/22/2016. She had an attempt at US guided thoracentesis yesterday but only a small amount of fluid could be removed. Abundant WBC but no organisms on gram stain and culture negative so far. She had a repeat CT here  that shows a persistent large loculated right pleural effusion and compressive atelectasis or consolidation of the right lower lobe. It was felt that surgical drainage was needed. A vena cava filter was placed by IR so that she could be off heparin. I discussed the procedure and benefits/risks with the patient and with her daughter Ardeth Perfect by phone who is POA and they understand and agree to proceed.   Operative procedure:   The patient was seen in the preoperative holding area.  The proper patient, proper operative side, proper operation were confirmed after reviewing his history and chest x-ray. The right side of the chest was signed by me. Preoperative intravenous antibiotics were given. She was taken back to the operating room and placed on table in the supine position. After induction of general endotracheal anesthesia using a double-lumen tube, a Foley catheter was placed in the bladder using sterile technique. Lower extremity sequential compression devices were used. The patient was positioned in the left lateral decubitus position with the right side up. The right side of the chest was prepped with Betadine soap and solution and draped in the usual sterile manner. A timeout was taken and the proper patient, proper operation, and proper operative side were confirmed with nursing and anesthesia staff. A 1 cm incision was made in the midaxillary line at about the eighth intercostal space. The right pleural space was entered bluntly with a hemostat and an 8 mm trocar was inserted. The 30 thoracoscope was inserted and the pleural space inspected. There was a large loculated pleural effusion with thin sero-sanginous fluid between the loculations. There was a small area in the RLL peripherally where the lung looked discolored with slight exudate and I wonder if this could be an infarct. It was small and was not biopsied. A second 1 cm incision was made in the anterior axillary line at the forth intercostal space for insertion of instruments. The loculation were broken up and the fluid removed. Specimens were sent for routine culture, AFB, and fungus as well as  cytology. Then one 61 French chest tube was placed.The anterior axillary incision was then closed in layers using a 2-0 Vicryl subcutaneous suture and 3-0 Vicryl subcuticular skin closure. The previous chest tube site was about 3 cm long. The muscle was reapproximated with interrupted 2-0 Vicryl sutures and the subcutaneous tissue  and skin were closed as a single layer using interrupted 3-0 nylon sutures. The chest tube was connected to Pleur-evac suction. The sponge needle and instrument counts were correct according to the scrub nurse. The patient was then turned into the supine position, extubated, and transported to the post anesthesia care unit in satisfactory and stable condition.

## 2016-01-26 NOTE — Progress Notes (Signed)
Pt for vats procedure today. phy there rec hhpt. Had ivc filter placed 3-23. Will follow progress and assist w dc planning as pt progresses. Lives w fam.

## 2016-01-26 NOTE — Anesthesia Preprocedure Evaluation (Signed)
Anesthesia Evaluation  Patient identified by MRN, date of birth, ID band Patient awake    Reviewed: Allergy & Precautions, NPO status , Patient's Chart, lab work & pertinent test results  Airway Mallampati: II  TM Distance: >3 FB Neck ROM: Full    Dental  (+) Edentulous Upper, Edentulous Lower   Pulmonary    breath sounds clear to auscultation       Cardiovascular hypertension,  Rhythm:Regular Rate:Normal     Neuro/Psych    GI/Hepatic   Endo/Other    Renal/GU      Musculoskeletal   Abdominal   Peds  Hematology   Anesthesia Other Findings   Reproductive/Obstetrics                             Anesthesia Physical Anesthesia Plan  ASA: III  Anesthesia Plan: General   Post-op Pain Management:    Induction: Intravenous  Airway Management Planned: Oral ETT  Additional Equipment: Arterial line  Intra-op Plan:   Post-operative Plan: Extubation in OR  Informed Consent: I have reviewed the patients History and Physical, chart, labs and discussed the procedure including the risks, benefits and alternatives for the proposed anesthesia with the patient or authorized representative who has indicated his/her understanding and acceptance.     Plan Discussed with: CRNA and Anesthesiologist  Anesthesia Plan Comments:         Anesthesia Quick Evaluation

## 2016-01-26 NOTE — Transfer of Care (Signed)
Immediate Anesthesia Transfer of Care Note  Patient: Kirsten Gallegos  Procedure(s) Performed: Procedure(s): VIDEO ASSISTED THORACOSCOPY (VATS) for drainage of loculated right pleural effusion (Right)  Patient Location: PACU  Anesthesia Type:General  Level of Consciousness: awake, alert  and oriented  Airway & Oxygen Therapy: Patient Spontanous Breathing and Patient connected to face mask oxygen  Post-op Assessment: Report given to RN, Post -op Vital signs reviewed and stable and Patient moving all extremities X 4  Post vital signs: Reviewed and stable  Last Vitals:  Filed Vitals:   01/26/16 1133 01/26/16 1559  BP: 167/89 128/83  Pulse: 66 65  Temp: 36.4 C 36.3 C  Resp: 18 25    Complications: No apparent anesthesia complications

## 2016-01-26 NOTE — Anesthesia Postprocedure Evaluation (Signed)
Anesthesia Post Note  Patient: Kirsten Gallegos  Procedure(s) Performed: Procedure(s) (LRB): VIDEO ASSISTED THORACOSCOPY (VATS) for drainage of loculated right pleural effusion (Right)  Patient location during evaluation: PACU Anesthesia Type: General Level of consciousness: awake, awake and alert and oriented Pain management: pain level controlled Vital Signs Assessment: post-procedure vital signs reviewed and stable Respiratory status: spontaneous breathing, nonlabored ventilation and respiratory function stable Cardiovascular status: blood pressure returned to baseline Anesthetic complications: no    Last Vitals:  Filed Vitals:   01/26/16 1700 01/26/16 1710  BP: 102/50 99/50  Pulse: 57 59  Temp:    Resp: 15 17    Last Pain:  Filed Vitals:   01/26/16 1711  PainSc: Asleep                 Corlette Ciano COKER

## 2016-01-27 ENCOUNTER — Inpatient Hospital Stay (HOSPITAL_COMMUNITY): Payer: Medicare Other

## 2016-01-27 LAB — COMPREHENSIVE METABOLIC PANEL
ALBUMIN: 1.9 g/dL — AB (ref 3.5–5.0)
ALT: 10 U/L — ABNORMAL LOW (ref 14–54)
AST: 20 U/L (ref 15–41)
Alkaline Phosphatase: 61 U/L (ref 38–126)
Anion gap: 9 (ref 5–15)
BILIRUBIN TOTAL: 0.4 mg/dL (ref 0.3–1.2)
BUN: 8 mg/dL (ref 6–20)
CHLORIDE: 110 mmol/L (ref 101–111)
CO2: 22 mmol/L (ref 22–32)
Calcium: 8 mg/dL — ABNORMAL LOW (ref 8.9–10.3)
Creatinine, Ser: 1.26 mg/dL — ABNORMAL HIGH (ref 0.44–1.00)
GFR calc Af Amer: 46 mL/min — ABNORMAL LOW (ref >60)
GFR calc non Af Amer: 40 mL/min — ABNORMAL LOW (ref >60)
GLUCOSE: 128 mg/dL — AB (ref 65–99)
POTASSIUM: 3.6 mmol/L (ref 3.5–5.1)
Sodium: 141 mmol/L (ref 135–145)
Total Protein: 5.5 g/dL — ABNORMAL LOW (ref 6.5–8.1)

## 2016-01-27 LAB — GLUCOSE, CAPILLARY
GLUCOSE-CAPILLARY: 104 mg/dL — AB (ref 65–99)
GLUCOSE-CAPILLARY: 133 mg/dL — AB (ref 65–99)
GLUCOSE-CAPILLARY: 180 mg/dL — AB (ref 65–99)
GLUCOSE-CAPILLARY: 170 mg/dL — AB (ref 65–99)
Glucose-Capillary: 127 mg/dL — ABNORMAL HIGH (ref 65–99)
Glucose-Capillary: 80 mg/dL (ref 65–99)

## 2016-01-27 LAB — CBC
HEMATOCRIT: 28.6 % — AB (ref 36.0–46.0)
Hemoglobin: 9 g/dL — ABNORMAL LOW (ref 12.0–15.0)
MCH: 27.9 pg (ref 26.0–34.0)
MCHC: 31.5 g/dL (ref 30.0–36.0)
MCV: 88.5 fL (ref 78.0–100.0)
PLATELETS: 365 10*3/uL (ref 150–400)
RBC: 3.23 MIL/uL — ABNORMAL LOW (ref 3.87–5.11)
RDW: 15.8 % — AB (ref 11.5–15.5)
WBC: 11.5 10*3/uL — AB (ref 4.0–10.5)

## 2016-01-27 LAB — TYPE AND SCREEN
ABO/RH(D): O POS
Antibody Screen: POSITIVE
DAT, IGG: NEGATIVE
UNIT DIVISION: 0
UNIT DIVISION: 0

## 2016-01-27 LAB — ACID FAST SMEAR (AFB)

## 2016-01-27 LAB — VANCOMYCIN, TROUGH: Vancomycin Tr: 29 ug/mL (ref 10.0–20.0)

## 2016-01-27 LAB — ACID FAST SMEAR (AFB, MYCOBACTERIA): Acid Fast Smear: NEGATIVE

## 2016-01-27 MED ORDER — HEPARIN SODIUM (PORCINE) 5000 UNIT/ML IJ SOLN: 5000.0000 [IU] | Freq: Three times a day (TID) | INTRAMUSCULAR | Status: DC

## 2016-01-27 MED ORDER — VANCOMYCIN HCL IN DEXTROSE 1-5 GM/200ML-% IV SOLN
1000.0000 mg | INTRAVENOUS | Status: DC
  Administered 2016-01-28 – 2016-01-29 (×2): 1000 mg via INTRAVENOUS
  Filled 2016-01-27 (×3): qty 200

## 2016-01-27 MED ORDER — TRAMADOL HCL 50 MG PO TABS
50.0000 mg | ORAL_TABLET | Freq: Four times a day (QID) | ORAL | Status: DC | PRN
Start: 2016-01-27 — End: 2016-01-28
  Administered 2016-01-27 – 2016-01-28 (×2): 50 mg via ORAL
  Filled 2016-01-27 (×2): qty 1

## 2016-01-27 NOTE — Progress Notes (Addendum)
      [address]       [place] [address]             [phone]       1 Day Post-Op Procedure(s) (LRB): VIDEO ASSISTED THORACOSCOPY (VATS) for drainage of loculated right pleural effusion (Right)  Subjective: Patient with nausea after given Oxy  Objective: Vital signs in last 24 hours: Temp:  [97.3 F (36.3 C)-98.4 F (36.9 C)] 98 F (36.7 C) (03/25 1311) Pulse Rate:  [57-83] 66 (03/25 1311) Cardiac Rhythm:  [-] Normal sinus rhythm (03/25 0700) Resp:  [15-25] 20 (03/25 0813) BP: (99-151)/(50-83) 150/67 mmHg (03/25 1311) SpO2:  [98 %-100 %] 100 % (03/25 1311) Arterial Line BP: (107-181)/(41-67) 154/67 mmHg (03/25 0800) Weight:  [171 lb 8.3 oz (77.8 kg)] 171 lb 8.3 oz (77.8 kg) (03/25 0500)     Intake/Output from previous day: 03/24 0701 - 03/25 0700 In: 1536.7 [I.V.:1086.7; IV Piggyback:450] Out: 1195 [Urine:1055; Blood:50; Chest Tube:90]   Physical Exam:  Cardiovascular: RRR Pulmonary: Slightly decreased at bases; no rales, wheezes, or rhonchi. Abdomen: Soft, non tender, bowel sounds present. Extremities: SCDs in place Wounds: Clean and dry.  No erythema or signs of infection. Chest Tube: to suction, no air leak  Lab Results: CBC: Recent Labs  01/26/16 0530 01/27/16 0505  WBC 8.6 11.5*  HGB 9.1* 9.0*  HCT 29.8* 28.6*  PLT 367 365   BMET:  Recent Labs  01/26/16 0530 01/27/16 0505  NA 140 141  K 3.9 3.6  CL 107 110  CO2 23 22  GLUCOSE 132* 128*  BUN 8 8  CREATININE 0.96 1.26*  CALCIUM 8.2* 8.0*    PT/INR: No results for input(s): LABPROT, INR in the last 72 hours. ABG:  INR: Will add last result for INR, ABG once components are confirmed Will add last 4 CBG results once components are confirmed  Assessment/Plan:  1. CV - SR. On Lopressor 25 mg bid. 2.  Pulmonary - Chest tube with 90 cc of output since surgery. Chest tube is to suction. There is no air leak. CXR shows no pneumothorax, small pleural effusions and  atelectasis. Place chest tube to water seal. Right pleural fluid showed NO MALIGNANT CELLS IDENTIFIED. ABUNDANT ACUTE INFLAMMATION.Encourage incentive spirometer. 3. Anemia-H and H stable at 9 and 28.6 4. Supplement potassium 5.ID-On empiric Vanco and Zosyn.  6. PE-anticoagulation on hold secondary to thoracic surgery 7. Remove a line 8. Has right femoral central line. Will ask this to be removed as has peripheral. 9. Stop Oxy as has allergy to codeine. Ultram PRN. Toradol is on order for now and continue with Fentanyl.  ZIMMERMAN,DONIELLE MPA-C 01/27/2016,1:18 PM   Chart reviewed, patient examined, agree with above. Chest tube output low. Will put to water seal. Needs to work on IS.

## 2016-01-27 NOTE — Progress Notes (Signed)
Pharmacy Antibiotic Note  Kirsten Gallegos is a 80 y.o. female with possible HCAP .  Pharmacy has been consulted for vancomycin and zosyn dosing (day 6). -Vancomycin level= 29 at ~ 7pm (~ 13 hrs post dose) on 750mg  IV q12h  Plan: -Change vancomycin to 1000mg  IV q24hr (next dose at 7am on 3/26)  -Will follow renal function, cultures and clinical progress   Height: 5\' 6"  (167.6 cm) Weight: 171 lb 8.3 oz (77.8 kg) IBW/kg (Calculated) : 59.3  Temp (24hrs), Avg:98 F (36.7 C), Min:97.5 F (36.4 C), Max:98.4 F (36.9 C)   Recent Labs Lab 01/22/16 2139  01/23/16 0605 01/24/16 0831 01/25/16 0440 01/26/16 0530 01/27/16 0505 01/27/16 1920  WBC  --   < > 9.1 7.8 7.5 8.6 11.5*  --   CREATININE  --   < > 1.17* 0.99 0.93 0.96 1.26*  --   LATICACIDVEN 1.0  --   --   --   --   --   --   --   VANCOTROUGH  --   --   --   --   --   --   --  29*  < > = values in this interval not displayed.  Estimated Creatinine Clearance: 39.4 mL/min (by C-G formula based on Cr of 1.26).    Allergies  Allergen Reactions  . Cephalexin Other (See Comments)    Pt states since she had meningitis, she not able to take antibiotic, but did not give reactions.   . Codeine Nausea And Vomiting    Antimicrobials this admission: Vanc 3/20>>     Vancomycin level= 29 on 750mg  IV q12h Zosyn 3/20>>  Microbiology results: 3/24 pleural fluid- ngtd 3/20 MRSA CPR- neg  Thank you for allowing pharmacy to be a part of this patient's care.  01/29/16, Pharm D 01/27/2016 8:30 PM

## 2016-01-27 NOTE — Progress Notes (Signed)
Pharmacy Antibiotic Note  Kirsten Gallegos is a 80 y.o. female admitted on 01/22/2016 with Loculated pleural effusion.  Pharmacy has been consulted for vancomycin/zosyn dosing. Pt is a transfer from [place] and was started on vancomycin and Zosyn there. Previous doses documented at [facility]: - 3/20 @ 0202 vancomycin 1 gram dose - 3/20 @ 1806 Zosyn 3.375 gram dose  Plan: 1) Continue Vancomycin 750mg  q12h 2) Continue Zosyn 3.375g IV q8h (4 hour infusion) 3) Vancomycin trough at 1730   Height: 5\' 6"  (167.6 cm) Weight: 171 lb 8.3 oz (77.8 kg) IBW/kg (Calculated) : 59.3  Temp (24hrs), Avg:97.7 F (36.5 C), Min:97.3 F (36.3 C), Max:98.4 F (36.9 C)   Recent Labs Lab 01/22/16 2139  01/23/16 0605 01/24/16 0831 01/25/16 0440 01/26/16 0530 01/27/16 0505  WBC  --   < > 9.1 7.8 7.5 8.6 11.5*  CREATININE  --   < > 1.17* 0.99 0.93 0.96 1.26*  LATICACIDVEN 1.0  --   --   --   --   --   --   < > = values in this interval not displayed.  Estimated Creatinine Clearance: 39.4 mL/min (by C-G formula based on Cr of 1.26).    Allergies  Allergen Reactions  . Cephalexin Other (See Comments)    Pt states since she had meningitis, she not able to take antibiotic, but did not give reactions.   . Codeine Nausea And Vomiting    Antimicrobials this admission: 3/19 Zosyn >>  3/20 Vancomycin >>   Dose adjustments this admission: Vancomycin increased from 750mg  daily to 750 mg q12h on 3/23   Thank you for allowing 4/20 to participate in this patients care.  , PharmD Clinical Pharmacy Resident Pager: [phone] 01/27/2016 2:14 PM

## 2016-01-27 NOTE — Progress Notes (Signed)
PT Cancellation Note  Patient Details Name: Kirsten Gallegos MRN: [id number] DOB: 1936-11-02   Cancelled Treatment:    Reason Eval/Treat Not Completed: Medical issues which prohibited therapy.  Patient s/p VATS 01/26/16 and has femoral line.  Will return tomorrow for PT session as appropriate.   Kirsten Gallegos 01/27/2016, 11:31 AM Durenda Hurt. Renaldo Fiddler, [facility] Pager [phone]
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
## 2016-01-28 ENCOUNTER — Inpatient Hospital Stay (HOSPITAL_COMMUNITY): Payer: Medicare Other

## 2016-01-28 DIAGNOSIS — E1165 Type 2 diabetes mellitus with hyperglycemia: Secondary | ICD-10-CM

## 2016-01-28 DIAGNOSIS — E118 Type 2 diabetes mellitus with unspecified complications: Secondary | ICD-10-CM

## 2016-01-28 DIAGNOSIS — E119 Type 2 diabetes mellitus without complications: Secondary | ICD-10-CM

## 2016-01-28 LAB — COMPREHENSIVE METABOLIC PANEL
ALBUMIN: 1.8 g/dL — AB (ref 3.5–5.0)
ALT: 9 U/L — ABNORMAL LOW (ref 14–54)
ANION GAP: 8 (ref 5–15)
AST: 17 U/L (ref 15–41)
Alkaline Phosphatase: 63 U/L (ref 38–126)
BILIRUBIN TOTAL: 0.3 mg/dL (ref 0.3–1.2)
BUN: 10 mg/dL (ref 6–20)
CALCIUM: 8.3 mg/dL — AB (ref 8.9–10.3)
CO2: 21 mmol/L — ABNORMAL LOW (ref 22–32)
Chloride: 110 mmol/L (ref 101–111)
Creatinine, Ser: 1.24 mg/dL — ABNORMAL HIGH (ref 0.44–1.00)
GFR, EST AFRICAN AMERICAN: 47 mL/min — AB (ref >60)
GFR, EST NON AFRICAN AMERICAN: 41 mL/min — AB (ref >60)
GLUCOSE: 98 mg/dL (ref 65–99)
POTASSIUM: 3.4 mmol/L — AB (ref 3.5–5.1)
Sodium: 139 mmol/L (ref 135–145)
TOTAL PROTEIN: 5.3 g/dL — AB (ref 6.5–8.1)

## 2016-01-28 LAB — C DIFFICILE QUICK SCREEN W PCR REFLEX
C DIFFICILE (CDIFF) INTERP: NEGATIVE
C DIFFICILE (CDIFF) TOXIN: NEGATIVE
C DIFFICLE (CDIFF) ANTIGEN: NEGATIVE

## 2016-01-28 LAB — GLUCOSE, CAPILLARY
GLUCOSE-CAPILLARY: 103 mg/dL — AB (ref 65–99)
GLUCOSE-CAPILLARY: 205 mg/dL — AB (ref 65–99)
GLUCOSE-CAPILLARY: 80 mg/dL (ref 65–99)
Glucose-Capillary: 82 mg/dL (ref 65–99)
Glucose-Capillary: 96 mg/dL (ref 65–99)
Glucose-Capillary: 153 mg/dL — ABNORMAL HIGH (ref 65–99)
Glucose-Capillary: 149 mg/dL — ABNORMAL HIGH (ref 65–99)

## 2016-01-28 LAB — CBC
HEMATOCRIT: 29.1 % — AB (ref 36.0–46.0)
Hemoglobin: 8.7 g/dL — ABNORMAL LOW (ref 12.0–15.0)
MCH: 26.7 pg (ref 26.0–34.0)
MCHC: 29.9 g/dL — AB (ref 30.0–36.0)
MCV: 89.3 fL (ref 78.0–100.0)
Platelets: 353 10*3/uL (ref 150–400)
RBC: 3.26 MIL/uL — ABNORMAL LOW (ref 3.87–5.11)
RDW: 16.2 % — AB (ref 11.5–15.5)
WBC: 15.3 10*3/uL — ABNORMAL HIGH (ref 4.0–10.5)

## 2016-01-28 LAB — CULTURE, BODY FLUID W GRAM STAIN -BOTTLE

## 2016-01-28 LAB — CULTURE, BODY FLUID-BOTTLE: CULTURE: NO GROWTH

## 2016-01-28 MED ORDER — ZOLPIDEM TARTRATE 5 MG PO TABS
5.0000 mg | ORAL_TABLET | Freq: Once | ORAL | Status: AC
  Administered 2016-01-28: 5 mg via ORAL
  Filled 2016-01-28: qty 1

## 2016-01-28 MED ORDER — MAGNESIUM SULFATE 2 GM/50ML IV SOLN
2.0000 g | Freq: Once | INTRAVENOUS | Status: AC
  Administered 2016-01-28: 2 g via INTRAVENOUS
  Filled 2016-01-28: qty 50

## 2016-01-28 MED ORDER — FUROSEMIDE 10 MG/ML IJ SOLN
40.0000 mg | Freq: Once | INTRAMUSCULAR | Status: AC
  Administered 2016-01-28: 40 mg via INTRAVENOUS
  Filled 2016-01-28: qty 4

## 2016-01-28 MED ORDER — POTASSIUM CHLORIDE CRYS ER 20 MEQ PO TBCR
40.0000 meq | EXTENDED_RELEASE_TABLET | Freq: Once | ORAL | Status: AC
  Administered 2016-01-28: 40 meq via ORAL
  Filled 2016-01-28: qty 2

## 2016-01-28 MED ORDER — FENTANYL CITRATE (PF) 100 MCG/2ML IJ SOLN
25.0000 ug | INTRAMUSCULAR | Status: DC | PRN
  Administered 2016-01-28 – 2016-02-02 (×7): 25 ug via INTRAVENOUS
  Filled 2016-01-28 (×7): qty 2

## 2016-01-28 MED ORDER — ACETAMINOPHEN 325 MG PO TABS
650.0000 mg | ORAL_TABLET | Freq: Four times a day (QID) | ORAL | Status: DC | PRN
  Administered 2016-01-28 – 2016-02-01 (×3): 650 mg via ORAL
  Filled 2016-01-28 (×3): qty 2

## 2016-01-28 NOTE — Progress Notes (Signed)
Right Chest Tube discontinued per Jacques Earthly, P.A. order.  Vaseline gauze applied to site.  Portable CXR ordered s/p discontinuation of tube.  Patient pre-medicated and tolerated procedure well. Current 02 Sats 98% on RA.

## 2016-01-28 NOTE — Progress Notes (Addendum)
      [address]       [place] [address]             [phone]       2 Days Post-Op Procedure(s) (LRB): VIDEO ASSISTED THORACOSCOPY (VATS) for drainage of loculated right pleural effusion (Right)  Subjective: Patient continues with nausea. She had emesis yesterday as well. She denies abdominal pain.  Objective: Vital signs in last 24 hours: Temp:  [97.7 F (36.5 C)-98.4 F (36.9 C)] 98 F (36.7 C) (03/26 0809) Pulse Rate:  [58-98] 65 (03/26 0809) Cardiac Rhythm:  [-] Normal sinus rhythm (03/26 0400) Resp:  [13-22] 21 (03/26 0809) BP: (106-157)/(54-91) 152/54 mmHg (03/26 0809) SpO2:  [95 %-100 %] 97 % (03/26 0809) Weight:  [169 lb 5 oz (76.8 kg)] 169 lb 5 oz (76.8 kg) (03/26 0430)     Intake/Output from previous day: 03/25 0701 - 03/26 0700 In: 1031.1 [P.O.:240; I.V.:641.1; IV Piggyback:150] Out: 855 [Urine:765; Chest Tube:90]   Physical Exam:  Cardiovascular: RRR Pulmonary: Slightly decreased at bases R>L; no rales, wheezes, or rhonchi. Abdomen: Soft, non tender, bowel sounds present. Extremities: SCDs in place Wounds: Clean and dry.  No erythema or signs of infection. Chest Tube: to water seal, no air leak  Lab Results: CBC:  Recent Labs  01/27/16 0505 01/28/16 0245  WBC 11.5* 15.3*  HGB 9.0* 8.7*  HCT 28.6* 29.1*  PLT 365 353   BMET:   Recent Labs  01/27/16 0505 01/28/16 0245  NA 141 139  K 3.6 3.4*  CL 110 110  CO2 22 21*  GLUCOSE 128* 98  BUN 8 10  CREATININE 1.26* 1.24*  CALCIUM 8.0* 8.3*    PT/INR: No results for input(s): LABPROT, INR in the last 72 hours. ABG:  INR: Will add last result for INR, ABG once components are confirmed Will add last 4 CBG results once components are confirmed  Assessment/Plan:  1. CV - SR. On Lopressor 25 mg bid. 2.  Pulmonary - Chest tube output not recorded last 12 hours, but appears to have had very little output. Chest tube is to water seal. There is no air leak. CXR appears  to show patient is rotated to the right, no pneumothorax, small pleural effusions and atelectasis, consolidation at right base. Encourage incentive spirometer and flutter valve.  Check CXR in am. Encourage incentive spirometer and flutter valve 3. Anemia-H and H stable at 8.7 and 29.1 4. Supplement potassium 5.ID-On empiric Vanco and Zosyn.  6. PE-anticoagulation on hold secondary to thoracic surgery 7.GI-persistent nausea. On Ultram PRN, Fentanyl IV PRN, and Toradol PRN. Zofran for N/V. Will increase IVF as not taking much orally. N/V likely related to narcotics. Per Dr. Laneta Simmers, will remove chest tube and decrease narcotics.   ZIMMERMAN,DONIELLE MPA-C 01/28/2016,9:32 AM    Chart reviewed, patient examined, agree with above. CXR ok. The RLL is still atelectatic. Work on IS. Will remove the tube since it is not draining much and she is having a lot of pain.

## 2016-01-28 NOTE — Progress Notes (Signed)
[facility] PROGRESS NOTE  Kirsten Gallegos PIR:[id number] DOB: February 08, 1936 DOA: 01/22/2016  PCP: Quentin Mulling, MD  Brief HPI: 80 year-old female who presented to an outside hospital with shortness of breath or fever. She had a nonproductive cough at that time. She is noted to have some chest pain on the right. Her son brought her to the emergency department. In the emergency room she had a right lower lobe pulmonary embolism noted on CT chest question of RV strain. Follow-up echocardiogram showed no evidence of RV strain. She was transferred to [facility] for further evaluation. Patient underwent VATS on 3/24 for right loculated pleural effusion. Cardiothoracic surgery recommends to hold anticoagulation for 1 week prior to resuming it. Consider NOAC when it is safe to start anticoagulation  Past medical history:  Past Medical History  Diagnosis Date  . Reflux   . Hypertension   . Rheumatoid arthritis (HCC)   . Heart attack [facility])     Consultants: Cardiothoracic surgery. Cardiology. Critical care medicine.  Procedures:  Echocardiogram Study Conclusions - Left ventricle: The cavity size was normal. Wall thickness was increased in a pattern of moderate LVH. Systolic function was normal. The estimated ejection fraction was in the range of 60% to 65%. Wall motion was normal; there were no regional wall motion abnormalities. Doppler parameters are consistent with abnormal left ventricular relaxation (grade 1 diastolic dysfunction). The E/e&' ratio is >15, suggesting elevated LV Filling pressure. - Aortic valve: Sclerosis without stenosis. There was mild regurgitation. - Mitral valve: Calcified annulus. Mildly thickened leaflets . There was trivial regurgitation. - Left atrium: The atrium was normal in size. - Tricuspid valve: There was mild regurgitation. - Pulmonary arteries: PA peak pressure: 32 mm Hg (S). - Inferior vena cava: The vessel was normal in size. The respirophasic  diameter changes were in the normal range (>= 50%), consistent with normal central venous pressure. - Pericardium, extracardiac: There was no pericardial effusion. There was a left pleural effusion. Impressions: - LVEF 60-65%, moderate LVH, normal wall motion, diastolic dysfunction, elevated LV filling pressure, aortic sclerosis with mild AI, trvial MR, normal LA size, mild TR, RVSP 32 mmHg, normal IVC, no significant pericardial effusion, left pleural effusion.  Lower extremity venous Doppler No DVT  VATS on March 24  Antibiotics: Zosyn and vancomycin  Subjective: Patient complains of pain around her chest tube site. Denies any shortness of breath. No nausea or vomiting. Otherwise feels well.  Objective: Vital Signs  Filed Vitals:   01/28/16 0430 01/28/16 0500 01/28/16 0600 01/28/16 0809  BP:    152/54  Pulse:  62 60 65  Temp:    98 F (36.7 C)  TempSrc:    Oral  Resp:  15 14 21   Height:      Weight: 76.8 kg (169 lb 5 oz)     SpO2:  95% 96% 97%    Intake/Output Summary (Last 24 hours) at 01/28/16 1102 Last data filed at 01/28/16 0600  Gross per 24 hour  Intake 781.08 ml  Output    805 ml  Net -23.92 ml   Filed Weights   01/26/16 0500 01/27/16 0500 01/28/16 0430  Weight: 77.3 kg (170 lb 6.7 oz) 77.8 kg (171 lb 8.3 oz) 76.8 kg (169 lb 5 oz)    General appearance: alert, cooperative, appears stated age and no distress Resp: Diminished air entry at the right base. Crackles are present. No wheezing or rhonchi. Chest wall: Chest tube noted. Right chest posteriorly Cardio: regular rate and rhythm,  S1, S2 normal, no murmur, click, rub or gallop GI: soft, non-tender; bowel sounds normal; no masses,  no organomegaly Extremities: extremities normal, atraumatic, no cyanosis or edema Neurologic: Patient is awake and alert. Oriented 3. No cranial nerve deficits. Motor strength equal bilateral upper and lower extremities.  Lab Results:  Basic Metabolic Panel:  Recent  Labs Lab 01/22/16 2203 01/23/16 0605 01/24/16 0831 01/25/16 0440 01/26/16 0530 01/27/16 0505 01/28/16 0245  NA 138 138 139 138 140 141 139  K 4.2 3.4* 3.5 3.4* 3.9 3.6 3.4*  CL 106 105 103 106 107 110 110  CO2 20* 23 23 22 23 22  21*  GLUCOSE 181* 96 89 96 132* 128* 98  BUN 17 15 13 8 8 8 10   CREATININE 1.14* 1.17* 0.99 0.93 0.96 1.26* 1.24*  CALCIUM 7.7* 7.8* 8.3* 7.9* 8.2* 8.0* 8.3*  MG 1.6* 1.6*  --   --   --   --   --   PHOS 2.5 1.9*  --   --   --   --   --    Liver Function Tests:  Recent Labs Lab 01/22/16 2203 01/24/16 0831 01/26/16 0530 01/27/16 0505 01/28/16 0245  AST 19 16 16 20 17   ALT 11* 10* 9* 10* 9*  ALKPHOS 77 76 66 61 63  BILITOT 0.2* 0.5 0.4 0.4 0.3  PROT 5.6* 5.8* 5.7* 5.5* 5.3*  ALBUMIN 1.9* 1.8* 1.8* 1.9* 1.8*   CBC:  Recent Labs Lab 01/24/16 0831 01/25/16 0440 01/26/16 0530 01/27/16 0505 01/28/16 0245  WBC 7.8 7.5 8.6 11.5* 15.3*  HGB 10.3* 9.1* 9.1* 9.0* 8.7*  HCT 32.3* 29.5* 29.8* 28.6* 29.1*  MCV 86.1 86.3 87.1 88.5 89.3  PLT 379 366 367 365 353   Cardiac Enzymes:  Recent Labs Lab 01/22/16 2203  TROPONINI 0.07*    CBG:  Recent Labs Lab 01/27/16 1526 01/27/16 2102 01/28/16 0008 01/28/16 0440 01/28/16 0812  GLUCAP 170* 103* 80 82 96    Recent Results (from the past 240 hour(s))  MRSA PCR Screening     Status: None   Collection Time: 01/22/16  8:18 PM  Result Value Ref Range Status   MRSA by PCR NEGATIVE NEGATIVE Final    Comment:        The GeneXpert MRSA Assay (FDA approved for NASAL specimens only), is one component of a comprehensive MRSA colonization surveillance program. It is not intended to diagnose MRSA infection nor to guide or monitor treatment for MRSA infections.   Culture, body fluid-bottle     Status: None (Preliminary result)   Collection Time: 01/23/16 12:49 PM  Result Value Ref Range Status   Specimen Description FLUID RIGHT PLEURAL  Final   Special Requests NONE  Final   Culture NO  GROWTH 4 DAYS  Final   Report Status PENDING  Incomplete  Gram stain     Status: None   Collection Time: 01/23/16 12:49 PM  Result Value Ref Range Status   Specimen Description FLUID RIGHT PLEURAL  Final   Special Requests NONE  Final   Gram Stain   Final    ABUNDANT WBC PRESENT,BOTH PMN AND MONONUCLEAR NO ORGANISMS SEEN    Report Status 01/23/2016 FINAL  Final  Acid Fast Smear (AFB)     Status: None   Collection Time: 01/26/16  3:10 PM  Result Value Ref Range Status   AFB Specimen Processing Concentration  Final   Acid Fast Smear Negative  Final    Comment: (NOTE) Performed At: [facility]  [place] [address] [place], [place] [id number] Mila Homer MD YJ:[id number]    Source (AFB) FLUID  Final    Comment: PLEURAL RIGHT   Culture, body fluid-bottle     Status: None (Preliminary result)   Collection Time: 01/26/16  3:10 PM  Result Value Ref Range Status   Specimen Description FLUID PLEURAL RIGHT  Final   Special Requests BOTTLES DRAWN AEROBIC AND ANAEROBIC 10CC  Final   Culture NO GROWTH < 24 HOURS  Final   Report Status PENDING  Incomplete  Gram stain     Status: None   Collection Time: 01/26/16  3:10 PM  Result Value Ref Range Status   Specimen Description FLUID PLEURAL RIGHT  Final   Special Requests NONE  Final   Gram Stain   Final    WBC PRESENT,BOTH PMN AND MONONUCLEAR NO ORGANISMS SEEN CYTOSPIN    Report Status 01/26/2016 FINAL  Final      Studies/Results: Dg Chest Port 1 View  01/28/2016  CLINICAL DATA:  History of RIGHT chest tube with pneumothorax. EXAM: PORTABLE CHEST 1 VIEW COMPARISON:  Yesterday's radiograph. FINDINGS: Large bore thoracostomy tube on the RIGHT appears unchanged. There is no pneumothorax. Moderate fluid is seen at the RIGHT base, stable. Bibasilar subsegmental atelectasis, slightly improved, given the increased lung volumes. Cardiomegaly. IMPRESSION: Stable exam. Electronically Signed   By: Elsie Stain M.D.   On: 01/28/2016 09:56    Dg Chest Port 1 View  01/27/2016  CLINICAL DATA:  80 year old female with a history of right chest tube EXAM: PORTABLE CHEST 1 VIEW COMPARISON:  01/26/2016 FINDINGS: Cardiomediastinal silhouette unchanged in size and contour. Atherosclerotic calcifications of the aortic arch. Opacities at the bilateral lung bases partially obscuring the heart borders, and obscuring the bilateral hemidiaphragm. Thickening of the right pleural parenchymal interface. Unchanged position of right large bore thoracostomy tube, which appears to course through the fissure and then over the hilar structures terminating inferiorly. No visualized pneumothorax. IMPRESSION: Unchanged position of right thoracostomy tube.  No pneumothorax. Opacities at the bilateral lung bases reflecting small pleural effusions and associated atelectasis/ scarring. Infection not excluded. Atherosclerosis. Signed, Yvone Neu. Loreta Ave, DO [facility] Electronically Signed   By: Gilmer Mor D.O.   On: 01/27/2016 07:40   Dg Chest Port 1 View  01/26/2016  CLINICAL DATA:  Status post video-assisted thoracoscopy optic surgery with chest tube placement on the right EXAM: PORTABLE CHEST 1 VIEW COMPARISON:  Chest radiograph January 23, 2016 and chest CT January 24, 2016 FINDINGS: There is a chest tube on the right with the tip directed inferiorly and medially. There is no appreciable pneumothorax. There is mild subcutaneous air on the right, however. There is been significant resolution of pleural effusion on the right since chest tube placement with only small residual right effusion. There is a small pleural effusion on the left, stable. There is patchy bibasilar atelectasis. Heart is upper normal in size with pulmonary vascular within normal limits. No adenopathy. There is extensive arthropathy in the left shoulder. IMPRESSION: Right chest tube present. There is subcutaneous air on the right but no apparent  pneumothorax. There are currently small bilateral pleural effusions with patchy bibasilar atelectasis. Heart upper normal in size, stable. Electronically Signed   By: Bretta Bang III M.D.   On: 01/26/2016 16:28    Medications:  Scheduled: . acetaminophen  1,000 mg Oral 4 times per day   Or  . acetaminophen (TYLENOL) oral liquid 160 mg/5 mL  1,000 mg Oral 4 times per day  . aspirin  81 mg Oral Daily  . bisacodyl  10 mg Oral Daily  . guaiFENesin  600 mg Oral BID  . insulin aspart  0-24 Units Subcutaneous 6 times per day  . levothyroxine  100 mcg Oral QAC breakfast  . magnesium sulfate 1 - 4 g bolus IVPB  2 g Intravenous Once  . metoprolol tartrate  25 mg Oral BID  . piperacillin-tazobactam (ZOSYN)  IV  3.375 g Intravenous Q8H  . predniSONE  10 mg Oral Q breakfast  . senna-docusate  1 tablet Oral QHS  . sodium chloride flush  10-40 mL Intracatheter Q12H  . vancomycin  1,000 mg Intravenous Q24H   Continuous: . dextrose 5 % and 0.45 % NaCl with KCl 20 mEq/L 75 mL/hr at 01/28/16 0941   LNZ:VJKQASUO (SUBLIMAZE) injection, ketorolac, levalbuterol, ondansetron (ZOFRAN) IV, potassium chloride, sodium chloride flush, traMADol  Assessment/Plan:  Active Problems:   Pulmonary embolism (HCC)   Pleural effusion   Pericardial effusion   Rheumatoid arthritis with positive rheumatoid factor (HCC)   Pressure ulcer   Pulmonary embolism with acute cor pulmonale (HCC)   Pleural effusion on right   Shortness of breath   Hypoxemia    Acute pulmonary embolism Patient is status post IVC filter placement prior to her VATS procedure. Per cardiothoracic anticoagulation could be started 1 week after her procedure. Echocardiogram report as above. Patient denies any chest pain or shortness of breath.  Loculated Pleural effusion Parapneumonic vs secondary to rib fractures. Ultrasound-guidedthoracentesis yielded 50 mL of very thick bloody effusion. Consulted Dr. Laneta Simmers, who recommended repeat CT  scan. This showed large loculated right pleural effusion with compressive atelectasis. Currently on empiric antibiotics Zosyn, vancomycin. Cultures are negative so far.Would need to hold anticoagulation for 1 weekpost surgery, per Dr. Laneta Simmers. Patient currently has a chest tube in place.   Pericardial effusion-  Patient had a 2-D echo to evaluate for pericardial effusion. Cardiology was consulted. Echo showed only a small pericardial effusion. No further workup indicated at this time per cardiology.  Rib fractures Pain control. PT and OT evaluation.  History of diabetes mellitus type 2 Continue sliding scale coverage. HbA1c 11.7.  Hypothyroidism Continue Synthroid  History of rheumatoid arthritis on Chronic steroids? Her initial home medications showed that he she was on prednisone 10 mg daily. Current home medication list does not suggest same. She does have a history of rheumatoid arthritis. Her chronic prednisone use. Will need to be verified with the patient.  Essential hypertension Patient was on metoprolol at home. Continue to monitor blood pressures.  Elevated creatinine Patient has been positive more than 4 L since this hospitalization. She hasn't had much urine output in the last 24 hours. We'll give her a dose of Lasix to see this in a response.  Normocytic anemia Hemoglobin noted to trend down slightly. No overt bleeding. Monitor closely for now  Hypokalemia This will be repleted  Stage II pressure ulcer noted to the right heel, left buttock and an unstageable pressure injury. Right lateral foot Seen by wound care. Air mattress.  DVT Prophylaxis: SCDs    Code Status: Full code  Family Communication: Discussed with the patient. No family at bedside  Disposition Plan: Remain in step down for now. Chest tube per cardiothoracic.    LOS: 6 days   [facility]  [facility] Pager [phone] 01/28/2016, 11:02 AM  If 7PM-7AM, please contact night-coverage at  [email or web address], password [facility]

## 2016-01-29 ENCOUNTER — Encounter (HOSPITAL_COMMUNITY): Payer: Self-pay | Admitting: Surgery

## 2016-01-29 ENCOUNTER — Inpatient Hospital Stay (HOSPITAL_COMMUNITY): Payer: Medicare Other

## 2016-01-29 DIAGNOSIS — M069 Rheumatoid arthritis, unspecified: Secondary | ICD-10-CM

## 2016-01-29 DIAGNOSIS — R1110 Vomiting, unspecified: Secondary | ICD-10-CM | POA: Insufficient documentation

## 2016-01-29 DIAGNOSIS — R112 Nausea with vomiting, unspecified: Secondary | ICD-10-CM

## 2016-01-29 LAB — GLUCOSE, CAPILLARY
GLUCOSE-CAPILLARY: 113 mg/dL — AB (ref 65–99)
GLUCOSE-CAPILLARY: 126 mg/dL — AB (ref 65–99)
GLUCOSE-CAPILLARY: 99 mg/dL (ref 65–99)
Glucose-Capillary: 164 mg/dL — ABNORMAL HIGH (ref 65–99)
Glucose-Capillary: 230 mg/dL — ABNORMAL HIGH (ref 65–99)
Glucose-Capillary: 112 mg/dL — ABNORMAL HIGH (ref 65–99)

## 2016-01-29 LAB — CBC
HEMATOCRIT: 31.1 % — AB (ref 36.0–46.0)
Hemoglobin: 9.4 g/dL — ABNORMAL LOW (ref 12.0–15.0)
MCH: 26.4 pg (ref 26.0–34.0)
MCHC: 30.2 g/dL (ref 30.0–36.0)
MCV: 87.4 fL (ref 78.0–100.0)
PLATELETS: 348 10*3/uL (ref 150–400)
RBC: 3.56 MIL/uL — AB (ref 3.87–5.11)
RDW: 15.9 % — AB (ref 11.5–15.5)
WBC: 15.9 10*3/uL — AB (ref 4.0–10.5)

## 2016-01-29 LAB — BASIC METABOLIC PANEL
ANION GAP: 11 (ref 5–15)
BUN: 10 mg/dL (ref 6–20)
CALCIUM: 8.3 mg/dL — AB (ref 8.9–10.3)
CO2: 20 mmol/L — AB (ref 22–32)
Chloride: 106 mmol/L (ref 101–111)
Creatinine, Ser: 1.27 mg/dL — ABNORMAL HIGH (ref 0.44–1.00)
GFR, EST AFRICAN AMERICAN: 46 mL/min — AB (ref >60)
GFR, EST NON AFRICAN AMERICAN: 40 mL/min — AB (ref >60)
Glucose, Bld: 112 mg/dL — ABNORMAL HIGH (ref 65–99)
POTASSIUM: 3.6 mmol/L (ref 3.5–5.1)
Sodium: 137 mmol/L (ref 135–145)

## 2016-01-29 LAB — TYPE AND SCREEN
ABO/RH(D): O POS
Antibody Screen: NEGATIVE
Unit division: 0
Unit division: 0

## 2016-01-29 LAB — HEPATIC FUNCTION PANEL
ALK PHOS: 81 U/L (ref 38–126)
ALT: 11 U/L — ABNORMAL LOW (ref 14–54)
AST: 20 U/L (ref 15–41)
Albumin: 1.9 g/dL — ABNORMAL LOW (ref 3.5–5.0)
BILIRUBIN TOTAL: 0.3 mg/dL (ref 0.3–1.2)
Total Protein: 5.8 g/dL — ABNORMAL LOW (ref 6.5–8.1)

## 2016-01-29 LAB — MAGNESIUM: Magnesium: 2.1 mg/dL (ref 1.7–2.4)

## 2016-01-29 MED ORDER — PANTOPRAZOLE SODIUM 40 MG IV SOLR
40.0000 mg | Freq: Two times a day (BID) | INTRAVENOUS | Status: DC
  Administered 2016-01-29 (×2): 40 mg via INTRAVENOUS
  Filled 2016-01-29 (×2): qty 40

## 2016-01-29 MED ORDER — INSULIN ASPART 100 UNIT/ML ~~LOC~~ SOLN
0.0000 [IU] | Freq: Three times a day (TID) | SUBCUTANEOUS | Status: DC
  Administered 2016-01-30: 2 [IU] via SUBCUTANEOUS
  Administered 2016-01-30: 8 [IU] via SUBCUTANEOUS
  Administered 2016-01-30 – 2016-01-31 (×2): 4 [IU] via SUBCUTANEOUS
  Administered 2016-01-31: 8 [IU] via SUBCUTANEOUS
  Administered 2016-01-31: 3 [IU] via SUBCUTANEOUS
  Administered 2016-02-01: 12 [IU] via SUBCUTANEOUS
  Administered 2016-02-01: 8 [IU] via SUBCUTANEOUS
  Administered 2016-02-01: 2 [IU] via SUBCUTANEOUS
  Administered 2016-02-02: 16 [IU] via SUBCUTANEOUS
  Administered 2016-02-02: 2 [IU] via SUBCUTANEOUS

## 2016-01-29 MED ORDER — LOPERAMIDE HCL 2 MG PO CAPS: 2.0000 mg | ORAL_CAPSULE | Freq: Three times a day (TID) | ORAL | Status: DC | PRN

## 2016-01-29 MED ORDER — ZOLPIDEM TARTRATE 5 MG PO TABS
5.0000 mg | ORAL_TABLET | Freq: Once | ORAL | Status: AC
  Administered 2016-01-29: 5 mg via ORAL
  Filled 2016-01-29: qty 1

## 2016-01-29 MED ORDER — SODIUM CHLORIDE 0.9 % IV SOLN
3.0000 g | Freq: Three times a day (TID) | INTRAVENOUS | Status: DC
  Administered 2016-01-29 – 2016-02-01 (×8): 3 g via INTRAVENOUS
  Filled 2016-01-29 (×11): qty 3

## 2016-01-29 MED ORDER — POTASSIUM CHLORIDE CRYS ER 20 MEQ PO TBCR
40.0000 meq | EXTENDED_RELEASE_TABLET | Freq: Once | ORAL | Status: AC
  Administered 2016-01-29: 40 meq via ORAL
  Filled 2016-01-29: qty 2

## 2016-01-29 MED ORDER — HYDROCODONE-ACETAMINOPHEN 5-325 MG PO TABS
1.0000 | ORAL_TABLET | Freq: Four times a day (QID) | ORAL | Status: DC | PRN
  Administered 2016-01-29 – 2016-02-01 (×11): 1 via ORAL
  Filled 2016-01-29 (×13): qty 1

## 2016-01-29 NOTE — Progress Notes (Addendum)
      [address]       [place] [address]             [phone]       3 Days Post-Op Procedure(s) (LRB): VIDEO ASSISTED THORACOSCOPY (VATS) for drainage of loculated right pleural effusion (Right)  Subjective: Patient with less nausea and is eating a salad.  Objective: Vital signs in last 24 hours: Temp:  [97.9 F (36.6 C)-98.7 F (37.1 C)] 98 F (36.7 C) (03/27 0810) Pulse Rate:  [62-98] 98 (03/27 0810) Cardiac Rhythm:  [-] Normal sinus rhythm (03/27 0800) Resp:  [15-20] 19 (03/27 0810) BP: (116-147)/(51-80) 138/79 mmHg (03/27 0810) SpO2:  [96 %-98 %] 96 % (03/27 0810) Weight:  [167 lb 8.8 oz (76 kg)] 167 lb 8.8 oz (76 kg) (03/27 0400)     Intake/Output from previous day: 03/26 0701 - 03/27 0700 In: 1249.3 [I.V.:1099.3; IV Piggyback:150] Out: 2950 [Urine:2750; Chest Tube:200]   Physical Exam:  Cardiovascular: RRR Pulmonary: Slightly decreased at bases R>L; no rales, wheezes, or rhonchi. Abdomen: Soft, non tender, bowel sounds present. Extremities: SCDs in place Wounds: Clean and dry.  No erythema or signs of infection.   Lab Results: CBC:  Recent Labs  01/28/16 0245 01/29/16 0330  WBC 15.3* 15.9*  HGB 8.7* 9.4*  HCT 29.1* 31.1*  PLT 353 348   BMET:   Recent Labs  01/28/16 0245 01/29/16 0330  NA 139 137  K 3.4* 3.6  CL 110 106  CO2 21* 20*  GLUCOSE 98 112*  BUN 10 10  CREATININE 1.24* 1.27*  CALCIUM 8.3* 8.3*    PT/INR: No results for input(s): LABPROT, INR in the last 72 hours. ABG:  INR: Will add last result for INR, ABG once components are confirmed Will add last 4 CBG results once components are confirmed  Assessment/Plan:  1. CV - SR. On Lopressor 25 mg bid. 2.  Pulmonary - Chest tube removed yesterday. CXR shows cardiomegaly, vascular prominence, low lung volumes, no pneumothorax, small right pleural effusion. Encourage incentive spirometer and flutter valve.  Check CXR in am. Encourage incentive spirometer  and flutter valve 3. Anemia-H and H stable at 9.4 and 31.1 4. Supplement potassium 5.ID-On empiric Unasyn 6. PE-anticoagulation on hold secondary to thoracic surgery 7.GI-less nausea. Abd film results noted. Patient states takes Vicodin PRN at home. Will order PRN. Still on  IVF as not taking much orally, but will decrease if tolerates lunch.   ZIMMERMAN,DONIELLE MPA-C 01/29/2016,12:28 PM     Chart reviewed, patient examined, agree with above. CXR looks fine with some residual pleural thickening and atelectasis on the right. Continue IS and flutter valve.

## 2016-01-29 NOTE — Care Management Important Message (Signed)
Important Message  Patient Details  Name: Kirsten Gallegos MRN: [id number] Date of Birth: November 17, 1935   Medicare Important Message Given:  Yes    Bernadette Hoit 01/29/2016, 12:40 PM

## 2016-01-29 NOTE — Progress Notes (Signed)
[facility] PROGRESS NOTE  Dorcus Riga HUT:[id number] DOB: 03-19-1936 DOA: 01/22/2016  PCP: Quentin Mulling, MD  Brief HPI: 80 year-old female who presented to an outside hospital with shortness of breath or fever. She had a nonproductive cough at that time. She is noted to have some chest pain on the right. Her son brought her to the emergency department. In the emergency room she had a right lower lobe pulmonary embolism noted on CT chest question of RV strain. Follow-up echocardiogram showed no evidence of RV strain. She was transferred to [facility] for further evaluation. Patient underwent VATS on 3/24 for right loculated pleural effusion. Cardiothoracic surgery recommends to hold anticoagulation for 1 week prior to resuming it. Consider NOAC when it is safe to start anticoagulation  Past medical history:  Past Medical History  Diagnosis Date  . Reflux   . Hypertension   . Rheumatoid arthritis (HCC)   . Heart attack [facility])     Consultants: Cardiothoracic surgery. Cardiology. Critical care medicine.  Procedures:  Echocardiogram Study Conclusions - Left ventricle: The cavity size was normal. Wall thickness was increased in a pattern of moderate LVH. Systolic function was normal. The estimated ejection fraction was in the range of 60% to 65%. Wall motion was normal; there were no regional wall motion abnormalities. Doppler parameters are consistent with abnormal left ventricular relaxation (grade 1 diastolic dysfunction). The E/e&' ratio is >15, suggesting elevated LV Filling pressure. - Aortic valve: Sclerosis without stenosis. There was mild regurgitation. - Mitral valve: Calcified annulus. Mildly thickened leaflets . There was trivial regurgitation. - Left atrium: The atrium was normal in size. - Tricuspid valve: There was mild regurgitation. - Pulmonary arteries: PA peak pressure: 32 mm Hg (S). - Inferior vena cava: The vessel was normal in size. The respirophasic  diameter changes were in the normal range (>= 50%), consistent with normal central venous pressure. - Pericardium, extracardiac: There was no pericardial effusion. There was a left pleural effusion. Impressions: - LVEF 60-65%, moderate LVH, normal wall motion, diastolic dysfunction, elevated LV filling pressure, aortic sclerosis with mild AI, trvial MR, normal LA size, mild TR, RVSP 32 mmHg, normal IVC, no significant pericardial effusion, left pleural effusion.  Lower extremity venous Doppler No DVT  VATS on March 24  Antibiotics: Zosyn and vancomycin will be stopped. 3/27 Unasyn 3/27  Subjective: Patient feels better this morning. Less pain in her right chest now that the chest tube has been removed. Complains of nausea with occasional episodes of vomiting. Some abdominal discomfort at times.  Objective: Vital Signs  Filed Vitals:   01/28/16 2252 01/28/16 2349 01/29/16 0000 01/29/16 0400  BP: 143/80 142/68 116/51 134/62  Pulse: 88 70 71 76  Temp:  98.7 F (37.1 C)  97.9 F (36.6 C)  TempSrc:  Oral  Oral  Resp:  20 18 20   Height:      Weight:    76 kg (167 lb 8.8 oz)  SpO2:  96% 96% 96%    Intake/Output Summary (Last 24 hours) at 01/29/16 0756 Last data filed at 01/29/16 0600  Gross per 24 hour  Intake 1249.33 ml  Output   2950 ml  Net -1700.67 ml   Filed Weights   01/27/16 0500 01/28/16 0430 01/29/16 0400  Weight: 77.8 kg (171 lb 8.3 oz) 76.8 kg (169 lb 5 oz) 76 kg (167 lb 8.8 oz)    General appearance: alert, cooperative, appears stated age and no distress Resp: Diminished air entry at the right base. Crackles  are present. No wheezing or rhonchi. Chest wall: Chest tube noted. Right chest posteriorly Cardio: regular rate and rhythm, S1, S2 normal, no murmur, click, rub or gallop GI: Abdomen is soft. Vague tenderness present diffusely without any rebound, rigidity or guarding. No masses or organomegaly. Extremities: extremities normal, atraumatic, no cyanosis or  edema Neurologic: Patient is awake and alert. Oriented 3. No cranial nerve deficits. Motor strength equal bilateral upper and lower extremities.  Lab Results:  Basic Metabolic Panel:  Recent Labs Lab 01/22/16 2203 01/23/16 0605  01/25/16 0440 01/26/16 0530 01/27/16 0505 01/28/16 0245 01/29/16 0330  NA 138 138  < > 138 140 141 139 137  K 4.2 3.4*  < > 3.4* 3.9 3.6 3.4* 3.6  CL 106 105  < > 106 107 110 110 106  CO2 20* 23  < > 22 23 22  21* 20*  GLUCOSE 181* 96  < > 96 132* 128* 98 112*  BUN 17 15  < > 8 8 8 10 10   CREATININE 1.14* 1.17*  < > 0.93 0.96 1.26* 1.24* 1.27*  CALCIUM 7.7* 7.8*  < > 7.9* 8.2* 8.0* 8.3* 8.3*  MG 1.6* 1.6*  --   --   --   --   --  2.1  PHOS 2.5 1.9*  --   --   --   --   --   --   < > = values in this interval not displayed. Liver Function Tests:  Recent Labs Lab 01/22/16 2203 01/24/16 0831 01/26/16 0530 01/27/16 0505 01/28/16 0245  AST 19 16 16 20 17   ALT 11* 10* 9* 10* 9*  ALKPHOS 77 76 66 61 63  BILITOT 0.2* 0.5 0.4 0.4 0.3  PROT 5.6* 5.8* 5.7* 5.5* 5.3*  ALBUMIN 1.9* 1.8* 1.8* 1.9* 1.8*   CBC:  Recent Labs Lab 01/25/16 0440 01/26/16 0530 01/27/16 0505 01/28/16 0245 01/29/16 0330  WBC 7.5 8.6 11.5* 15.3* 15.9*  HGB 9.1* 9.1* 9.0* 8.7* 9.4*  HCT 29.5* 29.8* 28.6* 29.1* 31.1*  MCV 86.3 87.1 88.5 89.3 87.4  PLT 366 367 365 353 348   Cardiac Enzymes:  Recent Labs Lab 01/22/16 2203  TROPONINI 0.07*    CBG:  Recent Labs Lab 01/28/16 1133 01/28/16 1714 01/28/16 2004 01/28/16 2351 01/29/16 0359  GLUCAP 153* 205* 149* 113* 99    Recent Results (from the past 240 hour(s))  MRSA PCR Screening     Status: None   Collection Time: 01/22/16  8:18 PM  Result Value Ref Range Status   MRSA by PCR NEGATIVE NEGATIVE Final    Comment:        The GeneXpert MRSA Assay (FDA approved for NASAL specimens only), is one component of a comprehensive MRSA colonization surveillance program. It is not intended to diagnose  MRSA infection nor to guide or monitor treatment for MRSA infections.   Culture, body fluid-bottle     Status: None   Collection Time: 01/23/16 12:49 PM  Result Value Ref Range Status   Specimen Description FLUID RIGHT PLEURAL  Final   Special Requests NONE  Final   Culture NO GROWTH 5 DAYS  Final   Report Status 01/28/2016 FINAL  Final  Gram stain     Status: None   Collection Time: 01/23/16 12:49 PM  Result Value Ref Range Status   Specimen Description FLUID RIGHT PLEURAL  Final   Special Requests NONE  Final   Gram Stain   Final    ABUNDANT WBC PRESENT,BOTH PMN AND MONONUCLEAR  NO ORGANISMS SEEN    Report Status 01/23/2016 FINAL  Final  Acid Fast Smear (AFB)     Status: None   Collection Time: 01/26/16  3:10 PM  Result Value Ref Range Status   AFB Specimen Processing Concentration  Final   Acid Fast Smear Negative  Final    Comment: (NOTE) Performed At: [facility] [address] [place], [place] [id number] Mila Homer MD [id number]    Source (AFB) FLUID  Final    Comment: PLEURAL RIGHT   Culture, body fluid-bottle     Status: None (Preliminary result)   Collection Time: 01/26/16  3:10 PM  Result Value Ref Range Status   Specimen Description FLUID PLEURAL RIGHT  Final   Special Requests BOTTLES DRAWN AEROBIC AND ANAEROBIC 10CC  Final   Culture NO GROWTH 2 DAYS  Final   Report Status PENDING  Incomplete  Gram stain     Status: None   Collection Time: 01/26/16  3:10 PM  Result Value Ref Range Status   Specimen Description FLUID PLEURAL RIGHT  Final   Special Requests NONE  Final   Gram Stain   Final    WBC PRESENT,BOTH PMN AND MONONUCLEAR NO ORGANISMS SEEN CYTOSPIN    Report Status 01/26/2016 FINAL  Final  C difficile quick scan w PCR reflex     Status: None   Collection Time: 01/28/16  6:20 PM  Result Value Ref Range Status   C Diff antigen NEGATIVE NEGATIVE Final   C Diff toxin NEGATIVE NEGATIVE Final   C Diff interpretation Negative for  toxigenic C. difficile  Final      Studies/Results: Dg Chest Port 1 View  01/29/2016  CLINICAL DATA:  Pneumothorax. EXAM: PORTABLE CHEST 1 VIEW COMPARISON:  01/28/2016. FINDINGS: Mediastinum hilar structures normal. Cardiomegaly with mild pulmonary vascular prominence. Low lung volumes with mild bibasilar atelectasis, slightly improved from prior study. Small right pleural effusion. No pneumothorax. Right chest wall mild subcutaneous emphysema again noted. IMPRESSION: 1.  Cardiomegaly with mild pulmonary vascular prominence. 2. Low lung volumes with mild bibasilar atelectasis. Slight improvement from prior exam. Persistent small right pleural effusion. 3. Stable mild right chest wall subcutaneous emphysema. No pneumothorax. Electronically Signed   By: Maisie Fus  Register   On: 01/29/2016 07:06   Dg Chest Port 1 View  01/28/2016  CLINICAL DATA:  Status post chest tube removal. EXAM: PORTABLE CHEST 1 VIEW COMPARISON:  01/28/2016 at 4:50 a.m. FINDINGS: Right chest tube has been removed.  There is no pneumothorax. Opacity in the right mid to lower lung is stable. Mild opacity at the left lung base is also stable. No new lung abnormalities. IMPRESSION: 1. Status post right chest tube removal. No pneumothorax. No other change from the earlier study. Electronically Signed   By: Amie Portland M.D.   On: 01/28/2016 15:47   Dg Chest Port 1 View  01/28/2016  CLINICAL DATA:  History of RIGHT chest tube with pneumothorax. EXAM: PORTABLE CHEST 1 VIEW COMPARISON:  Yesterday's radiograph. FINDINGS: Large bore thoracostomy tube on the RIGHT appears unchanged. There is no pneumothorax. Moderate fluid is seen at the RIGHT base, stable. Bibasilar subsegmental atelectasis, slightly improved, given the increased lung volumes. Cardiomegaly. IMPRESSION: Stable exam. Electronically Signed   By: Elsie Stain M.D.   On: 01/28/2016 09:56    Medications:  Scheduled: . aspirin  81 mg Oral Daily  . bisacodyl  10 mg Oral Daily  .  guaiFENesin  600 mg Oral BID  . insulin aspart  0-24 Units Subcutaneous 6 times per day  . levothyroxine  100 mcg Oral QAC breakfast  . metoprolol tartrate  25 mg Oral BID  . pantoprazole (PROTONIX) IV  40 mg Intravenous Q12H  . piperacillin-tazobactam (ZOSYN)  IV  3.375 g Intravenous Q8H  . predniSONE  10 mg Oral Q breakfast  . senna-docusate  1 tablet Oral QHS  . sodium chloride flush  10-40 mL Intracatheter Q12H  . vancomycin  1,000 mg Intravenous Q24H   Continuous: . dextrose 5 % and 0.45 % NaCl with KCl 20 mEq/L 50 mL/hr at 01/28/16 2259   WNI:[id number], fentaNYL (SUBLIMAZE) injection, ketorolac, levalbuterol, loperamide, ondansetron (ZOFRAN) IV, potassium chloride, sodium chloride flush  Assessment/Plan:  Active Problems:   Pulmonary embolism (HCC)   Pleural effusion   Pericardial effusion   Rheumatoid arthritis with positive rheumatoid factor (HCC)   Pressure ulcer   Pulmonary embolism with acute cor pulmonale (HCC)   Pleural effusion on right   Shortness of breath   Hypoxemia   DM type 2 (diabetes mellitus, type 2) (HCC)    Acute pulmonary embolism Patient is status post IVC filter placement prior to her VATS procedure. Per cardiothoracic anticoagulation could be started 1 week after her procedure which was done on 3/24. So, anticoagulation can be resumed on 3/31. Echocardiogram report as above. Patient denies any chest pain or shortness of breath.  Loculated Pleural effusion status post VATS procedure Parapneumonic vs secondary to rib fractures. Ultrasound-guidedthoracentesis yielded 50 mL of very thick bloody effusion. Consulted Dr. Laneta Simmers, who recommended repeat CT scan. This showed large loculated right pleural effusion with compressive atelectasis. Currently on empiric antibiotics Zosyn, vancomycin. Cultures are negative so far.Would need to hold anticoagulation for 1 weekpost surgery, per Dr. Laneta Simmers. Chest tube was removed 3/26. Since cultures are negative so  far, we will stop vancomycin and Zosyn and place patient on Unasyn.  Nausea and Vomiting Reason for her symptoms are not entirely clear. Could've been secondary to pain from the chest tube. Place her on PPI. Abdominal films ordered and did not show any concerning findings. Mobilize patient. Her LFTs are also normal. Continue to monitor clinically.   Pericardial effusion Patient had a 2-D echo to evaluate for pericardial effusion. Cardiology was consulted. Echo showed only a small pericardial effusion. No further workup indicated at this time per cardiology.  Rib fractures Pain control. PT and OT evaluation.  History of diabetes mellitus type 2 Continue sliding scale coverage. HbA1c 11.7.  Hypothyroidism Continue Synthroid  History of rheumatoid arthritis on Chronic steroids? Her initial home medications showed that he she was on prednisone 10 mg daily. Current home medication list does not suggest same. She does have a history of rheumatoid arthritis. Her chronic prednisone use will need to be verified with the patient.  Essential hypertension Patient was on metoprolol at home. Continue to monitor blood pressures.  Elevated creatinine Patient had good urine output yesterday after she was given a dose of Lasix. Hold off on further doses. Creatinine noted to be slightly elevated today. Monitor for now.   Normocytic anemia Hemoglobin noted to trend down slightly. No overt bleeding. Monitor closely for now. Stable  Hypokalemia This will be repleted  Stage II pressure ulcer noted to the right heel, left buttock and an unstageable pressure injury. Right lateral foot Seen by wound care. Air mattress.  DVT Prophylaxis: SCDs    Code Status: Full code  Family Communication: Discussed with the patient. No family at bedside  Disposition Plan: Okay for  transfer to floor if cleared by cardiothoracic surgery. Continue mobilizing.    LOS: 7 days   [facility]  [facility] Pager [phone] 01/29/2016, 7:56 AM  If 7PM-7AM, please contact night-coverage at [email or web address], password [facility]

## 2016-01-29 NOTE — Progress Notes (Signed)
Physical Therapy Treatment Patient Details Name: Kirsten Gallegos MRN: [id number] DOB: 12-09-1935 Today's Date: 01/29/2016    History of Present Illness 80 y.o. female with a history of CAD, RA, HTN, DM, HL and hypothyroidism who transferred from [facility] hospital 01/22/16 with PE    PT Comments    Patient seen for OOB mobility. Patient tolerated EOB ~5 minutes then tolerated transfer to [facility]. Patient reported increase nausea but did not vomit. BP assessed and stable 130s/90s HR 70s. Patient then assisted for some brief steps in room but was reluctant to mobilize further, requested back to bed. Will continue to see and progress as tolerated. Continue to feel patient would benefit from ST SNF, however, patient continues to decline.   Follow Up Recommendations  Home health PT;Supervision/Assistance - 24 hour (Pt declines any ST SNF despite recommendation)     Equipment Recommendations  None recommended by PT    Recommendations for Other Services       Precautions / Restrictions Precautions Precautions: Fall Restrictions Weight Bearing Restrictions: No    Mobility  Bed Mobility Overal bed mobility: Needs Assistance Bed Mobility: Rolling;Sidelying to Sit;Sit to Supine Rolling: Min guard Sidelying to sit: Min assist   Sit to supine: Mod assist   General bed mobility comments: Min assist to power up to EOB, increased time and effort noted to perform. Moderate assist to return to supine required BLE elevation and trunk support  Transfers Overall transfer level: Needs assistance Equipment used:  (Back of chair for UE support) Transfers: Sit to/from [employer] Sit to Stand: Min assist;+2 physical assistance Stand pivot transfers: Min assist;+2 physical assistance       General transfer comment: Mis assist for shuffling steps to bedside commode. Increased time to perform, +2 assist for stability. Patient with increased anxiety and reports nausea with  movement  Ambulation/Gait Ambulation/Gait assistance: Mod assist Ambulation Distance (Feet): 6 Feet Assistive device: 1 person hand held assist Gait Pattern/deviations: Shuffle;Step-to pattern     General Gait Details: very limited activity tolerance, reports she could not walk more then a few shuffling steps because she did not have shoes on   Stairs            Wheelchair Mobility    Modified Rankin (Stroke Patients Only)       Balance Overall balance assessment: History of Falls                                  Cognition Arousal/Alertness: Awake/alert Behavior During Therapy: Flat affect Overall Cognitive Status: Within Functional Limits for tasks assessed                      Exercises      General Comments        Pertinent Vitals/Pain Pain Assessment: Faces Faces Pain Scale: Hurts even more Pain Location: patient reports pain in back, hands and feet  Pain Descriptors / Indicators: Sore    Home Living Family/patient expects to be discharged to:: Private residence Living Arrangements: Children Available Help at Discharge: Family Type of Home: House     Home Layout: One level Home Equipment: Environmental consultant - 2 wheels      Prior Function Level of Independence: Needs assistance  Gait / Transfers Assistance Needed: supervision, utilized RW for ambulation   Comments: Son provided assist for activities secondary to patient co-morbidities and RA   PT Goals (current goals can now be  found in the care plan section) Acute Rehab PT Goals Patient Stated Goal: to go home PT Goal Formulation: With patient Time For Goal Achievement: 02/08/16 Potential to Achieve Goals: Good Progress towards PT goals: Progressing toward goals    Frequency  Min 3X/week    PT Plan Current plan remains appropriate    Co-evaluation             End of Session Equipment Utilized During Treatment: Gait belt Activity Tolerance: Treatment limited secondary  to medical complications (Comment) (nausea and dizziness) Patient left: with bed alarm set;in bed;with call bell/phone within reach     Time: 3612-2449 PT Time Calculation (min) (ACUTE ONLY): 19 min  Charges:  $Therapeutic Activity: 8-22 mins                    G CodesFabio Asa 02/14/2016, 3:37 PM Charlotte Crumb, PT DPT  [phone]

## 2016-01-29 NOTE — Progress Notes (Signed)
Pharmacy Antibiotic Note  Kirsten Gallegos is a 80 y.o. female with possible HCAP .  Patient currently on day 7 of vancomycin and zosyn. Abx narrowed to unasyn this morning.  No fevers overnight, wbc 15.9, scr ok at 1.2.  Plan: -Change to unasyn 3g IV q8 hours  Height: 5\' 6"  (167.6 cm) Weight: 167 lb 8.8 oz (76 kg) IBW/kg (Calculated) : 59.3  Temp (24hrs), Avg:98.3 F (36.8 C), Min:97.9 F (36.6 C), Max:98.7 F (37.1 C)   Recent Labs Lab 01/22/16 2139  01/25/16 0440 01/26/16 0530 01/27/16 0505 01/27/16 1920 01/28/16 0245 01/29/16 0330  WBC  --   < > 7.5 8.6 11.5*  --  15.3* 15.9*  CREATININE  --   < > 0.93 0.96 1.26*  --  1.24* 1.27*  LATICACIDVEN 1.0  --   --   --   --   --   --   --   VANCOTROUGH  --   --   --   --   --  29*  --   --   < > = values in this interval not displayed.  Estimated Creatinine Clearance: 38.7 mL/min (by C-G formula based on Cr of 1.27).    Allergies  Allergen Reactions  . Cephalexin Other (See Comments)    Pt states since she had meningitis, she not able to take antibiotic, but did not give reactions.   . Codeine Nausea And Vomiting    Antimicrobials this admission: Vanc 3/20>> 3/27    Vancomycin level= 29 on 750mg  IV q12h Zosyn 3/20>>3/27 Unasyn 3/27>>  Microbiology results: 3/24 pleural fluid- ngtd 3/20 MRSA CPR- neg  Thank you for allowing pharmacy to be a part of this patient's care.  4/24 PharmD., BCPS Clinical Pharmacist Pager [phone] 01/29/2016 12:09 PM

## 2016-01-30 ENCOUNTER — Inpatient Hospital Stay (HOSPITAL_COMMUNITY): Payer: Medicare Other

## 2016-01-30 LAB — COMPREHENSIVE METABOLIC PANEL
ALT: 10 U/L — ABNORMAL LOW (ref 14–54)
AST: 19 U/L (ref 15–41)
Albumin: 1.8 g/dL — ABNORMAL LOW (ref 3.5–5.0)
Alkaline Phosphatase: 71 U/L (ref 38–126)
Anion gap: 7 (ref 5–15)
BUN: 10 mg/dL (ref 6–20)
CO2: 20 mmol/L — ABNORMAL LOW (ref 22–32)
Calcium: 8.2 mg/dL — ABNORMAL LOW (ref 8.9–10.3)
Chloride: 110 mmol/L (ref 101–111)
Creatinine, Ser: 1.21 mg/dL — ABNORMAL HIGH (ref 0.44–1.00)
GFR calc Af Amer: 49 mL/min — ABNORMAL LOW (ref >60)
GFR calc non Af Amer: 42 mL/min — ABNORMAL LOW (ref >60)
Glucose, Bld: 133 mg/dL — ABNORMAL HIGH (ref 65–99)
Potassium: 4.5 mmol/L (ref 3.5–5.1)
Sodium: 137 mmol/L (ref 135–145)
Total Bilirubin: 0.4 mg/dL (ref 0.3–1.2)
Total Protein: 5.4 g/dL — ABNORMAL LOW (ref 6.5–8.1)

## 2016-01-30 LAB — CBC
HCT: 27 % — ABNORMAL LOW (ref 36.0–46.0)
Hemoglobin: 8.6 g/dL — ABNORMAL LOW (ref 12.0–15.0)
MCH: 28.2 pg (ref 26.0–34.0)
MCHC: 31.9 g/dL (ref 30.0–36.0)
MCV: 88.5 fL (ref 78.0–100.0)
PLATELETS: 293 10*3/uL (ref 150–400)
RBC: 3.05 MIL/uL — ABNORMAL LOW (ref 3.87–5.11)
RDW: 16.4 % — AB (ref 11.5–15.5)
WBC: 11.9 10*3/uL — ABNORMAL HIGH (ref 4.0–10.5)

## 2016-01-30 LAB — GLUCOSE, CAPILLARY
GLUCOSE-CAPILLARY: 134 mg/dL — AB (ref 65–99)
GLUCOSE-CAPILLARY: 237 mg/dL — AB (ref 65–99)
Glucose-Capillary: 166 mg/dL — ABNORMAL HIGH (ref 65–99)

## 2016-01-30 MED ORDER — KCL IN DEXTROSE-NACL 20-5-0.45 MEQ/L-%-% IV SOLN
INTRAVENOUS | Status: DC
  Administered 2016-01-31: 02:00:00 via INTRAVENOUS
  Filled 2016-01-30: qty 1000

## 2016-01-30 MED ORDER — ZOLPIDEM TARTRATE 5 MG PO TABS
5.0000 mg | ORAL_TABLET | Freq: Every evening | ORAL | Status: DC | PRN
  Administered 2016-01-30 – 2016-02-01 (×3): 5 mg via ORAL
  Filled 2016-01-30 (×3): qty 1

## 2016-01-30 MED ORDER — PANTOPRAZOLE SODIUM 40 MG PO TBEC
40.0000 mg | DELAYED_RELEASE_TABLET | Freq: Two times a day (BID) | ORAL | Status: DC
  Administered 2016-01-30 – 2016-02-02 (×7): 40 mg via ORAL
  Filled 2016-01-30 (×7): qty 1

## 2016-01-30 NOTE — Progress Notes (Signed)
[facility] PROGRESS NOTE  Kirsten Gallegos UEA:[id number] DOB: 30-Sep-1936 DOA: 01/22/2016  PCP: Quentin Mulling, MD  Brief HPI: 80 year-old female who presented to an outside hospital with shortness of breath or fever. She had a nonproductive cough at that time. She is noted to have some chest pain on the right. Her son brought her to the emergency department. In the emergency room she had a right lower lobe pulmonary embolism noted on CT chest question of RV strain. Follow-up echocardiogram showed no evidence of RV strain. She was transferred to [facility] for further evaluation. Patient underwent VATS on 3/24 for right loculated pleural effusion. Cardiothoracic surgery recommends to hold anticoagulation for 1 week prior to resuming it. Consider NOAC when it is safe to start anticoagulation  Past medical history:  Past Medical History  Diagnosis Date  . Reflux   . Hypertension   . Rheumatoid arthritis (HCC)   . Heart attack [facility])     Consultants: Cardiothoracic surgery. Cardiology. Critical care medicine.  Procedures:  Echocardiogram Study Conclusions - Left ventricle: The cavity size was normal. Wall thickness was increased in a pattern of moderate LVH. Systolic function was normal. The estimated ejection fraction was in the range of 60% to 65%. Wall motion was normal; there were no regional wall motion abnormalities. Doppler parameters are consistent with abnormal left ventricular relaxation (grade 1 diastolic dysfunction). The E/e&' ratio is >15, suggesting elevated LV Filling pressure. - Aortic valve: Sclerosis without stenosis. There was mild regurgitation. - Mitral valve: Calcified annulus. Mildly thickened leaflets . There was trivial regurgitation. - Left atrium: The atrium was normal in size. - Tricuspid valve: There was mild regurgitation. - Pulmonary arteries: PA peak pressure: 32 mm Hg (S). - Inferior vena cava: The vessel was normal in size. The respirophasic  diameter changes were in the normal range (>= 50%), consistent with normal central venous pressure. - Pericardium, extracardiac: There was no pericardial effusion. There was a left pleural effusion. Impressions: - LVEF 60-65%, moderate LVH, normal wall motion, diastolic dysfunction, elevated LV filling pressure, aortic sclerosis with mild AI, trvial MR, normal LA size, mild TR, RVSP 32 mmHg, normal IVC, no significant pericardial effusion, left pleural effusion.  Lower extremity venous Doppler No DVT  VATS on March 24 Chest tube was placed and was removed on March 26  Antibiotics: Zosyn and vancomycin will be stopped. 3/27 Unasyn 3/27  Subjective: Patient continues to feel better. Her nausea is improved. She is tolerating some diet. Denies any abdominal pain. Overall, feels better.  Objective: Vital Signs  Filed Vitals:   01/30/16 0030 01/30/16 0411 01/30/16 0800 01/30/16 1157  BP: 142/68 146/62 153/67 155/67  Pulse: 72 73 73 69  Temp: 98.2 F (36.8 C) 97.9 F (36.6 C) 98 F (36.7 C) 98.1 F (36.7 C)  TempSrc: Oral Oral Oral Oral  Resp: 19 19 19 21   Height:      Weight:  75.8 kg (167 lb 1.7 oz)    SpO2: 100% 97% 96% 98%    Intake/Output Summary (Last 24 hours) at 01/30/16 1211 Last data filed at 01/30/16 1023  Gross per 24 hour  Intake   1590 ml  Output   3025 ml  Net  -1435 ml   Filed Weights   01/28/16 0430 01/29/16 0400 01/30/16 0411  Weight: 76.8 kg (169 lb 5 oz) 76 kg (167 lb 8.8 oz) 75.8 kg (167 lb 1.7 oz)    General appearance: alert, cooperative, appears stated age and no distress Resp:  Continues to have Diminished air entry at the right base. But overall air entry has improved. No wheezing or rhonchi. Chest wall: Chest tube noted. Right chest posteriorly Cardio: regular rate and rhythm, S1, S2 normal, no murmur, click, rub or gallop GI: Abdomen is soft. Nontender today. No masses or organomegaly. Extremities: extremities normal, atraumatic, no cyanosis  or edema Neurologic: Patient is awake and alert. Oriented 3. No cranial nerve deficits. Motor strength equal bilateral upper and lower extremities.  Lab Results:  Basic Metabolic Panel:  Recent Labs Lab 01/26/16 0530 01/27/16 0505 01/28/16 0245 01/29/16 0330 01/30/16 0324  NA 140 141 139 137 137  K 3.9 3.6 3.4* 3.6 4.5  CL 107 110 110 106 110  CO2 23 22 21* 20* 20*  GLUCOSE 132* 128* 98 112* 133*  BUN 8 8 10 10 10   CREATININE 0.96 1.26* 1.24* 1.27* 1.21*  CALCIUM 8.2* 8.0* 8.3* 8.3* 8.2*  MG  --   --   --  2.1  --    Liver Function Tests:  Recent Labs Lab 01/26/16 0530 01/27/16 0505 01/28/16 0245 01/29/16 0330 01/30/16 0324  AST 16 20 17 20 19   ALT 9* 10* 9* 11* 10*  ALKPHOS 66 61 63 81 71  BILITOT 0.4 0.4 0.3 0.3 0.4  PROT 5.7* 5.5* 5.3* 5.8* 5.4*  ALBUMIN 1.8* 1.9* 1.8* 1.9* 1.8*   CBC:  Recent Labs Lab 01/26/16 0530 01/27/16 0505 01/28/16 0245 01/29/16 0330 01/30/16 0324  WBC 8.6 11.5* 15.3* 15.9* 11.9*  HGB 9.1* 9.0* 8.7* 9.4* 8.6*  HCT 29.8* 28.6* 29.1* 31.1* 27.0*  MCV 87.1 88.5 89.3 87.4 88.5  PLT 367 365 353 348 293    CBG:  Recent Labs Lab 01/29/16 0809 01/29/16 1138 01/29/16 1636 01/29/16 1957 01/30/16 0821  GLUCAP 126* 164* 230* 112* 134*    Recent Results (from the past 240 hour(s))  MRSA PCR Screening     Status: None   Collection Time: 01/22/16  8:18 PM  Result Value Ref Range Status   MRSA by PCR NEGATIVE NEGATIVE Final    Comment:        The GeneXpert MRSA Assay (FDA approved for NASAL specimens only), is one component of a comprehensive MRSA colonization surveillance program. It is not intended to diagnose MRSA infection nor to guide or monitor treatment for MRSA infections.   Culture, body fluid-bottle     Status: None   Collection Time: 01/23/16 12:49 PM  Result Value Ref Range Status   Specimen Description FLUID RIGHT PLEURAL  Final   Special Requests NONE  Final   Culture NO GROWTH 5 DAYS  Final   Report  Status 01/28/2016 FINAL  Final  Gram stain     Status: None   Collection Time: 01/23/16 12:49 PM  Result Value Ref Range Status   Specimen Description FLUID RIGHT PLEURAL  Final   Special Requests NONE  Final   Gram Stain   Final    ABUNDANT WBC PRESENT,BOTH PMN AND MONONUCLEAR NO ORGANISMS SEEN    Report Status 01/23/2016 FINAL  Final  Acid Fast Smear (AFB)     Status: None   Collection Time: 01/26/16  3:10 PM  Result Value Ref Range Status   AFB Specimen Processing Concentration  Final   Acid Fast Smear Negative  Final    Comment: (NOTE) Performed At: [facility] [address] [place], [place] [id number] Mila Homer MD WU:[id number]    Source (AFB) FLUID  Final    Comment: PLEURAL  RIGHT   Culture, body fluid-bottle     Status: None (Preliminary result)   Collection Time: 01/26/16  3:10 PM  Result Value Ref Range Status   Specimen Description FLUID PLEURAL RIGHT  Final   Special Requests BOTTLES DRAWN AEROBIC AND ANAEROBIC 10CC  Final   Culture NO GROWTH 4 DAYS  Final   Report Status PENDING  Incomplete  Gram stain     Status: None   Collection Time: 01/26/16  3:10 PM  Result Value Ref Range Status   Specimen Description FLUID PLEURAL RIGHT  Final   Special Requests NONE  Final   Gram Stain   Final    WBC PRESENT,BOTH PMN AND MONONUCLEAR NO ORGANISMS SEEN CYTOSPIN    Report Status 01/26/2016 FINAL  Final  C difficile quick scan w PCR reflex     Status: None   Collection Time: 01/28/16  6:20 PM  Result Value Ref Range Status   C Diff antigen NEGATIVE NEGATIVE Final   C Diff toxin NEGATIVE NEGATIVE Final   C Diff interpretation Negative for toxigenic C. difficile  Final      Studies/Results: Dg Chest Port 1 View  01/30/2016  CLINICAL DATA:  Video-assisted thoracotomy, pleural and pericardial effusion, pulmonary embolism and cor pulmonale. EXAM: PORTABLE CHEST 1 VIEW COMPARISON:  Portable chest x-ray of January 29, 2016 FINDINGS: The left lung is  well-expanded and clear. On the right there is persistent alveolar opacity in the mid and lower lung and perihilar region with small right pleural effusion. No pneumothorax is evident. The cardiac silhouette is enlarged. The pulmonary vascularity is mildly prominent centrally but there is no cephalization of the vascular pattern. IMPRESSION: Slight interval improvement in the appearance of the chest with nearly normal left lung. Persistent pleural effusion and subsegmental atelectasis in the right mid and lower lung. Stable cardiomegaly without significant pulmonary vascular congestion. Electronically Signed   By: David  [place] M.D.   On: 01/30/2016 07:30   Dg Chest Port 1 View  01/29/2016  CLINICAL DATA:  Pneumothorax. EXAM: PORTABLE CHEST 1 VIEW COMPARISON:  01/28/2016. FINDINGS: Mediastinum hilar structures normal. Cardiomegaly with mild pulmonary vascular prominence. Low lung volumes with mild bibasilar atelectasis, slightly improved from prior study. Small right pleural effusion. No pneumothorax. Right chest wall mild subcutaneous emphysema again noted. IMPRESSION: 1.  Cardiomegaly with mild pulmonary vascular prominence. 2. Low lung volumes with mild bibasilar atelectasis. Slight improvement from prior exam. Persistent small right pleural effusion. 3. Stable mild right chest wall subcutaneous emphysema. No pneumothorax. Electronically Signed   By: Maisie Fus  Register   On: 01/29/2016 07:06   Dg Chest Port 1 View  01/28/2016  CLINICAL DATA:  Status post chest tube removal. EXAM: PORTABLE CHEST 1 VIEW COMPARISON:  01/28/2016 at 4:50 a.m. FINDINGS: Right chest tube has been removed.  There is no pneumothorax. Opacity in the right mid to lower lung is stable. Mild opacity at the left lung base is also stable. No new lung abnormalities. IMPRESSION: 1. Status post right chest tube removal. No pneumothorax. No other change from the earlier study. Electronically Signed   By: Amie Portland M.D.   On: 01/28/2016  15:47   Dg Abd 2 Views  01/29/2016  CLINICAL DATA:  Abdominal pain for 1 week EXAM: ABDOMEN - 2 VIEW COMPARISON:  None. FINDINGS: Prior cholecystectomy and left hip replacement. IVC filter in place. Nonobstructive bowel gas pattern. No free air organomegaly. No suspicious calcification. No acute bony abnormality. IMPRESSION: No acute findings. Electronically Signed  By: Charlett Nose M.D.   On: 01/29/2016 09:20    Medications:  Scheduled: . ampicillin-sulbactam (UNASYN) IV  3 g Intravenous Q8H  . aspirin  81 mg Oral Daily  . bisacodyl  10 mg Oral Daily  . guaiFENesin  600 mg Oral BID  . insulin aspart  0-24 Units Subcutaneous TID WC  . levothyroxine  100 mcg Oral QAC breakfast  . metoprolol tartrate  25 mg Oral BID  . pantoprazole  40 mg Oral BID  . predniSONE  10 mg Oral Q breakfast  . senna-docusate  1 tablet Oral QHS   Continuous: . dextrose 5 % and 0.45 % NaCl with KCl 20 mEq/L 50 mL/hr at 01/29/16 1754   OZH:YQMVHQIONGEXB, fentaNYL (SUBLIMAZE) injection, HYDROcodone-acetaminophen, levalbuterol, loperamide, ondansetron (ZOFRAN) IV, potassium chloride  Assessment/Plan:  Active Problems:   Pulmonary embolism (HCC)   Pleural effusion   Pericardial effusion   Rheumatoid arthritis with positive rheumatoid factor (HCC)   Pressure ulcer   Pulmonary embolism with acute cor pulmonale (HCC)   Pleural effusion on right   Shortness of breath   Hypoxemia   DM type 2 (diabetes mellitus, type 2) (HCC)   Emesis    Acute pulmonary embolism Patient is status post IVC filter placement prior to her VATS procedure. Per cardiothoracic anticoagulation could be started 1 week after her procedure which was done on 3/24. So, anticoagulation can be resumed on 3/31. This was confirmed with Dr. Sharee Pimple PA today. Echocardiogram report as above. Patient denies any chest pain or shortness of breath.  Loculated Pleural effusion status post VATS procedure Parapneumonic vs secondary to rib fractures.  Ultrasound-guidedthoracentesis yielded 50 mL of very thick bloody effusion. Consulted Dr. Laneta Simmers, who recommended repeat CT scan. This showed large loculated right pleural effusion with compressive atelectasis. Currently on empiric antibiotics Zosyn, vancomycin. Cultures are negative so far.Would need to hold anticoagulation for 1 weekpost surgery, per Dr. Laneta Simmers. Chest tube was removed 3/26. Cultures were all negative. Patient was changed over to Unasyn from vancomycin and Zosyn. As her nausea improves and her WBC improves, she can be changed over to oral Augmentin in the next day or so.   Nausea and Vomiting She is much improved. Reason for her symptoms are not entirely clear. Could've been secondary to pain from the chest tube. Continue PPI. Abdominal films ordered and did not show any concerning findings. Mobilize patient. Her LFTs are also normal. Continue to monitor clinically.   Elevated creatinine Patient had good urine output after she was given a dose of Lasix on March 26. Holding off on further doses, although she may need to be placed back on her usual oral regimen from tomorrow. Creatinine is stable.   Pericardial effusion Patient had a 2-D echo to evaluate for pericardial effusion. Cardiology was consulted. Echo showed only a small pericardial effusion. No further workup indicated at this time per cardiology.  Rib fractures Pain control. PT and OT evaluation.  History of diabetes mellitus type 2 Continue sliding scale coverage. HbA1c 11.7.  Hypothyroidism Continue Synthroid  History of rheumatoid arthritis on Chronic steroids Confirmed with the patient and she is indeed on prednisone at 10 mg daily. She has been taking this for 3 years.   Essential hypertension Patient was on metoprolol at home. Continue to monitor blood pressures. Blood pressure noted to be rising slowly. Consider reinitiating metoprolol soon.  Normocytic anemia Hemoglobin is low but stable. No overt  bleeding. Monitor closely for now. Stable  Hypokalemia Repleted  Stage II  pressure ulcer noted to the right heel, left buttock and an unstageable pressure injury. Right lateral foot Seen by wound care. Air mattress.  DVT Prophylaxis: SCDs    Code Status: Full code  Family Communication: Discussed with the patient. No family at bedside  Disposition Plan: Okay for transfer to floor. Continue mobilizing.    LOS: 8 days   [facility]  [facility] Pager [phone] 01/30/2016, 12:11 PM  If 7PM-7AM, please contact night-coverage at [email or web address], password [facility]

## 2016-01-30 NOTE — Progress Notes (Signed)
Physical Therapy Treatment Patient Details Name: Kirsten Gallegos MRN: [id number] DOB: [birth year] Today's Date: 01/30/2016    History of Present Illness 80 y.o. female with a history of CAD, RA, HTN, DM, HL and hypothyroidism who transferred from [facility] hospital 01/22/16 with PE    PT Comments    Patient making some modest progress towards PT goals. Tolerated some in room ambulation, attempted mobility to hall but patient very anxious and reluctant. At this time continue to recommend ST SNF but patient declines. Will continue to see and progress as tolerated.   Follow Up Recommendations  Home health PT;Supervision/Assistance - 24 hour (Pt declines any ST SNF despite recommendation)     Equipment Recommendations  None recommended by PT    Recommendations for Other Services       Precautions / Restrictions Precautions Precautions: Fall Restrictions Weight Bearing Restrictions: No    Mobility  Bed Mobility Overal bed mobility: Needs Assistance Bed Mobility: Supine to Sit     Supine to sit: Min assist     General bed mobility comments: assist for raising trunk and to scoot hips toward EOB, encouraged use of rail, but pt preferring to pull up on therapists hand  Transfers Overall transfer level: Needs assistance Equipment used: Rolling walker (2 wheeled) Transfers: Sit to/from Stand Sit to Stand: Min assist;+2 physical assistance         General transfer comment: Min assist to power up to standing secondary to poor ability to push through hands to elevate trunk.   Ambulation/Gait Ambulation/Gait assistance: Min guard Ambulation Distance (Feet): 20 Feet Assistive device: Rolling walker (2 wheeled) Gait Pattern/deviations: Shuffle;Step-to pattern     General Gait Details: increased anxiety with mobility. Tolerated ambulation to the hall but became very anxious and required max encouragment to mobilize further. VSS   Stairs            Wheelchair Mobility    Modified Rankin (Stroke Patients Only)       Balance Overall balance assessment: Needs assistance Sitting-balance support: Feet supported Sitting balance-Leahy Scale: Fair       Standing balance-Leahy Scale: Poor Standing balance comment: can release walker and perform pericare with min assist                    Cognition Arousal/Alertness: Awake/alert Behavior During Therapy: Anxious Overall Cognitive Status: Within Functional Limits for tasks assessed                      Exercises      General Comments        Pertinent Vitals/Pain Pain Assessment: No/denies pain    Home Living Family/patient expects to be discharged to:: Private residence Living Arrangements: Children (son) Available Help at Discharge: Family Type of Home: House     Home Layout: One level Home Equipment: Environmental consultant - 2 wheels Additional Comments: son is going to put up grab bars in shower    Prior Function Level of Independence: Needs assistance  Gait / Transfers Assistance Needed: supervision, utilized RW for ambulation ADL's / Homemaking Assistance Needed: son does cooking and cleaning and assists with LB dressing     PT Goals (current goals can now be found in the care plan section) Acute Rehab PT Goals Patient Stated Goal: to go home PT Goal Formulation: With patient Time For Goal Achievement: 02/08/16 Potential to Achieve Goals: Good Progress towards PT goals: Progressing toward goals    Frequency  Min 3X/week    PT Plan  Current plan remains appropriate    Co-evaluation PT/OT/SLP Co-Evaluation/Treatment: Yes Reason for Co-Treatment: For patient/therapist safety (Dove tailed for progression) PT goals addressed during session: Mobility/safety with mobility OT goals addressed during session: ADL's and self-care     End of Session Equipment Utilized During Treatment: Gait belt Activity Tolerance: Treatment limited secondary to medical complications (Comment)  (nausea and dizziness) Patient left: in chair;with call bell/phone within reach     Time: 1028-1105 PT Time Calculation (min) (ACUTE ONLY): 37 min  Charges:  $Therapeutic Activity: 8-22 mins                    G CodesFabio Asa 01/31/16, 11:23 AM Charlotte Crumb, PT DPT  [phone]

## 2016-01-30 NOTE — Progress Notes (Signed)
Attempted to call report to unit 5 [place], nurse off the unit at this time. Will call back shortly.  Pt will transfer to room 5W19.

## 2016-01-30 NOTE — Care Management Note (Signed)
Case Management Note  Patient Details  Name: Kirsten Gallegos MRN: [id number] Date of Birth: [birth year]  Subjective/Objective:      Adm w pul emb, had thoracotomy             Action/Plan: lives w fam, has rw, does not want any other eq   Expected Discharge Date:                  Expected Discharge Plan:  Home w Home Health Services  In-House Referral:  Clinical Social Work  Discharge planning Services  CM Consult  Post Acute Care Choice:  Home Health Choice offered to:  Patient  DME Arranged:    DME Agency:     HH Arranged:  PT HH Agency:  [facility]  Status of Service:     Medicare Important Message Given:  Yes Date Medicare IM Given:    Medicare IM give by:    Date Additional Medicare IM Given:    Additional Medicare Important Message give by:     If discussed at Long Length of Stay Meetings, dates discussed:    Additional Comments: pt lives in [place] [facility] and will use them again for hhpt. Alerted tiffany that will need hhpt at disch. Will cont to follow.  [name], RN 01/30/2016, 10:27 AM

## 2016-01-30 NOTE — Evaluation (Signed)
Occupational Therapy Evaluation Patient Details Name: Kirsten Gallegos MRN: [id number] DOB: [birth year] Today's Date: 01/30/2016    History of Present Illness 80 y.o. female with a history of CAD, RA, HTN, DM, HL and hypothyroidism who transferred from [facility] hospital 01/22/16 with PE   Clinical Impression   Pt was able to shower in standing and toilet independently prior to admission. She was assisted for LB dressing and all IADL. Pt presents with anxiety in standing and with ambulation.  She requires min to max assist for ADL.  She needs encouragement to maximally participate, prefers rely on assistance. Will follow acutely.    Follow Up Recommendations  SNF;Supervision/Assistance - 24 hour (HHOT if pt refuses SNF)    Equipment Recommendations       Recommendations for Other Services       Precautions / Restrictions Precautions Precautions: Fall Restrictions Weight Bearing Restrictions: No      Mobility Bed Mobility Overal bed mobility: Needs Assistance Bed Mobility: Supine to Sit     Supine to sit: Min assist     General bed mobility comments: assist for raising trunk and to scoot hips toward EOB, encouraged use of rail, but pt preferring to pull up on therapists hand  Transfers Overall transfer level: Needs assistance Equipment used: Rolling walker (2 wheeled) Transfers: Sit to/from Stand Sit to Stand: Min assist;+2 physical assistance         General transfer comment: Min assist to power up to standing secondary to poor ability to push through hands to elevate trunk.     Balance Overall balance assessment: Needs assistance Sitting-balance support: Feet supported Sitting balance-Leahy Scale: Fair       Standing balance-Leahy Scale: Poor Standing balance comment: can release walker and perform pericare with min assist                            ADL Overall ADL's : Needs assistance/impaired Eating/Feeding: Set up;Sitting   Grooming:  Wash/dry hands;Brushing hair;Sitting;Minimal assistance   Upper Body Bathing: Minimal assitance;Sitting   Lower Body Bathing: Total assistance;Sit to/from stand   Upper Body Dressing : Minimal assistance;Sitting   Lower Body Dressing: Total assistance;Sit to/from stand   Toilet Transfer: +2 for physical assistance;Minimal assistance;RW;BSC;Ambulation   Toileting- Clothing Manipulation and Hygiene: Maximal assistance;Sit to/from stand Toileting - Clothing Manipulation Details (indicate cue type and reason): pt performed anterior pericare, assist for posterior     Functional mobility during ADLs: +2 for safety/equipment;Minimal assistance;Rolling walker (chair following)       Vision     Perception     Praxis      Pertinent Vitals/Pain Pain Assessment: No/denies pain     Hand Dominance Right   Extremity/Trunk Assessment Upper Extremity Assessment Upper Extremity Assessment: Generalized weakness (arthritic changes in hands)   Lower Extremity Assessment Lower Extremity Assessment: Defer to PT evaluation       Communication Communication Communication: HOH   Cognition Arousal/Alertness: Awake/alert Behavior During Therapy: Anxious Overall Cognitive Status: Within Functional Limits for tasks assessed                     General Comments       Exercises       Shoulder Instructions      Home Living Family/patient expects to be discharged to:: Private residence Living Arrangements: Children (son) Available Help at Discharge: Family Type of Home: House       Home Layout: One level  Bathroom Shower/Tub: Producer, television/film/video: Handicapped height     Home Equipment: Environmental consultant - 2 wheels   Additional Comments: son is going to put up grab bars in shower      Prior Functioning/Environment Level of Independence: Needs assistance  Gait / Transfers Assistance Needed: supervision, utilized RW for ambulation ADL's / Homemaking Assistance  Needed: son does cooking and cleaning and assists with LB dressing        OT Diagnosis: Generalized weakness   OT Problem List: Decreased strength;Decreased activity tolerance;Impaired balance (sitting and/or standing);Decreased knowledge of use of DME or AE;Decreased safety awareness;Impaired UE functional use   OT Treatment/Interventions: Self-care/ADL training;Energy conservation;DME and/or AE instruction;Therapeutic activities;Patient/family education;Balance training    OT Goals(Current goals can be found in the care plan section) Acute Rehab OT Goals Patient Stated Goal: to go home OT Goal Formulation: With patient Time For Goal Achievement: 02/13/16 Potential to Achieve Goals: Good ADL Goals Pt Will Perform Grooming: with set-up;sitting Pt Will Perform Upper Body Bathing: with set-up;sitting Pt Will Perform Lower Body Bathing: with min assist;sit to/from stand Pt Will Perform Upper Body Dressing: with set-up;sitting Pt Will Perform Lower Body Dressing: with min assist;sit to/from stand Pt Will Transfer to Toilet: with min assist;ambulating;bedside commode Pt Will Perform Toileting - Clothing Manipulation and hygiene: with min assist;sit to/from stand Pt Will Perform Tub/Shower Transfer: with min guard assist;ambulating;shower seat;rolling walker  OT Frequency: Min 2X/week   Barriers to D/C:            Co-evaluation PT/OT/SLP Co-Evaluation/Treatment: Yes Reason for Co-Treatment: For patient/therapist safety (Dove tailed for progression) PT goals addressed during session: Mobility/safety with mobility OT goals addressed during session: ADL's and self-care      End of Session Equipment Utilized During Treatment: Gait belt;Rolling walker Nurse Communication: Mobility status  Activity Tolerance: Other (comment) (pt is self limitind due to anxiety) Patient left: in chair;with call bell/phone within reach   Time: 1027-1104 OT Time Calculation (min): 37 min Charges:  OT  General Charges $OT Visit: 1 Procedure OT Evaluation $OT Eval Moderate Complexity: 1 Procedure OT Treatments $Self Care/Home Management : 8-22 mins G-Codes:    Evern Bio 01/30/2016, 11:19 AM  [phone]

## 2016-01-30 NOTE — Progress Notes (Signed)
Report given to RN April, pt will transfer via bed with her belongings, will assist pt in calling her family with new room number.

## 2016-01-30 NOTE — Progress Notes (Addendum)
      [address]       Kirsten Gallegos [address]             [phone]       4 Days Post-Op Procedure(s) (LRB): VIDEO ASSISTED THORACOSCOPY (VATS) for drainage of loculated right pleural effusion (Right)  Subjective: Patient hungry, awaiting lunch. She is not sleeping well .  Objective: Vital signs in last 24 hours: Temp:  [97.9 F (36.6 C)-98.4 F (36.9 C)] 98.1 F (36.7 C) (03/28 1157) Pulse Rate:  [69-73] 69 (03/28 1157) Cardiac Rhythm:  [-] Normal sinus rhythm (03/28 0840) Resp:  [17-21] 21 (03/28 1157) BP: (140-155)/(61-68) 155/67 mmHg (03/28 1157) SpO2:  [96 %-100 %] 98 % (03/28 1157) Weight:  [167 lb 1.7 oz (75.8 kg)] 167 lb 1.7 oz (75.8 kg) (03/28 0411)     Intake/Output from previous day: 03/27 0701 - 03/28 0700 In: 2350 [P.O.:500; I.V.:1200; IV Piggyback:650] Out: 1075 [Urine:1075]   Physical Exam:  Cardiovascular: RRR Pulmonary: Slightly decreased at bases R>L; no rales, wheezes, or rhonchi. Abdomen: Soft, non tender, bowel sounds present. Extremities: SCDs in place Wounds: Clean and dry.  No erythema or signs of infection.   Lab Results: CBC:  Recent Labs  01/29/16 0330 01/30/16 0324  WBC 15.9* 11.9*  HGB 9.4* 8.6*  HCT 31.1* 27.0*  PLT 348 293   BMET:   Recent Labs  01/29/16 0330 01/30/16 0324  NA 137 137  K 3.6 4.5  CL 106 110  CO2 20* 20*  GLUCOSE 112* 133*  BUN 10 10  CREATININE 1.27* 1.21*  CALCIUM 8.3* 8.2*    PT/INR: No results for input(s): LABPROT, INR in the last 72 hours. ABG:  INR: Will add last result for INR, ABG once components are confirmed Will add last 4 CBG results once components are confirmed  Assessment/Plan:  1. CV - SR. On Lopressor 25 mg bid. 2.  Pulmonary - Chest tube removed yesterday. CXR shows cardiomegaly,  low lung volumes, no pneumothorax, small right pleural effusion. Encourage incentive spirometer and flutter valve.   Encourage incentive spirometer and flutter valve 3.  Anemia-H and H decreased to 8.6 and 27 5.ID-WBC decreased to 11,900. On empiric Unasyn. OR pleural fluid cultures show no growth to date. 6. PE-per Dr. Laneta Simmers, ok to start anticoagulation 7. Will arrange for follow up appointment   ZIMMERMAN,DONIELLE MPA-C 01/30/2016,12:35 PM    Chart reviewed, patient examined, agree with above. CXR looks stable. I expect the density at the right base to resolve with time. It is due to pleural thickening and atelectasis, probably small amount of residual pleural fluid.

## 2016-01-30 NOTE — Progress Notes (Signed)
Pt wanted son Dannielle Huh contacted to inform of new unit/room number, he was notified of new room number [phone] and update of the day, increased po intake, and up to chair for several hours today. Pt transferred via bed with belongings at this time.

## 2016-01-31 DIAGNOSIS — E1100 Type 2 diabetes mellitus with hyperosmolarity without nonketotic hyperglycemic-hyperosmolar coma (NKHHC): Secondary | ICD-10-CM

## 2016-01-31 DIAGNOSIS — Z794 Long term (current) use of insulin: Secondary | ICD-10-CM

## 2016-01-31 LAB — CULTURE, BODY FLUID W GRAM STAIN -BOTTLE: Culture: NO GROWTH

## 2016-01-31 LAB — BASIC METABOLIC PANEL
Anion gap: 9 (ref 5–15)
BUN: 7 mg/dL (ref 6–20)
CALCIUM: 8.6 mg/dL — AB (ref 8.9–10.3)
CO2: 21 mmol/L — AB (ref 22–32)
CREATININE: 1.1 mg/dL — AB (ref 0.44–1.00)
Chloride: 110 mmol/L (ref 101–111)
GFR calc Af Amer: 55 mL/min — ABNORMAL LOW (ref >60)
GFR calc non Af Amer: 47 mL/min — ABNORMAL LOW (ref >60)
GLUCOSE: 136 mg/dL — AB (ref 65–99)
Potassium: 4.3 mmol/L (ref 3.5–5.1)
Sodium: 140 mmol/L (ref 135–145)

## 2016-01-31 LAB — CBC
HEMATOCRIT: 29.3 % — AB (ref 36.0–46.0)
Hemoglobin: 8.7 g/dL — ABNORMAL LOW (ref 12.0–15.0)
MCH: 26.5 pg (ref 26.0–34.0)
MCHC: 29.7 g/dL — AB (ref 30.0–36.0)
MCV: 89.3 fL (ref 78.0–100.0)
Platelets: 309 10*3/uL (ref 150–400)
RBC: 3.28 MIL/uL — ABNORMAL LOW (ref 3.87–5.11)
RDW: 16.6 % — AB (ref 11.5–15.5)
WBC: 10.4 10*3/uL (ref 4.0–10.5)

## 2016-01-31 LAB — GLUCOSE, CAPILLARY
GLUCOSE-CAPILLARY: 168 mg/dL — AB (ref 65–99)
Glucose-Capillary: 163 mg/dL — ABNORMAL HIGH (ref 65–99)
Glucose-Capillary: 137 mg/dL — ABNORMAL HIGH (ref 65–99)
Glucose-Capillary: 219 mg/dL — ABNORMAL HIGH (ref 65–99)
Glucose-Capillary: 128 mg/dL — ABNORMAL HIGH (ref 65–99)

## 2016-01-31 LAB — CULTURE, BODY FLUID-BOTTLE

## 2016-01-31 MED ORDER — INSULIN GLARGINE 100 UNIT/ML ~~LOC~~ SOLN
10.0000 [IU] | Freq: Every day | SUBCUTANEOUS | Status: DC
  Administered 2016-01-31 – 2016-02-01 (×2): 10 [IU] via SUBCUTANEOUS
  Filled 2016-01-31 (×3): qty 0.1

## 2016-01-31 MED ORDER — METOPROLOL TARTRATE 50 MG PO TABS: 50.0000 mg | ORAL_TABLET | Freq: Three times a day (TID) | ORAL | Status: DC

## 2016-01-31 MED ORDER — INSULIN GLARGINE 100 UNIT/ML SOLOSTAR PEN: 10.0000 [IU] | PEN_INJECTOR | Freq: Every day | SUBCUTANEOUS | Status: DC

## 2016-01-31 MED ORDER — LEVOTHYROXINE SODIUM 100 MCG PO TABS: 100.0000 ug | ORAL_TABLET | Freq: Every day | ORAL | Status: DC

## 2016-01-31 MED ORDER — ALPRAZOLAM 0.25 MG PO TABS
0.2500 mg | ORAL_TABLET | Freq: Once | ORAL | Status: AC
  Administered 2016-01-31: 0.25 mg via ORAL
  Filled 2016-01-31: qty 1

## 2016-01-31 MED ORDER — ALPRAZOLAM 0.25 MG PO TABS
0.2500 mg | ORAL_TABLET | Freq: Once | ORAL | Status: AC
Start: 2016-01-31 — End: 2016-01-31
  Administered 2016-01-31: 0.25 mg via ORAL
  Filled 2016-01-31: qty 1

## 2016-01-31 NOTE — Progress Notes (Addendum)
[address]       [place] [address]             [phone]      5 Days Post-Op Procedure(s) (LRB): VIDEO ASSISTED THORACOSCOPY (VATS) for drainage of loculated right pleural effusion (Right) Subjective: Breathing is comfortable  Objective: Vital signs in last 24 hours: Temp:  [97.9 F (36.6 C)-98.6 F (37 C)] 97.9 F (36.6 C) (03/29 0546) Pulse Rate:  [69-85] 85 (03/29 0546) Cardiac Rhythm:  [-] Normal sinus rhythm (03/29 0700) Resp:  [16-21] 16 (03/29 0546) BP: (141-171)/(56-67) 171/62 mmHg (03/29 0546) SpO2:  [97 %-100 %] 100 % (03/29 0546) Weight:  [168 lb 10.4 oz (76.5 kg)] 168 lb 10.4 oz (76.5 kg) (03/29 0347)  Hemodynamic parameters for last 24 hours:    Intake/Output from previous day: 03/28 0701 - 03/29 0700 In: 2344.5 [P.O.:1252; I.V.:792.5; IV Piggyback:300] Out: 2850 [Urine:2850] Intake/Output this shift:    General appearance: alert, cooperative and no distress Heart: regular rate and rhythm Lungs: dim in right  base Abdomen: benign Extremities: min edema Wound: incis healing well  Lab Results:  Recent Labs  01/30/16 0324 01/31/16 0543  WBC 11.9* 10.4  HGB 8.6* 8.7*  HCT 27.0* 29.3*  PLT 293 309   BMET:  Recent Labs  01/30/16 0324 01/31/16 0543  NA 137 140  K 4.5 4.3  CL 110 110  CO2 20* 21*  GLUCOSE 133* 136*  BUN 10 7  CREATININE 1.21* 1.10*  CALCIUM 8.2* 8.6*    PT/INR: No results for input(s): LABPROT, INR in the last 72 hours. ABG No results found for: PHART, HCO3, TCO2, ACIDBASEDEF, O2SAT CBG (last 3)   Recent Labs  01/30/16 1634 01/30/16 2215 01/31/16 0820  GLUCAP 166* 163* 137*    Meds Scheduled Meds: . ampicillin-sulbactam (UNASYN) IV  3 g Intravenous Q8H  . aspirin  81 mg Oral Daily  . bisacodyl  10 mg Oral Daily  . guaiFENesin  600 mg Oral BID  . insulin aspart  0-24 Units Subcutaneous TID WC  . levothyroxine  100 mcg Oral QAC breakfast  . metoprolol tartrate  25 mg Oral BID  .  pantoprazole  40 mg Oral BID  . predniSONE  10 mg Oral Q breakfast   Continuous Infusions:  PRN Meds:.acetaminophen, fentaNYL (SUBLIMAZE) injection, HYDROcodone-acetaminophen, levalbuterol, loperamide, ondansetron (ZOFRAN) IV, zolpidem  Xrays Dg Chest Port 1 View  01/30/2016  CLINICAL DATA:  Video-assisted thoracotomy, pleural and pericardial effusion, pulmonary embolism and cor pulmonale. EXAM: PORTABLE CHEST 1 VIEW COMPARISON:  Portable chest x-ray of January 29, 2016 FINDINGS: The left lung is well-expanded and clear. On the right there is persistent alveolar opacity in the mid and lower lung and perihilar region with small right pleural effusion. No pneumothorax is evident. The cardiac silhouette is enlarged. The pulmonary vascularity is mildly prominent centrally but there is no cephalization of the vascular pattern. IMPRESSION: Slight interval improvement in the appearance of the chest with nearly normal left lung. Persistent pleural effusion and subsegmental atelectasis in the right mid and lower lung. Stable cardiomegaly without significant pulmonary vascular congestion. Electronically Signed   By: David  [place] M.D.   On: 01/30/2016 07:30    Assessment/Plan: S/P Procedure(s) (LRB): VIDEO ASSISTED THORACOSCOPY (VATS) for drainage of loculated right pleural effusion (Right)  1 she is stable from CV surgery perspective 2 some htn- per medicine management 3 labs- improved WBC count and H/H   LOS: 9 days  GOLD,WAYNE E 01/31/2016   Chart reviewed, patient examined, agree with above. Will keep chest tube suture in for 10 days to let this heal.  Continue IS, mobilization

## 2016-01-31 NOTE — Progress Notes (Signed)
Patient ID: Kirsten Gallegos, female   DOB: 03/03/1936, 80 y.o.   MRN: [id number]  [facility] PROGRESS NOTE  Kirsten Gallegos MKL:[id number] DOB: [birth year] DOA: 01/22/2016 PCP: Kirsten Mulling, MD   Brief narrative:    80 year-old female presented with right sided chest pain several days in duration with intermittent and mixed episodes of non productive and productive cough of clear and yellow sputum. Imaging studies in ED, notable for right lower lobe PE with ? RV strain which was not demonstrated on follow up ECHO. Due to presence of right loculated pleural effusion, pt underwent VATS on 3/24, recommendation was to hold [facility] for one week with plan to resume on 3/31 (consideration to be given to NOAC).  Assessment/Plan:    Acute pulmonary embolism, right lower lobe - pt is s/p IVF (prior to VATS) - clinically stable and comfortable,denies dyspnea or chest pain this AM - resume AC by 3/31  Loculated Pleural effusion status post VATS procedure - Parapneumonic vs secondary to rib fractures. US-guidedthoracentesis yielded 50 mL of very thick bloody effusion - s/p VATS, post op day #5, CTS following and we appreciate assistance  - was initially on vancomycin and zosyn but was transitioned to Unasyn, cultures negative to date  - chest tube removed 3/26, WBC is now WNL, pt remains afebrile in the past 48 hours   Nausea and Vomiting - unclear etiology but resolved at this time - encourage ambulation as pt able to tolerate  - would think that SNF is more suitable for pt upon discharge but she has refused a this point   Acute kidney injury  - suspect pre renal component in the setting of the above mentioned problems  - Cr nicely trending down - encouraged oral intake   Pericardial effusion - small effusion based on ECHO - no further work up indicated per cardiology  - since no chest pain, would d/c telemetry monitor as pt was on telemetry since 3/20  Rib fractures - PT/OT  evaluation requested and done - allow analgesia as needed, avoiding oversedation in an effort to maximize mobility as pt able to tolerate  - this was discussed with pt in detail   Diiabetes mellitus type 2 with long term use of Lantus  - HbA1c 11.7 - resume Lantus - diabetic educator consulted   Hypothyroidism - Continue Synthroid  History of rheumatoid arthritis on Chronic steroids - on daily prednisone which is likely making hyperglycemia worse, so need better CBG control   Essential hypertension - reasonable inpatient control - continue Metoprolol for now and adjust the dose and frequency as indicated   Normocytic anemia - no signs of bleeding - CBC In AM  Hypokalemia - supplemented and WNL this AM  Stage II pressure ulcer noted to the right heel, left buttock and an unstageable pressure injury. Right lateral foot - Seen by wound care. Air mattress   DVT prophylaxis - SCD's  Code Status: Full.  Family Communication:  plan of care discussed with the patient Disposition Plan: SNF vs home with [facility] PT/OT by 3/31  IV access:  Peripheral IV  Procedures and diagnostic studies:     Ct Angio Chest Pe W/cm &/or Wo Cm 01/24/2016  Right lower lobe pulmonary embolism. Additionally there is right lower lobe consolidation and associated large effusion. No evidence of right heart strain is noted. Mild left basilar atelectasis. Healing right sixth rib fracture. Critical Value/emergent results were called by telephone at the time of interpretation on 01/24/2016 at 1:46 pm to  Dr. Richarda Gallegos , who verbally acknowledged these results.  Ir Ivc Filter Plmt / S&i /img Guid/mod Sed 01/25/2016  Successful placement of a retrievable IVC filter. This IVC filter is potentially retrievable. The patient will be assessed for filter retrieval by Interventional Radiology in approximately 8-12 weeks. Further recommendations regarding filter retrieval, continued surveillance or declaration of device  permanence, will be made at that time.  Dg Chest Port 1 View 01/30/2016  Slight interval improvement in the appearance of the chest with nearly normal left lung. Persistent pleural effusion and subsegmental atelectasis in the right mid and lower lung. Stable cardiomegaly without significant pulmonary vascular congestion.   Dg Chest Port 1 View 01/22/2016 Right pleural effusion and likely underlying infiltrate. Left basilar atelectasis is noted.   Dg Abd 2 Views 01/29/2016  No acute findings.   Medical Consultants:  CTS   Other Consultants:  PT/OT  IAnti-Infectives:   Kirsten Payor, MD  [facility] Pager [phone]  If 7PM-7AM, please contact night-coverage [email or web address] Password [email or web address] 01/31/2016, 1:24 PM   LOS: 9 days   HPI/Subjective: No events overnight.   Objective: Filed Vitals:   01/31/16 0347 01/31/16 0546 01/31/16 0947 01/31/16 0951  BP:  171/62 143/59 143/59  Pulse:  85 88 88  Temp:  97.9 F (36.6 C)    TempSrc:  Oral    Resp:  16    Height:      Weight: 76.5 kg (168 lb 10.4 oz)     SpO2:  100%      Intake/Output Summary (Last 24 hours) at 01/31/16 1324 Last data filed at 01/31/16 1100  Gross per 24 hour  Intake 2364.5 ml  Output    900 ml  Net 1464.5 ml    Exam:   General:  Pt is alert, follows commands appropriately, not in acute distress  Cardiovascular: Regular rate and rhythm, no rubs, no gallops  Respiratory: Clear to auscultation bilaterally, diminished breath sounds at bases   Abdomen: Soft, non tender, non distended, bowel sounds present, no guarding  Extremities: pulses DP and PT palpable bilaterally  Data Reviewed: Basic Metabolic Panel:  Recent Labs Lab 01/27/16 0505 01/28/16 0245 01/29/16 0330 01/30/16 0324 01/31/16 0543  NA 141 139 137 137 140  K 3.6 3.4* 3.6 4.5 4.3  CL 110 110 106 110 110  CO2 22 21* 20* 20* 21*  GLUCOSE 128* 98 112* 133* 136*  BUN 8 10 10 10 7   CREATININE 1.26* 1.24* 1.27* 1.21* 1.10*   CALCIUM 8.0* 8.3* 8.3* 8.2* 8.6*  MG  --   --  2.1  --   --    Liver Function Tests:  Recent Labs Lab 01/26/16 0530 01/27/16 0505 01/28/16 0245 01/29/16 0330 01/30/16 0324  AST 16 20 17 20 19   ALT 9* 10* 9* 11* 10*  ALKPHOS 66 61 63 81 71  BILITOT 0.4 0.4 0.3 0.3 0.4  PROT 5.7* 5.5* 5.3* 5.8* 5.4*  ALBUMIN 1.8* 1.9* 1.8* 1.9* 1.8*   CBC:  Recent Labs Lab 01/27/16 0505 01/28/16 0245 01/29/16 0330 01/30/16 0324 01/31/16 0543  WBC 11.5* 15.3* 15.9* 11.9* 10.4  HGB 9.0* 8.7* 9.4* 8.6* 8.7*  HCT 28.6* 29.1* 31.1* 27.0* 29.3*  MCV 88.5 89.3 87.4 88.5 89.3  PLT 365 353 348 293 309   CBG:  Recent Labs Lab 01/30/16 1151 01/30/16 1634 01/30/16 2215 01/31/16 0820 01/31/16 1135  GLUCAP 237* 166* 163* 137* 168*    Recent Results (from the past 240 hour(s))  MRSA PCR  Screening     Status: None   Collection Time: 01/22/16  8:18 PM  Result Value Ref Range Status   MRSA by PCR NEGATIVE NEGATIVE Final    Comment:        The GeneXpert MRSA Assay (FDA approved for NASAL specimens only), is one component of a comprehensive MRSA colonization surveillance program. It is not intended to diagnose MRSA infection nor to guide or monitor treatment for MRSA infections.   Culture, body fluid-bottle     Status: None   Collection Time: 01/23/16 12:49 PM  Result Value Ref Range Status   Specimen Description FLUID RIGHT PLEURAL  Final   Special Requests NONE  Final   Culture NO GROWTH 5 DAYS  Final   Report Status 01/28/2016 FINAL  Final  Gram stain     Status: None   Collection Time: 01/23/16 12:49 PM  Result Value Ref Range Status   Specimen Description FLUID RIGHT PLEURAL  Final   Special Requests NONE  Final   Gram Stain   Final    ABUNDANT WBC PRESENT,BOTH PMN AND MONONUCLEAR NO ORGANISMS SEEN    Report Status 01/23/2016 FINAL  Final  Fungus Culture With Stain (Not @ [facility])     Status: None (Preliminary result)   Collection Time: 01/26/16  3:10 PM  Result Value  Ref Range Status   Fungus Stain Final report  Final    Comment: (NOTE) Performed At: [facility] [address] [place], [place] [id number] Mila Homer MD [id number]    Fungus (Mycology) Culture PENDING  Incomplete   Fungal Source FLUID  Final    Comment: PLEURAL RIGHT   Acid Fast Smear (AFB)     Status: None   Collection Time: 01/26/16  3:10 PM  Result Value Ref Range Status   AFB Specimen Processing Concentration  Final   Acid Fast Smear Negative  Final    Comment: (NOTE) Performed At: [facility] [address] [place], [place] [id number] Mila Homer MD [id number]    Source (AFB) FLUID  Final    Comment: PLEURAL RIGHT   Culture, body fluid-bottle     Status: None   Collection Time: 01/26/16  3:10 PM  Result Value Ref Range Status   Specimen Description FLUID PLEURAL RIGHT  Final   Special Requests BOTTLES DRAWN AEROBIC AND ANAEROBIC 10CC  Final   Culture NO GROWTH 5 DAYS  Final   Report Status 01/31/2016 FINAL  Final  Gram stain     Status: None   Collection Time: 01/26/16  3:10 PM  Result Value Ref Range Status   Specimen Description FLUID PLEURAL RIGHT  Final   Special Requests NONE  Final   Gram Stain   Final    WBC PRESENT,BOTH PMN AND MONONUCLEAR NO ORGANISMS SEEN CYTOSPIN    Report Status 01/26/2016 FINAL  Final  Fungus Culture Result     Status: None   Collection Time: 01/26/16  3:10 PM  Result Value Ref Range Status   Result 1 Comment  Final    Comment: (NOTE) KOH/Calcofluor preparation:  no fungus observed. Performed At: [facility] [address] [place], [place] [id number] Mila Homer MD [id number]   C difficile quick scan w PCR reflex     Status: None   Collection Time: 01/28/16  6:20 PM  Result Value Ref Range Status   C Diff antigen NEGATIVE NEGATIVE Final   C Diff toxin NEGATIVE NEGATIVE Final   C Diff interpretation Negative for toxigenic  C. difficile  Final     Scheduled Meds: .  ampicillin-sulbactam (UNASYN) IV  3 g Intravenous Q8H  . aspirin  81 mg Oral Daily  . bisacodyl  10 mg Oral Daily  . guaiFENesin  600 mg Oral BID  . insulin aspart  0-24 Units Subcutaneous TID WC  . levothyroxine  100 mcg Oral QAC breakfast  . metoprolol tartrate  25 mg Oral BID  . pantoprazole  40 mg Oral BID  . predniSONE  10 mg Oral Q breakfast   Continuous Infusions:

## 2016-02-01 LAB — BASIC METABOLIC PANEL
ANION GAP: 9 (ref 5–15)
BUN: 9 mg/dL (ref 6–20)
CALCIUM: 8.6 mg/dL — AB (ref 8.9–10.3)
CO2: 22 mmol/L (ref 22–32)
Chloride: 110 mmol/L (ref 101–111)
Creatinine, Ser: 1.06 mg/dL — ABNORMAL HIGH (ref 0.44–1.00)
GFR, EST AFRICAN AMERICAN: 57 mL/min — AB (ref >60)
GFR, EST NON AFRICAN AMERICAN: 49 mL/min — AB (ref >60)
Glucose, Bld: 127 mg/dL — ABNORMAL HIGH (ref 65–99)
POTASSIUM: 4 mmol/L (ref 3.5–5.1)
Sodium: 141 mmol/L (ref 135–145)

## 2016-02-01 LAB — CBC
HEMATOCRIT: 27.8 % — AB (ref 36.0–46.0)
Hemoglobin: 8.4 g/dL — ABNORMAL LOW (ref 12.0–15.0)
MCH: 26.8 pg (ref 26.0–34.0)
MCHC: 30.2 g/dL (ref 30.0–36.0)
MCV: 88.5 fL (ref 78.0–100.0)
Platelets: 333 10*3/uL (ref 150–400)
RBC: 3.14 MIL/uL — AB (ref 3.87–5.11)
RDW: 16.8 % — AB (ref 11.5–15.5)
WBC: 9.7 10*3/uL (ref 4.0–10.5)

## 2016-02-01 LAB — GLUCOSE, CAPILLARY
GLUCOSE-CAPILLARY: 133 mg/dL — AB (ref 65–99)
Glucose-Capillary: 131 mg/dL — ABNORMAL HIGH (ref 65–99)
Glucose-Capillary: 213 mg/dL — ABNORMAL HIGH (ref 65–99)
Glucose-Capillary: 262 mg/dL — ABNORMAL HIGH (ref 65–99)

## 2016-02-01 MED ORDER — APIXABAN 5 MG PO TABS
10.0000 mg | ORAL_TABLET | Freq: Two times a day (BID) | ORAL | Status: DC
  Administered 2016-02-02: 10 mg via ORAL
  Filled 2016-02-01 (×2): qty 2

## 2016-02-01 MED ORDER — AMOXICILLIN-POT CLAVULANATE 875-125 MG PO TABS
1.0000 | ORAL_TABLET | Freq: Two times a day (BID) | ORAL | Status: DC
  Administered 2016-02-01 – 2016-02-02 (×3): 1 via ORAL
  Filled 2016-02-01 (×3): qty 1

## 2016-02-01 MED ORDER — ALPRAZOLAM 0.25 MG PO TABS
0.2500 mg | ORAL_TABLET | Freq: Once | ORAL | Status: AC
  Administered 2016-02-01: 0.25 mg via ORAL
  Filled 2016-02-01: qty 1

## 2016-02-01 MED ORDER — APIXABAN 5 MG PO TABS: 5.0000 mg | ORAL_TABLET | Freq: Two times a day (BID) | ORAL | Status: DC

## 2016-02-01 NOTE — Care Management Important Message (Signed)
Important Message  Patient Details  Name: Kirsten Gallegos MRN: [id number] Date of Birth: 1936/05/08   Medicare Important Message Given:  Yes    Kyla Balzarine 02/01/2016, 10:44 AM

## 2016-02-01 NOTE — Progress Notes (Signed)
Physical Therapy Treatment Patient Details Name: Kirsten Gallegos MRN: [id number] DOB: 1936-05-01 Today's Date: 02/01/2016    History of Present Illness 80 y.o. female with a history of CAD, RA, HTN, DM, HL and hypothyroidism who transferred from [facility] hospital 01/22/16 with PE    PT Comments    Pt performed increased gait training and tolerated increased transfer activities.  Pt remains anxious and easily fatigued with activity.  Pt had BM and voided during session.    Follow Up Recommendations  Home health PT;Supervision/Assistance - 24 hour (declines short term SNF stay despite recommendations.  )     Equipment Recommendations  None recommended by PT    Recommendations for Other Services       Precautions / Restrictions Precautions Precautions: Fall Restrictions Weight Bearing Restrictions: No    Mobility  Bed Mobility Overal bed mobility:  (Pt received in recliner chair.)                Transfers Overall transfer level: Needs assistance Equipment used: Rolling walker (2 wheeled) Transfers: Sit to/from Stand Sit to Stand: Min assist;Mod assist (Pt required min from recliner and mod assist from low seated commode.  )         General transfer comment: Min assist to power up to standing secondary to poor ability to push through hands to elevate trunk.  Pt presents with increased time to complete transfer from lower seated commode and increased assist.  Pt performed with increased time.     Ambulation/Gait Ambulation/Gait assistance: Min guard Ambulation Distance (Feet): 20 Feet (+ 30ft +32 ft.  )   Gait Pattern/deviations: Step-through pattern;Shuffle;Steppage;Trunk flexed     General Gait Details: increased anxiety remains with mobility. Tolerated ambulation to the hall but became very anxious and requested to sit.  Pt presents with 2/4 DOE.  O2 sats 98% on RA.     Stairs            Wheelchair Mobility    Modified Rankin (Stroke Patients  Only)       Balance Overall balance assessment: Needs assistance   Sitting balance-Leahy Scale: Good       Standing balance-Leahy Scale: Fair                      Cognition Arousal/Alertness: Awake/alert Behavior During Therapy: Anxious Overall Cognitive Status: Within Functional Limits for tasks assessed                      Exercises      General Comments        Pertinent Vitals/Pain Pain Assessment: No/denies pain Faces Pain Scale: Hurts even more Pain Location: Pt remains to c/o pain in back and hands. Pain Descriptors / Indicators: Sore Pain Intervention(s): Monitored during session;Repositioned    Home Living                      Prior Function            PT Goals (current goals can now be found in the care plan section) Acute Rehab PT Goals Patient Stated Goal: to go home Potential to Achieve Goals: Good Progress towards PT goals: Progressing toward goals    Frequency  Min 3X/week    PT Plan Current plan remains appropriate    Co-evaluation             End of Session Equipment Utilized During Treatment: Gait belt Activity Tolerance: Patient limited by fatigue;Patient  limited by lethargy Patient left: in chair;with call bell/phone within reach;with chair alarm set     Time: 1340-1408 PT Time Calculation (min) (ACUTE ONLY): 28 min  Charges:  $Gait Training: 8-22 mins $Therapeutic Activity: 8-22 mins                    G Codes:      Florestine Avers 2016-02-20, 3:12 PM  Joycelyn Rua, PTA pager [phone]

## 2016-02-01 NOTE — Progress Notes (Signed)
ANTICOAGULATION CONSULT NOTE - Initial Consult  Pharmacy Consult for Apixaban Indication: pulmonary embolus  Allergies  Allergen Reactions  . Cephalexin Other (See Comments)    Pt states since she had meningitis, she not able to take antibiotic, but did not give reactions.   . Codeine Nausea And Vomiting    Patient Measurements: Height: 5\' 6"  (167.6 cm) Weight: 167 lb 5.3 oz (75.9 kg) IBW/kg (Calculated) : 59.3 Heparin Dosing Weight:   Vital Signs: Temp: 98.3 F (36.8 C) (03/30 0423) Temp Source: Oral (03/30 0423) BP: 170/89 mmHg (03/30 0827) Pulse Rate: 88 (03/30 0827)  Labs:  Recent Labs  01/30/16 0324 01/31/16 0543 02/01/16 0625  HGB 8.6* 8.7* 8.4*  HCT 27.0* 29.3* 27.8*  PLT 293 309 333  CREATININE 1.21* 1.10* 1.06*    Estimated Creatinine Clearance: 46.2 mL/min (by C-G formula based on Cr of 1.06).   Medical History: Past Medical History  Diagnosis Date  . Reflux   . Hypertension   . Rheumatoid arthritis (HCC)   . Heart attack (HCC)     Medications:  Scheduled:  . amoxicillin-clavulanate  1 tablet Oral Q12H  . aspirin  81 mg Oral Daily  . bisacodyl  10 mg Oral Daily  . guaiFENesin  600 mg Oral BID  . insulin aspart  0-24 Units Subcutaneous TID WC  . insulin glargine  10 Units Subcutaneous QHS  . levothyroxine  100 mcg Oral QAC breakfast  . metoprolol tartrate  25 mg Oral BID  . pantoprazole  40 mg Oral BID  . predniSONE  10 mg Oral Q breakfast    Assessment: 80yo female with acute PE.  Anticoagulation has been held x 1wk s/p VATS, ok to resume 3/31 per TCTS.  A IVC filter was placed prior to VATS.  Hg has been stable and pltc is wnl.  CT has been removed, with plan to remove sutures in 10 days.  Goal of Therapy:  Treatment of PE and prevention of recurrence Monitor platelets by anticoagulation protocol: Yes   Plan:  Apixaban 10mg  bid x 7 days, then decrease to 5mg  bid- to start tomorrow. Watch for s/s of bleeding Pt education  4/31, PharmD Clinical Pharmacist [facility]

## 2016-02-01 NOTE — Clinical Documentation Improvement (Signed)
Cardiology Internal Medicine  Can the diagnosis of anemia be further specified? Please document findings in next progress note. Thank you.   Iron deficiency Anemia  Nutritional anemia, including the nutrition or mineral deficits  Chronic Blood Loss Anemia, including the suspected or known cause  Anemia of chronic disease, including the associated chronic disease state  Other  Clinically Undetermined  Document any associated diagnoses/conditions.  Supporting Information:  Anemia-H and H decreased to 8.6 and 27   Please exercise your independent, professional judgment when responding. A specific answer is not anticipated or expected.  Thank You,  Shellee Milo RN, BSN, CCDS Health Information Management [place] [phone]; Cell: [phone]

## 2016-02-01 NOTE — Progress Notes (Addendum)
Inpatient Diabetes Program Recommendations  AACE/ADA: New Consensus Statement on Inpatient Glycemic Control (2015)  Target Ranges:  Prepandial:   less than 140 mg/dL      Peak postprandial:   less than 180 mg/dL (1-2 hours)      Critically ill patients:  140 - 180 mg/dL   Review of Glycemic Control  Inpatient Diabetes Program Recommendations:  Correction (SSI): Change to moderate scale per Glycemic Control order-set ADD: this coordinator met with patient to discuss elevated A1C.  Pt states she has only recently started insulin and her son administers it to her.  Gave patient Diabetes Meal Planning Guide as pt states she and her son both prepare meals.  No questions/concerns at the end of our visit.  Thank you  Raoul Pitch BSN, RN,CDE Inpatient Diabetes Coordinator [phone] (team pager)

## 2016-02-01 NOTE — Progress Notes (Signed)
Patient ID: Kirsten Gallegos, female   DOB: 12-Nov-1935, 80 y.o.   MRN: [id number]  [facility] PROGRESS NOTE  Kirsten Gallegos XTG:[id number] DOB: 23-Jun-1936 DOA: 01/22/2016 PCP: Quentin Mulling, MD   Brief narrative:    80 year-old female presented with right sided chest pain several days in duration with intermittent and mixed episodes of non productive and productive cough of clear and yellow sputum. Imaging studies in ED, notable for right lower lobe PE with ? RV strain which was not demonstrated on follow up ECHO. Due to presence of right loculated pleural effusion, pt underwent VATS on 3/24, recommendation was to hold [facility] for one week with plan to resume on 3/31 (consideration to be given to NOAC).  Assessment/Plan:    Acute pulmonary embolism, right lower lobe - pt is s/p IVF (prior to VATS) - clinically stable and comfortable,denies dyspnea or chest pain this AM - resume AC by 3/31  Loculated Pleural effusion status post VATS procedure - Parapneumonic vs secondary to rib fractures. US-guidedthoracentesis yielded 50 mL of very thick bloody effusion - s/p VATS, post op day #6, CTS following and we appreciate assistance  - was initially on vancomycin and zosyn but was transitioned to Unasyn, cultures negative to date, will transition to Augmentin  - chest tube removed 3/26, WBC is now WNL, pt remains afebrile in the past 72 hours, WBC WNL  Nausea and Vomiting - unclear etiology but resolved at this time - encourage ambulation as pt able to tolerate   Acute kidney injury  - suspect pre renal component in the setting of the above mentioned problems  - Cr nicely trending down - encouraged oral intake   Pericardial effusion - small effusion based on ECHO - no further work up indicated per cardiology  - since no chest pain, would d/c telemetry monitor as pt was on telemetry since 3/20  Rib fractures - PT/OT evaluation requested and done - allow analgesia as needed, avoiding  oversedation in an effort to maximize mobility as pt able to tolerate  - this was discussed with pt in detail   Diiabetes mellitus type 2 with long term use of Lantus  - HbA1c 11.7 - resume Lantus - diabetic educator consulted   Hypothyroidism - Continue Synthroid  History of rheumatoid arthritis on Chronic steroids - on daily prednisone which is likely making hyperglycemia worse, so need better CBG control   Essential hypertension - reasonable inpatient control - continue Metoprolol for now and adjust the dose and frequency as indicated   Normocytic anemia - no signs of bleeding - CBC In AM  Hypokalemia - supplemented and remains WNL this AM  Stage II pressure ulcer noted to the right heel, left buttock and an unstageable pressure injury. Right lateral foot - Seen by wound care. Air mattress   DVT prophylaxis - SCD's  Code Status: Full.  Family Communication:  plan of care discussed with the patient, she has declined my offer to update family,I left my number in case family wants to get in touch with me  Disposition Plan: home 3/31  IV access:  Peripheral IV  Procedures and diagnostic studies:     Ct Angio Chest Pe W/cm &/or Wo Cm 01/24/2016  Right lower lobe pulmonary embolism. Additionally there is right lower lobe consolidation and associated large effusion. No evidence of right heart strain is noted. Mild left basilar atelectasis. Healing right sixth rib fracture. Critical Value/emergent results were called by telephone at the time of interpretation on 01/24/2016 at 1:46  pm to Dr. Richarda Overlie , who verbally acknowledged these results.  Ir Ivc Filter Plmt / S&i /img Guid/mod Sed 01/25/2016  Successful placement of a retrievable IVC filter. This IVC filter is potentially retrievable. The patient will be assessed for filter retrieval by Interventional Radiology in approximately 8-12 weeks. Further recommendations regarding filter retrieval, continued surveillance or  declaration of device permanence, will be made at that time.  Dg Chest Port 1 View 01/30/2016  Slight interval improvement in the appearance of the chest with nearly normal left lung. Persistent pleural effusion and subsegmental atelectasis in the right mid and lower lung. Stable cardiomegaly without significant pulmonary vascular congestion.   Dg Chest Port 1 View 01/22/2016 Right pleural effusion and likely underlying infiltrate. Left basilar atelectasis is noted.   Dg Abd 2 Views 01/29/2016  No acute findings.   Medical Consultants:  CTS   Other Consultants:  PT/OT  IAnti-Infectives:   Unasyn, change to Augmentin 3/30  Debbora Presto, MD  [facility] Pager [phone]  If 7PM-7AM, please contact night-coverage [email or web address] Password [email or web address] 02/01/2016, 2:18 PM   LOS: 10 days   HPI/Subjective: No events overnight.   Objective: Filed Vitals:   01/31/16 2217 02/01/16 0423 02/01/16 0500 02/01/16 0827  BP: 176/68 184/73  170/89  Pulse: 83 81  88  Temp: 98.5 F (36.9 C) 98.3 F (36.8 C)    TempSrc: Oral Oral    Resp: 20 20    Height:      Weight:   75.9 kg (167 lb 5.3 oz)   SpO2: 98% 100%      Intake/Output Summary (Last 24 hours) at 02/01/16 1418 Last data filed at 02/01/16 0915  Gross per 24 hour  Intake    120 ml  Output    500 ml  Net   -380 ml    Exam:   General:  Pt is alert, follows commands appropriately, not in acute distress  Cardiovascular: Regular rate and rhythm, no rubs, no gallops  Respiratory: Clear to auscultation bilaterally, diminished breath sounds at bases   Abdomen: Soft, non tender, non distended, bowel sounds present, no guarding  Extremities: pulses DP and PT palpable bilaterally  Data Reviewed: Basic Metabolic Panel:  Recent Labs Lab 01/28/16 0245 01/29/16 0330 01/30/16 0324 01/31/16 0543 02/01/16 0625  NA 139 137 137 140 141  K 3.4* 3.6 4.5 4.3 4.0  CL 110 106 110 110 110  CO2 21* 20* 20* 21* 22  GLUCOSE 98 112* 133* 136*  127*  BUN 10 10 10 7 9   CREATININE 1.24* 1.27* 1.21* 1.10* 1.06*  CALCIUM 8.3* 8.3* 8.2* 8.6* 8.6*  MG  --  2.1  --   --   --    Liver Function Tests:  Recent Labs Lab 01/26/16 0530 01/27/16 0505 01/28/16 0245 01/29/16 0330 01/30/16 0324  AST 16 20 17 20 19   ALT 9* 10* 9* 11* 10*  ALKPHOS 66 61 63 81 71  BILITOT 0.4 0.4 0.3 0.3 0.4  PROT 5.7* 5.5* 5.3* 5.8* 5.4*  ALBUMIN 1.8* 1.9* 1.8* 1.9* 1.8*   CBC:  Recent Labs Lab 01/28/16 0245 01/29/16 0330 01/30/16 0324 01/31/16 0543 02/01/16 0625  WBC 15.3* 15.9* 11.9* 10.4 9.7  HGB 8.7* 9.4* 8.6* 8.7* 8.4*  HCT 29.1* 31.1* 27.0* 29.3* 27.8*  MCV 89.3 87.4 88.5 89.3 88.5  PLT 353 348 293 309 333   CBG:  Recent Labs Lab 01/31/16 1135 01/31/16 1713 01/31/16 2219 02/01/16 0755 02/01/16 1149  GLUCAP 168* 219* 128* 131* 213*  Recent Results (from the past 240 hour(s))  MRSA PCR Screening     Status: None   Collection Time: 01/22/16  8:18 PM  Result Value Ref Range Status   MRSA by PCR NEGATIVE NEGATIVE Final    Comment:        The GeneXpert MRSA Assay (FDA approved for NASAL specimens only), is one component of a comprehensive MRSA colonization surveillance program. It is not intended to diagnose MRSA infection nor to guide or monitor treatment for MRSA infections.   Culture, body fluid-bottle     Status: None   Collection Time: 01/23/16 12:49 PM  Result Value Ref Range Status   Specimen Description FLUID RIGHT PLEURAL  Final   Special Requests NONE  Final   Culture NO GROWTH 5 DAYS  Final   Report Status 01/28/2016 FINAL  Final  Gram stain     Status: None   Collection Time: 01/23/16 12:49 PM  Result Value Ref Range Status   Specimen Description FLUID RIGHT PLEURAL  Final   Special Requests NONE  Final   Gram Stain   Final    ABUNDANT WBC PRESENT,BOTH PMN AND MONONUCLEAR NO ORGANISMS SEEN    Report Status 01/23/2016 FINAL  Final  Fungus Culture With Stain (Not @ [facility])     Status: None  (Preliminary result)   Collection Time: 01/26/16  3:10 PM  Result Value Ref Range Status   Fungus Stain Final report  Final    Comment: (NOTE) Performed At: [facility] [address] [place], [place] [id number] Mila Homer MD [id number]    Fungus (Mycology) Culture PENDING  Incomplete   Fungal Source FLUID  Final    Comment: PLEURAL RIGHT   Acid Fast Smear (AFB)     Status: None   Collection Time: 01/26/16  3:10 PM  Result Value Ref Range Status   AFB Specimen Processing Concentration  Final   Acid Fast Smear Negative  Final    Comment: (NOTE) Performed At: [facility] [address] [place], [place] [id number] Mila Homer MD YB:[id number]    Source (AFB) FLUID  Final    Comment: PLEURAL RIGHT   Culture, body fluid-bottle     Status: None   Collection Time: 01/26/16  3:10 PM  Result Value Ref Range Status   Specimen Description FLUID PLEURAL RIGHT  Final   Special Requests BOTTLES DRAWN AEROBIC AND ANAEROBIC 10CC  Final   Culture NO GROWTH 5 DAYS  Final   Report Status 01/31/2016 FINAL  Final  Gram stain     Status: None   Collection Time: 01/26/16  3:10 PM  Result Value Ref Range Status   Specimen Description FLUID PLEURAL RIGHT  Final   Special Requests NONE  Final   Gram Stain   Final    WBC PRESENT,BOTH PMN AND MONONUCLEAR NO ORGANISMS SEEN CYTOSPIN    Report Status 01/26/2016 FINAL  Final  Fungus Culture Result     Status: None   Collection Time: 01/26/16  3:10 PM  Result Value Ref Range Status   Result 1 Comment  Final    Comment: (NOTE) KOH/Calcofluor preparation:  no fungus observed. Performed At: [facility] [address] [place], [place] [id number] Mila Homer MD [id number]   C difficile quick scan w PCR reflex     Status: None   Collection Time: 01/28/16  6:20 PM  Result Value Ref Range Status   C Diff antigen NEGATIVE NEGATIVE Final   C Diff toxin NEGATIVE  NEGATIVE Final   C Diff interpretation  Negative for toxigenic C. difficile  Final     Scheduled Meds: . amoxicillin-clavulanate  1 tablet Oral Q12H  . [START ON 02/02/2016] apixaban  10 mg Oral BID   Followed by  . [START ON 02/09/2016] apixaban  5 mg Oral BID  . aspirin  81 mg Oral Daily  . bisacodyl  10 mg Oral Daily  . guaiFENesin  600 mg Oral BID  . insulin aspart  0-24 Units Subcutaneous TID WC  . insulin glargine  10 Units Subcutaneous QHS  . levothyroxine  100 mcg Oral QAC breakfast  . metoprolol tartrate  25 mg Oral BID  . pantoprazole  40 mg Oral BID  . predniSONE  10 mg Oral Q breakfast   Continuous Infusions:

## 2016-02-01 NOTE — Discharge Instructions (Addendum)
Video Assisted Thoracoscopic Surgery, Care After Refer to this sheet in the next few weeks. These instructions provide you with information on caring for yourself after your procedure. Your caregiver may also give you more specific instructions. Your procedure has been planned according to current medical practices, but problems sometimes occur. Call your caregiver if you have any problems or questions after your procedure. HOME CARE INSTRUCTIONS   Only take over-the-counter or prescription medications as directed.  Only take pain medications (narcotics) as directed.  Do not drive until your caregiver approves. Driving while taking narcotics or soon after surgery can be dangerous, so discuss the specific timing with your caregiver.  Avoid activities that use your chest muscles, such as lifting heavy objects, for at least 3-4 weeks.   Take deep breaths to expand the lungs and to protect against pneumonia.  Do breathing exercises as directed by your caregiver. If you were given an incentive spirometer to help with breathing, use it as directed.  You may resume a normal diet and activities when you feel you are able to or as directed.  Do not take a bath until your caregiver says it is OK. Use the shower instead.   Keep the bandage (dressing) covering the area where the chest tube was inserted (incision site) dry for 48 hours. After 48 hours, remove the dressing unless there is new drainage.  Remove dressings as directed by your caregiver.  Change dressings if necessary or as directed.  Keep all follow-up appointments. It is important for you to see your caregiver after surgery to discuss appropriate follow-up care and surveillance, if it is necessary. SEEK MEDICAL CARE:  You feel excessive or increasing pain at an incision site.  You notice bleeding, skin irritation, drainage, swelling, or redness at an incision site.  There is a bad smell coming from an incision or dressing.  It  feels like your heart is fluttering or beating rapidly.  Your pain medication does not relieve your pain. SEEK IMMEDIATE MEDICAL CARE IF:   You have a fever.   You have chest pain.  You have a rash.  You have shortness of breath.  You have trouble breathing.   You feel weak, lightheaded, dizzy, or faint.  MAKE SURE YOU:   Understand these instructions.   Will watch your condition.   Will get help right away if you are not doing well or get worse.   This information is not intended to replace advice given to you by your health care provider. Make sure you discuss any questions you have with your health care provider.   Document Released: 02/15/2013 Document Revised: 11/11/2014 Document Reviewed: 02/15/2013 Elsevier Interactive Patient Education 2016 [employer].   Information on my medicine - ELIQUIS (apixaban)  This medication education was reviewed with me or my healthcare representative as part of my discharge preparation.  The pharmacist that spoke with me during my hospital stay was:  Renaee Munda, [facility]  Why was Eliquis prescribed for you? Eliquis was prescribed to treat blood clots that may have been found in the veins of your legs (deep vein thrombosis) or in your lungs (pulmonary embolism) and to reduce the risk of them occurring again.  What do You need to know about Eliquis ? The starting dose is 10 mg (two 5 mg tablets) taken TWICE daily for the FIRST SEVEN (7) DAYS, then on 02/09/16  the dose is reduced to ONE 5 mg tablet taken TWICE daily.  Eliquis may be taken with  or without food.   Try to take the dose about the same time in the morning and in the evening. If you have difficulty swallowing the tablet whole please discuss with your pharmacist how to take the medication safely.  Take Eliquis exactly as prescribed and DO NOT stop taking Eliquis without talking to the doctor who prescribed the medication.  Stopping may increase your risk of  developing a new blood clot.  Refill your prescription before you run out.  After discharge, you should have regular check-up appointments with your healthcare provider that is prescribing your Eliquis.    What do you do if you miss a dose? If a dose of ELIQUIS is not taken at the scheduled time, take it as soon as possible on the same day and twice-daily administration should be resumed. The dose should not be doubled to make up for a missed dose.  Important Safety Information A possible side effect of Eliquis is bleeding. You should call your healthcare provider right away if you experience any of the following: ? Bleeding from an injury or your nose that does not stop. ? Unusual colored urine (red or dark brown) or unusual colored stools (red or black). ? Unusual bruising for unknown reasons. ? A serious fall or if you hit your head (even if there is no bleeding).  Some medicines may interact with Eliquis and might increase your risk of bleeding or clotting while on Eliquis. To help avoid this, consult your healthcare provider or pharmacist prior to using any new prescription or non-prescription medications, including herbals, vitamins, non-steroidal anti-inflammatory drugs (NSAIDs) and supplements.  This website has more information on Eliquis (apixaban): http://www.eliquis.com/eliquis/home

## 2016-02-02 DIAGNOSIS — Z4682 Encounter for fitting and adjustment of non-vascular catheter: Secondary | ICD-10-CM | POA: Insufficient documentation

## 2016-02-02 DIAGNOSIS — G43A1 Cyclical vomiting, intractable: Secondary | ICD-10-CM

## 2016-02-02 LAB — CBC
HCT: 27.8 % — ABNORMAL LOW (ref 36.0–46.0)
HEMOGLOBIN: 8.9 g/dL — AB (ref 12.0–15.0)
MCH: 28.3 pg (ref 26.0–34.0)
MCHC: 32 g/dL (ref 30.0–36.0)
MCV: 88.5 fL (ref 78.0–100.0)
PLATELETS: 341 10*3/uL (ref 150–400)
RBC: 3.14 MIL/uL — AB (ref 3.87–5.11)
RDW: 16.9 % — ABNORMAL HIGH (ref 11.5–15.5)
WBC: 10.3 10*3/uL (ref 4.0–10.5)

## 2016-02-02 LAB — BASIC METABOLIC PANEL
ANION GAP: 10 (ref 5–15)
BUN: 12 mg/dL (ref 6–20)
CHLORIDE: 108 mmol/L (ref 101–111)
CO2: 21 mmol/L — ABNORMAL LOW (ref 22–32)
Calcium: 8.7 mg/dL — ABNORMAL LOW (ref 8.9–10.3)
Creatinine, Ser: 1.11 mg/dL — ABNORMAL HIGH (ref 0.44–1.00)
GFR, EST AFRICAN AMERICAN: 54 mL/min — AB (ref >60)
GFR, EST NON AFRICAN AMERICAN: 47 mL/min — AB (ref >60)
Glucose, Bld: 154 mg/dL — ABNORMAL HIGH (ref 65–99)
POTASSIUM: 3.8 mmol/L (ref 3.5–5.1)
SODIUM: 139 mmol/L (ref 135–145)

## 2016-02-02 LAB — GLUCOSE, CAPILLARY
GLUCOSE-CAPILLARY: 127 mg/dL — AB (ref 65–99)
Glucose-Capillary: 301 mg/dL — ABNORMAL HIGH (ref 65–99)

## 2016-02-02 MED ORDER — PREDNISONE 10 MG PO TABS: 10.0000 mg | ORAL_TABLET | Freq: Every day | ORAL | Status: AC

## 2016-02-02 MED ORDER — ZOLPIDEM TARTRATE 5 MG PO TABS: 5.0000 mg | ORAL_TABLET | Freq: Every evening | ORAL | Status: AC | PRN

## 2016-02-02 MED ORDER — PANTOPRAZOLE SODIUM 40 MG PO TBEC: 40.0000 mg | DELAYED_RELEASE_TABLET | Freq: Every day | ORAL | Status: AC

## 2016-02-02 MED ORDER — ALPRAZOLAM 0.25 MG PO TABS
0.2500 mg | ORAL_TABLET | Freq: Two times a day (BID) | ORAL | Status: AC | PRN
Start: 2016-02-02 — End: ?

## 2016-02-02 MED ORDER — AMOXICILLIN-POT CLAVULANATE 875-125 MG PO TABS: 1.0000 | ORAL_TABLET | Freq: Two times a day (BID) | ORAL | Status: DC

## 2016-02-02 MED ORDER — HYDROCODONE-ACETAMINOPHEN 10-325 MG PO TABS: 1.0000 | ORAL_TABLET | Freq: Three times a day (TID) | ORAL | Status: DC | PRN

## 2016-02-02 MED ORDER — METOPROLOL TARTRATE 25 MG PO TABS
25.0000 mg | ORAL_TABLET | Freq: Two times a day (BID) | ORAL | Status: AC
Start: 2016-02-02 — End: ?

## 2016-02-02 MED ORDER — APIXABAN 5 MG PO TABS: 10.0000 mg | ORAL_TABLET | Freq: Two times a day (BID) | ORAL | Status: DC

## 2016-02-02 MED ORDER — LEVALBUTEROL HCL 0.63 MG/3ML IN NEBU: 0.6300 mg | INHALATION_SOLUTION | Freq: Four times a day (QID) | RESPIRATORY_TRACT | Status: AC | PRN

## 2016-02-02 MED ORDER — INSULIN GLARGINE 100 UNIT/ML SOLOSTAR PEN: 10.0000 [IU] | PEN_INJECTOR | Freq: Every day | SUBCUTANEOUS | Status: AC

## 2016-02-02 MED ORDER — APIXABAN 5 MG PO TABS: 5.0000 mg | ORAL_TABLET | Freq: Two times a day (BID) | ORAL | Status: DC

## 2016-02-02 NOTE — Progress Notes (Signed)
Spoke with patient's son over the phone. He stated that he wife sells insurance and will be able to get policy in the next 30 days that can be retro active to cover Eliquis. Son requested Medicare policy number and SS#, info provided with patient's consent. Patient provided with 30 day card. Family stated that between the four children they would be able to cover the $432 cost of the medication per month until they could obtain medication coverage. CM attempted to update MD, but call was not returned.

## 2016-02-02 NOTE — Discharge Summary (Signed)
Physician Discharge Summary  Kirsten Gallegos KKX:[id number] DOB: 1936-03-03 DOA: 01/22/2016  PCP: Quentin Mulling, MD  Admit date: 01/22/2016 Discharge date: 02/02/2016  Recommendations for Outpatient Follow-up:  1. Pt will need to follow up with PCP in 1-2 weeks post discharge 2. Please obtain BMP to evaluate electrolytes and kidney function 3. Please also check CBC to evaluate Hg and Hct levels 4. Please note that pt was started on Eliquis upon discharge  5. Will need to keep chest tube suture in for 10 days to let this heal.   Discharge Diagnoses:  Active Problems:   Pulmonary embolism (HCC)   Pleural effusion   Pericardial effusion   Rheumatoid arthritis with positive rheumatoid factor (HCC)   Pressure ulcer   Pulmonary embolism with acute cor pulmonale (HCC)   Pleural effusion on right   Shortness of breath   Hypoxemia   DM type 2 (diabetes mellitus, type 2) (HCC)   Emesis  Discharge Condition: Stable  Diet recommendation: Heart healthy diet discussed in details    Brief narrative:    80 year-old female presented with right sided chest pain several days in duration with intermittent and mixed episodes of non productive and productive cough of clear and yellow sputum. Imaging studies in ED, notable for right lower lobe PE with ? RV strain which was not demonstrated on follow up ECHO. Due to presence of right loculated pleural effusion, pt underwent VATS on 3/24, recommendation was to hold [facility] for one week with plan to resume on 3/31 (consideration to be given to NOAC).  Assessment/Plan:    Acute pulmonary embolism, right lower lobe - pt is s/p IVF (prior to VATS) - clinically stable and comfortable,denies dyspnea or chest pain this AM - started Eliquis 3/31 - wants to go home   Loculated Pleural effusion status post VATS procedure - Parapneumonic vs secondary to rib fractures. US-guidedthoracentesis yielded 50 mL of very thick bloody effusion - s/p VATS, post op  day #7, CTS following and we appreciate assistance  - was initially on vancomycin and zosyn but was transitioned to Unasyn, cultures negative to date, transitioned to Augmentin, complete therapy for 5 more days  - chest tube removed 3/26, WBC is now WNL  Nausea and Vomiting - unclear etiology but resolved at this time  Acute kidney injury  - suspect pre renal component in the setting of the above mentioned problems  - Cr nicely trending down  Pericardial effusion - small effusion based on ECHO - no further work up indicated per cardiology  - since no chest pain, would d/c telemetry monitor as pt was on telemetry since 3/20  Rib fractures - PT/OT evaluation requested and done - allow analgesia as needed, avoiding oversedation in an effort to maximize mobility as pt able to tolerate  - this was discussed with pt in detail   Diiabetes mellitus type 2 with long term use of Lantus  - HbA1c 11.7 - resume Lantus per home medical regimen   Hypothyroidism - Continue Synthroid  History of rheumatoid arthritis on Chronic steroids - on daily prednisone which is likely making hyperglycemia worse  Essential hypertension - reasonable inpatient control - resume home medical regimen   Normocytic anemia - no signs of bleeding  Hypokalemia - supplemented and WNL this AM  Stage II pressure ulcer noted to the right heel, left buttock and an unstageable pressure injury. Right lateral foot - Seen by wound care. Air mattress   DVT prophylaxis - SCD's  Code Status: Full.  Family Communication: plan of care discussed with the patient, she has declined my offer to update family,I left my number in case family wants to get in touch with me  Disposition Plan: home   IV access:  Peripheral IV  Procedures and diagnostic studies:    Ct Angio Chest Pe W/cm &/or Wo Cm 01/24/2016 Right lower lobe pulmonary embolism. Additionally there is right lower lobe consolidation and  associated large effusion. No evidence of right heart strain is noted. Mild left basilar atelectasis. Healing right sixth rib fracture. Critical Value/emergent results were called by telephone at the time of interpretation on 01/24/2016 at 1:46 pm to Dr. Richarda Overlie , who verbally acknowledged these results.  Ir Ivc Filter Plmt / S&i /img Guid/mod Sed 01/25/2016 Successful placement of a retrievable IVC filter. This IVC filter is potentially retrievable. The patient will be assessed for filter retrieval by Interventional Radiology in approximately 8-12 weeks. Further recommendations regarding filter retrieval, continued surveillance or declaration of device permanence, will be made at that time.  Dg Chest Port 1 View 01/30/2016 Slight interval improvement in the appearance of the chest with nearly normal left lung. Persistent pleural effusion and subsegmental atelectasis in the right mid and lower lung. Stable cardiomegaly without significant pulmonary vascular congestion.   Dg Chest Port 1 View 01/22/2016 Right pleural effusion and likely underlying infiltrate. Left basilar atelectasis is noted.   Dg Abd 2 Views 01/29/2016 No acute findings.   Medical Consultants:  CTS   Other Consultants:  PT/OT  IAnti-Infectives:   Unasyn, change to Augmentin 3/30        Discharge Exam: Filed Vitals:   02/01/16 2118 02/02/16 0602  BP: 190/66 158/61  Pulse: 86 74  Temp: 97.9 F (36.6 C) 98.3 F (36.8 C)  Resp: 20 20   Filed Vitals:   02/01/16 0500 02/01/16 0827 02/01/16 2118 02/02/16 0602  BP:  170/89 190/66 158/61  Pulse:  88 86 74  Temp:   97.9 F (36.6 C) 98.3 F (36.8 C)  TempSrc:      Resp:   20 20  Height:      Weight: 75.9 kg (167 lb 5.3 oz)   76.2 kg (167 lb 15.9 oz)  SpO2:   98% 94%    General: Pt is alert, follows commands appropriately, not in acute distress Cardiovascular: Regular rate and rhythm, no rubs, no gallops Respiratory: Clear to auscultation  bilaterally, no wheezing, no crackles, no rhonchi Abdominal: Soft, non tender, non distended, bowel sounds +, no guarding Extremities: no cyanosis, pulses palpable bilaterally DP and PT  Discharge Instructions  Discharge Instructions    Diet - low sodium heart healthy    Complete by:  As directed      Increase activity slowly    Complete by:  As directed             Medication List    STOP taking these medications        silver sulfADIAZINE 1 % cream  Commonly known as:  SILVADENE      TAKE these medications        ALPRAZolam 0.25 MG tablet  Commonly known as:  XANAX  Take 1 tablet (0.25 mg total) by mouth 2 (two) times daily as needed for anxiety.     amoxicillin-clavulanate 875-125 MG tablet  Commonly known as:  AUGMENTIN  Take 1 tablet by mouth every 12 (twelve) hours.     apixaban 5 MG Tabs tablet  Commonly known as:  ELIQUIS  Take 2 tablets (10 mg total) by mouth 2 (two) times daily. Continue taking until April 6th, 2017. After that start taking Eliquis 5 mg tablet twice daily     apixaban 5 MG Tabs tablet  Commonly known as:  ELIQUIS  Take 1 tablet (5 mg total) by mouth 2 (two) times daily. Start taking April 7th, 2017  Start taking on:  02/09/2016     aspirin EC 81 MG tablet  Take 81 mg by mouth daily.     BLUE-EMU SUPER STRENGTH EX  Apply 1 application topically daily as needed (neck pain).     furosemide 20 MG tablet  Commonly known as:  LASIX  Take 10 mg by mouth daily.     glimepiride 1 MG tablet  Commonly known as:  AMARYL  Take 1 mg by mouth 2 (two) times daily.     HYDROcodone-acetaminophen 10-325 MG tablet  Commonly known as:  NORCO  Take 1 tablet by mouth every 8 (eight) hours as needed (pain).     Insulin Glargine 100 UNIT/ML Solostar Pen  Commonly known as:  LANTUS SOLOSTAR  Inject 10 Units into the skin at bedtime.     levalbuterol 0.63 MG/3ML nebulizer solution  Commonly known as:  XOPENEX  Take 3 mLs (0.63 mg total) by nebulization  every 6 (six) hours as needed for wheezing or shortness of breath.     levothyroxine 100 MCG tablet  Commonly known as:  SYNTHROID, LEVOTHROID  Take 100 mcg by mouth daily.     metoprolol tartrate 25 MG tablet  Commonly known as:  LOPRESSOR  Take 1 tablet (25 mg total) by mouth 2 (two) times daily.     neomycin-bacitracin-polymyxin Oint  Commonly known as:  NEOSPORIN  Apply 1 application topically daily as needed for wound care (foot ulcers).     pantoprazole 40 MG tablet  Commonly known as:  PROTONIX  Take 1 tablet (40 mg total) by mouth daily.     predniSONE 10 MG tablet  Commonly known as:  DELTASONE  Take 1 tablet (10 mg total) by mouth daily with breakfast.     simvastatin 20 MG tablet  Commonly known as:  ZOCOR  Take 20 mg by mouth daily.     zolpidem 5 MG tablet  Commonly known as:  AMBIEN  Take 1 tablet (5 mg total) by mouth at bedtime as needed for sleep.            Follow-up Information    Follow up with Alleen Borne, MD On 02/14/2016.   Specialty:  Cardiothoracic Surgery   Why:  PA/LAT CXR to be taken (at [facility] which is in the same building as Dr. Sharee Pimple office) on 02/14/2016 at 10:15 am;Appointment time is at 11:00 am   Contact information:   [address] [employer] [address] [place] [address] [phone]       Follow up with Nurse.   Why:  Appointment is with nurse only for chest tube suture removal only. Appointment time is at 10:30 am   Contact information:   [address] [employer] [address] [place] [address] [phone]      Follow up with [facility]   Specialty:  Home Health Services   Why:  Also known as [facility] for [facility] PT. They will in 1-2 days after discharge to set up first home visit   Contact information:   [address] [place] [address] [phone]  Follow up with Quentin Mulling, MD.   Specialty:  Geriatric Medicine   Contact information:   [address] [place]  [address] [phone]        The results of significant diagnostics from this hospitalization (including imaging, microbiology, ancillary and laboratory) are listed below for reference.     Microbiology: Recent Results (from the past 240 hour(s))  Culture, body fluid-bottle     Status: None   Collection Time: 01/23/16 12:49 PM  Result Value Ref Range Status   Specimen Description FLUID RIGHT PLEURAL  Final   Special Requests NONE  Final   Culture NO GROWTH 5 DAYS  Final   Report Status 01/28/2016 FINAL  Final  Gram stain     Status: None   Collection Time: 01/23/16 12:49 PM  Result Value Ref Range Status   Specimen Description FLUID RIGHT PLEURAL  Final   Special Requests NONE  Final   Gram Stain   Final    ABUNDANT WBC PRESENT,BOTH PMN AND MONONUCLEAR NO ORGANISMS SEEN    Report Status 01/23/2016 FINAL  Final  Fungus Culture With Stain (Not @ [facility])     Status: None (Preliminary result)   Collection Time: 01/26/16  3:10 PM  Result Value Ref Range Status   Fungus Stain Final report  Final    Comment: (NOTE) Performed At: [facility] [address] [place], [place] [id number] Mila Homer MD ZS:[id number]    Fungus (Mycology) Culture PENDING  Incomplete   Fungal Source FLUID  Final    Comment: PLEURAL RIGHT   Acid Fast Smear (AFB)     Status: None   Collection Time: 01/26/16  3:10 PM  Result Value Ref Range Status   AFB Specimen Processing Concentration  Final   Acid Fast Smear Negative  Final    Comment: (NOTE) Performed At: [facility] [address] [place], [place] [id number] Mila Homer MD [id number]    Source (AFB) FLUID  Final    Comment: PLEURAL RIGHT   Culture, body fluid-bottle     Status: None   Collection Time: 01/26/16  3:10 PM  Result Value Ref Range Status   Specimen Description FLUID PLEURAL RIGHT  Final   Special Requests BOTTLES DRAWN AEROBIC AND ANAEROBIC 10CC  Final   Culture NO GROWTH 5 DAYS  Final   Report  Status 01/31/2016 FINAL  Final  Gram stain     Status: None   Collection Time: 01/26/16  3:10 PM  Result Value Ref Range Status   Specimen Description FLUID PLEURAL RIGHT  Final   Special Requests NONE  Final   Gram Stain   Final    WBC PRESENT,BOTH PMN AND MONONUCLEAR NO ORGANISMS SEEN CYTOSPIN    Report Status 01/26/2016 FINAL  Final  Fungus Culture Result     Status: None   Collection Time: 01/26/16  3:10 PM  Result Value Ref Range Status   Result 1 Comment  Final    Comment: (NOTE) KOH/Calcofluor preparation:  no fungus observed. Performed At: [facility] [address] [place], [place] [id number] Mila Homer MD NM:[id number]   C difficile quick scan w PCR reflex     Status: None   Collection Time: 01/28/16  6:20 PM  Result Value Ref Range Status   C Diff antigen NEGATIVE NEGATIVE Final   C Diff toxin NEGATIVE NEGATIVE Final   C Diff interpretation Negative for toxigenic C. difficile  Final     Labs: Basic Metabolic Panel:  Recent  Labs Lab 01/29/16 0330 01/30/16 0324 01/31/16 0543 02/01/16 0625 02/02/16 0629  NA 137 137 140 141 139  K 3.6 4.5 4.3 4.0 3.8  CL 106 110 110 110 108  CO2 20* 20* 21* 22 21*  GLUCOSE 112* 133* 136* 127* 154*  BUN 10 10 7 9 12   CREATININE 1.27* 1.21* 1.10* 1.06* 1.11*  CALCIUM 8.3* 8.2* 8.6* 8.6* 8.7*  MG 2.1  --   --   --   --    Liver Function Tests:  Recent Labs Lab 01/27/16 0505 01/28/16 0245 01/29/16 0330 01/30/16 0324  AST 20 17 20 19   ALT 10* 9* 11* 10*  ALKPHOS 61 63 81 71  BILITOT 0.4 0.3 0.3 0.4  PROT 5.5* 5.3* 5.8* 5.4*  ALBUMIN 1.9* 1.8* 1.9* 1.8*   CBC:  Recent Labs Lab 01/29/16 0330 01/30/16 0324 01/31/16 0543 02/01/16 0625 02/02/16 0629  WBC 15.9* 11.9* 10.4 9.7 10.3  HGB 9.4* 8.6* 8.7* 8.4* 8.9*  HCT 31.1* 27.0* 29.3* 27.8* 27.8*  MCV 87.4 88.5 89.3 88.5 88.5  PLT 348 293 309 333 341   CBG:  Recent Labs Lab 02/01/16 0755 02/01/16 1149 02/01/16 1630 02/01/16 2115  02/02/16 0741  GLUCAP 131* 213* 262* 133* 127*   SIGNED: Time coordinating discharge:  30 minutes  MAGICK-Catarino Vold, MD  [facility] 02/02/2016, 11:22 AM Pager [phone]  If 7PM-7AM, please contact night-coverage [email or web address] Password [email or web address]

## 2016-02-02 NOTE — Progress Notes (Signed)
Kirsten Gallegos to be D/C'd Home per MD order. Discussed with the patient and all questions fully answered.    Medication List    STOP taking these medications        silver sulfADIAZINE 1 % cream  Commonly known as:  SILVADENE      TAKE these medications        ALPRAZolam 0.25 MG tablet  Commonly known as:  XANAX  Take 1 tablet (0.25 mg total) by mouth 2 (two) times daily as needed for anxiety.     amoxicillin-clavulanate 875-125 MG tablet  Commonly known as:  AUGMENTIN  Take 1 tablet by mouth every 12 (twelve) hours.     apixaban 5 MG Tabs tablet  Commonly known as:  ELIQUIS  Take 2 tablets (10 mg total) by mouth 2 (two) times daily. Continue taking until April 6th, 2017. After that start taking Eliquis 5 mg tablet twice daily     apixaban 5 MG Tabs tablet  Commonly known as:  ELIQUIS  Take 1 tablet (5 mg total) by mouth 2 (two) times daily. Start taking April 7th, 2017  Start taking on:  02/09/2016     aspirin EC 81 MG tablet  Take 81 mg by mouth daily.     BLUE-EMU SUPER STRENGTH EX  Apply 1 application topically daily as needed (neck pain).     furosemide 20 MG tablet  Commonly known as:  LASIX  Take 10 mg by mouth daily.     glimepiride 1 MG tablet  Commonly known as:  AMARYL  Take 1 mg by mouth 2 (two) times daily.     HYDROcodone-acetaminophen 10-325 MG tablet  Commonly known as:  NORCO  Take 1 tablet by mouth every 8 (eight) hours as needed (pain).     Insulin Glargine 100 UNIT/ML Solostar Pen  Commonly known as:  LANTUS SOLOSTAR  Inject 10 Units into the skin at bedtime.     levalbuterol 0.63 MG/3ML nebulizer solution  Commonly known as:  XOPENEX  Take 3 mLs (0.63 mg total) by nebulization every 6 (six) hours as needed for wheezing or shortness of breath.     levothyroxine 100 MCG tablet  Commonly known as:  SYNTHROID, LEVOTHROID  Take 100 mcg by mouth daily.     metoprolol tartrate 25 MG tablet  Commonly known as:  LOPRESSOR  Take 1 tablet (25 mg  total) by mouth 2 (two) times daily.     neomycin-bacitracin-polymyxin Oint  Commonly known as:  NEOSPORIN  Apply 1 application topically daily as needed for wound care (foot ulcers).     pantoprazole 40 MG tablet  Commonly known as:  PROTONIX  Take 1 tablet (40 mg total) by mouth daily.     predniSONE 10 MG tablet  Commonly known as:  DELTASONE  Take 1 tablet (10 mg total) by mouth daily with breakfast.     simvastatin 20 MG tablet  Commonly known as:  ZOCOR  Take 20 mg by mouth daily.     zolpidem 5 MG tablet  Commonly known as:  AMBIEN  Take 1 tablet (5 mg total) by mouth at bedtime as needed for sleep.        VVS, Skin clean, dry and intact without evidence of skin break down, no evidence of skin tears noted.  IV catheter discontinued intact. Site without signs and symptoms of complications. Dressing and pressure applied.  An After Visit Summary was printed and given to the patient.  Patient escorted via WC, and  D/C home via private auto.  Beckey Downing F  02/02/2016 1:47 PM

## 2016-02-09 ENCOUNTER — Ambulatory Visit (INDEPENDENT_AMBULATORY_CARE_PROVIDER_SITE_OTHER): Payer: Self-pay

## 2016-02-09 DIAGNOSIS — Z4802 Encounter for removal of sutures: Secondary | ICD-10-CM

## 2016-02-09 DIAGNOSIS — J90 Pleural effusion, not elsewhere classified: Secondary | ICD-10-CM

## 2016-02-09 NOTE — Progress Notes (Signed)
Removed 2 sutures from chest tube sites. No signs of infection and patient tolerated well. 

## 2016-02-20 ENCOUNTER — Other Ambulatory Visit: Payer: Self-pay | Admitting: Surgery

## 2016-02-20 DIAGNOSIS — J90 Pleural effusion, not elsewhere classified: Secondary | ICD-10-CM

## 2016-02-21 ENCOUNTER — Ambulatory Visit: Payer: Medicare (Managed Care) | Admitting: Surgery

## 2016-02-27 LAB — FUNGAL ORGANISM REFLEX

## 2016-02-27 LAB — FUNGUS CULTURE WITH STAIN

## 2016-02-27 LAB — FUNGUS CULTURE RESULT

## 2016-03-06 ENCOUNTER — Encounter: Payer: Self-pay | Admitting: Surgery

## 2016-03-06 ENCOUNTER — Ambulatory Visit (INDEPENDENT_AMBULATORY_CARE_PROVIDER_SITE_OTHER): Payer: Self-pay | Admitting: Surgery

## 2016-03-06 ENCOUNTER — Ambulatory Visit
Admission: RE | Admit: 2016-03-06 | Discharge: 2016-03-06 | Disposition: A | Discharge status: Home / Self Care | Payer: Medicare Other | Source: Ambulatory Visit | Attending: Surgery | Admitting: Surgery

## 2016-03-06 VITALS — BP 190/80 | HR 75 | Resp 20 | Ht 66.0 in | Wt 167.0 lb

## 2016-03-06 DIAGNOSIS — J948 Other specified pleural conditions: Secondary | ICD-10-CM

## 2016-03-06 DIAGNOSIS — J90 Pleural effusion, not elsewhere classified: Secondary | ICD-10-CM

## 2016-03-06 NOTE — Progress Notes (Signed)
HPI: Patient returns for routine postoperative follow-up having undergone right VATS for drainage of a loculated pleural effusion on 01/26/2016. She was noted to have a RLL segmental PE on her CT and had a vena cava filter placed preop so that she could remain off anticoagulation. The patient's early postoperative recovery while in the hospital was notable for a slow postop course. Since hospital discharge the patient reports that she was home about a week and then developed a large lower GI bleed requiring 5 units of blood and clipping of some bleeding sites at colonoscopy in the right side of the colon. She was taken off Eliquis and aspirin which had been started for her PE in the postop period by internal medicine. She says that she is now feeling well.   Current Outpatient Prescriptions  Medication Sig Dispense Refill  . ALPRAZolam (XANAX) 0.25 MG tablet Take 1 tablet (0.25 mg total) by mouth 2 (two) times daily as needed for anxiety. 30 tablet 0  . furosemide (LASIX) 20 MG tablet Take 10 mg by mouth daily as needed.   0  . glimepiride (AMARYL) 1 MG tablet Take 1 mg by mouth 2 (two) times daily.  0  . HYDROcodone-acetaminophen (NORCO) 10-325 MG tablet Take 1 tablet by mouth every 8 (eight) hours as needed (pain). 30 tablet 0  . Insulin Glargine (LANTUS SOLOSTAR) 100 UNIT/ML Solostar Pen Inject 10 Units into the skin at bedtime. 15 mL 11  . levothyroxine (SYNTHROID, LEVOTHROID) 100 MCG tablet Take 100 mcg by mouth daily.  0  . Liniments (BLUE-EMU SUPER STRENGTH EX) Apply 1 application topically daily as needed (neck pain).    . metoprolol (LOPRESSOR) 25 MG tablet Take 1 tablet (25 mg total) by mouth 2 (two) times daily. 60 tablet 0  . omeprazole (PRILOSEC) 20 MG capsule Take 20 mg by mouth daily.    . predniSONE (DELTASONE) 10 MG tablet Take 1 tablet (10 mg total) by mouth daily with breakfast. 30 tablet 0  . simvastatin (ZOCOR) 20 MG tablet Take 20 mg by mouth daily.    Marland Kitchen aspirin EC  81 MG tablet Take 81 mg by mouth daily. Reported on 03/06/2016    . levalbuterol (XOPENEX) 0.63 MG/3ML nebulizer solution Take 3 mLs (0.63 mg total) by nebulization every 6 (six) hours as needed for wheezing or shortness of breath. (Patient not taking: Reported on 03/06/2016) 500 mL 1  . pantoprazole (PROTONIX) 40 MG tablet Take 1 tablet (40 mg total) by mouth daily. (Patient not taking: Reported on 03/06/2016) 30 tablet 0  . zolpidem (AMBIEN) 5 MG tablet Take 1 tablet (5 mg total) by mouth at bedtime as needed for sleep. (Patient not taking: Reported on 03/06/2016) 30 tablet 0   No current facility-administered medications for this visit.    Physical Exam: BP 190/80 mmHg  Pulse 75  Resp 20  Ht 5\' 6"  (1.676 m)  Wt 167 lb (75.751 kg)  BMI 26.97 kg/m2  SpO2 97% She looks well. Lung exam is clear. Cardiac exam shows a regular rate and rhythm with normal heart sounds. Chest incisions are healing well.   Diagnostic Tests:  CLINICAL DATA: RIGHT pleural effusion, post VATS 01/26/2016, hypertension, MI  EXAM: CHEST 2 VIEW  COMPARISON: 01/30/2016  FINDINGS: Enlargement of cardiac silhouette.  Mediastinal contours and pulmonary vascularity normal.  Emphysematous and bronchitic changes compatible with COPD.  Bibasilar atelectasis greater on RIGHT.  Small RIGHT pleural effusion, decreased.  No new infiltrate or pneumothorax.  Bones  demineralized.  IMPRESSION: COPD changes with decreased RIGHT pleural effusion and basilar atelectasis since prior study.  Enlargement of cardiac silhouette with minimal LEFT subsegmental atelectasis LEFT lower lobe.   Electronically Signed  By: Ulyses Southward M.D.  On: 03/06/2016 14:20   Impression:  Overall I think she is making a good recovery from her chest surgery. She has questions about her vena cava filter and anticoagulation. Given her incidental PE and recent GI bleeding at age 80 I think it would be best to leave the  filter in and not put her on any anticoagulation. Venous duplex during her admission did not show any DVT and it is unclear how long the PE had been there.   Plan:  She will continue to follow up with her PCP and I will be happy to see her again if the need arises.   Alleen Borne, MD [facility] [phone]

## 2016-03-07 ENCOUNTER — Encounter: Payer: Self-pay | Admitting: Sports Medicine

## 2016-03-07 ENCOUNTER — Ambulatory Visit (INDEPENDENT_AMBULATORY_CARE_PROVIDER_SITE_OTHER): Payer: Medicare Other | Admitting: Sports Medicine

## 2016-03-07 DIAGNOSIS — M79671 Pain in right foot: Secondary | ICD-10-CM

## 2016-03-07 DIAGNOSIS — E1142 Type 2 diabetes mellitus with diabetic polyneuropathy: Secondary | ICD-10-CM

## 2016-03-07 DIAGNOSIS — L03119 Cellulitis of unspecified part of limb: Secondary | ICD-10-CM

## 2016-03-07 DIAGNOSIS — B351 Tinea unguium: Secondary | ICD-10-CM

## 2016-03-07 DIAGNOSIS — L89891 Pressure ulcer of other site, stage 1: Secondary | ICD-10-CM | POA: Diagnosis not present

## 2016-03-07 DIAGNOSIS — M79676 Pain in unspecified toe(s): Secondary | ICD-10-CM | POA: Diagnosis not present

## 2016-03-07 DIAGNOSIS — L02619 Cutaneous abscess of unspecified foot: Secondary | ICD-10-CM

## 2016-03-07 DIAGNOSIS — L97512 Non-pressure chronic ulcer of other part of right foot with fat layer exposed: Secondary | ICD-10-CM

## 2016-03-07 MED ORDER — SILVER SULFADIAZINE 1 % EX CREA: 1.0000 "application " | TOPICAL_CREAM | Freq: Every day | CUTANEOUS | Status: AC

## 2016-03-07 MED ORDER — DOXYCYCLINE HYCLATE 100 MG PO TABS: 100.0000 mg | ORAL_TABLET | Freq: Two times a day (BID) | ORAL | Status: DC

## 2016-03-07 NOTE — Progress Notes (Signed)
Patient ID: Kirsten Gallegos, female   DOB: 15-Apr-1936, 80 y.o.   MRN: [id number]  Subjective: Kirsten Gallegos is a 80 y.o. female patient with history of type 2 diabetes who returns to office today for follow up evaluation of right foot ulcer, Patient states that the glucose reading this morning was down from before. Patient has been started on insulin since discharge from hospital patient was admitted for about 20 days for right pleural effusion. Patient reports that she is doing much better now except with pain in her neck, of which she is to go for evaluation next week with a specialist. Patient denies any other pedal complaints or concerns at this time.   Patient Active Problem List   Diagnosis Date Noted  . Encounter for chest tube removal   . Emesis   . DM type 2 (diabetes mellitus, type 2) ([place]) 01/28/2016  . Pressure ulcer 01/23/2016  . Pulmonary embolism with acute cor pulmonale ([place])   . Pleural effusion on right   . Shortness of breath   . Hypoxemia   . Pulmonary embolism ([place]) 01/22/2016  . Pleural effusion   . Pericardial effusion   . Rheumatoid arthritis with positive rheumatoid factor (HCC)   . Mycotic toenails 07/23/2013   Current Outpatient Prescriptions on File Prior to Visit  Medication Sig Dispense Refill  . ALPRAZolam (XANAX) 0.25 MG tablet Take 1 tablet (0.25 mg total) by mouth 2 (two) times daily as needed for anxiety. 30 tablet 0  . aspirin EC 81 MG tablet Take 81 mg by mouth daily. Reported on 03/06/2016    . furosemide (LASIX) 20 MG tablet Take 10 mg by mouth daily as needed.   0  . glimepiride (AMARYL) 1 MG tablet Take 1 mg by mouth 2 (two) times daily.  0  . HYDROcodone-acetaminophen (NORCO) 10-325 MG tablet Take 1 tablet by mouth every 8 (eight) hours as needed (pain). 30 tablet 0  . Insulin Glargine (LANTUS SOLOSTAR) 100 UNIT/ML Solostar Pen Inject 10 Units into the skin at bedtime. 15 mL 11  . levalbuterol (XOPENEX) 0.63 MG/3ML nebulizer solution Take 3 mLs  (0.63 mg total) by nebulization every 6 (six) hours as needed for wheezing or shortness of breath. (Patient not taking: Reported on 03/06/2016) 500 mL 1  . levothyroxine (SYNTHROID, LEVOTHROID) 100 MCG tablet Take 100 mcg by mouth daily.  0  . Liniments (BLUE-EMU SUPER STRENGTH EX) Apply 1 application topically daily as needed (neck pain).    . metoprolol (LOPRESSOR) 25 MG tablet Take 1 tablet (25 mg total) by mouth 2 (two) times daily. 60 tablet 0  . omeprazole (PRILOSEC) 20 MG capsule Take 20 mg by mouth daily.    . pantoprazole (PROTONIX) 40 MG tablet Take 1 tablet (40 mg total) by mouth daily. (Patient not taking: Reported on 03/06/2016) 30 tablet 0  . predniSONE (DELTASONE) 10 MG tablet Take 1 tablet (10 mg total) by mouth daily with breakfast. 30 tablet 0  . simvastatin (ZOCOR) 20 MG tablet Take 20 mg by mouth daily.    Marland Kitchen zolpidem (AMBIEN) 5 MG tablet Take 1 tablet (5 mg total) by mouth at bedtime as needed for sleep. (Patient not taking: Reported on 03/06/2016) 30 tablet 0   No current facility-administered medications on file prior to visit.   Allergies  Allergen Reactions  . Cephalexin Other (See Comments)    Pt states since she had meningitis, she not able to take antibiotic, but did not give reactions.   . Codeine Nausea And  Vomiting    Objective: General: Patient is awake, alert, and oriented x 3 and in no acute distress.  Integument: Skin is warm, dry and supple bilateral. Nails are Mildly elongated and thickened, consistent with onychomycosis, 1-5 bilateral.   Plantar right forefoot Full thickness ulcer sub met 2, with creamy drainage expressed after removal of overlying callus area with wound bed appearing 10:90 fibro-granular in nature that measures post debridement 1.5x1x0.5cm (last measurement 0.8 x 0.2x0.2cm) with no probing, no undermining, no tracking. Mild focal erythema to site. No odor.  No other acute signs of infection.   Posterior right heel , Resolved   Lateral fifth  metatarsophalangeal joint, right foot, resolved  Vasculature:  Dorsalis Pedis pulse 1/4 bilateral. Posterior Tibial pulse  1/4 bilateral.  Capillary fill time <3 sec 1-5 bilateral. Scant hair growth to the level of the digits.Temperature gradient within normal limits. + varicosities present bilateral. +1 pitting edema present bilateral.   Neurology: The patient has intact sensation measured with a 5.07/10g Semmes Weinstein Monofilament at all pedal sites bilateral. Vibratory sensation diminished bilateral with tuning fork. No Babinski sign present bilateral.   Musculoskeletal: Asymptomatic bunion and hammertoes noted bilateral. Muscular strength 5/5 in all lower extremity muscular groups bilateral without pain or limitation on range of motion . No tenderness with calf compression bilateral.  Assessment and Plan: Problem List Items Addressed This Visit    None    Visit Diagnoses    Right foot ulcer, with fat layer exposed ([place])    -  Primary    Relevant Medications    silver sulfADIAZINE (SILVADENE) 1 % cream    Other Relevant Orders    Wound culture    Cellulitis and abscess of foot, except toes        Relevant Medications    doxycycline (VIBRA-TABS) 100 MG tablet    Other Relevant Orders    Wound culture    Diabetic polyneuropathy associated with type 2 diabetes mellitus (HCC)        Relevant Orders    Wound culture    Right foot pain        Relevant Orders    Wound culture    Pain due to onychomycosis of toenail          -Examined patient. -Discussed and educated patient on diabetic foot care, especially with  regards to the vascular, neurological and musculoskeletal systems.  -Stressed the importance of good glycemic control and the detriment of not  controlling glucose levels in relation to the foot. -Nails 10 Mechanically debrided using sterile nail nipper and filed without incident -Mechanically debrided ulceration Plantar second metatarsal right foot, using a sterile  chisel blade without incident; wound culture obtained; dressed area with offloading pad, silvadene, 2x2,  secured with Ace wrap; patient to have son to do the same at home.  -Prescribed doxycycline to take as instructed -Recommend to return to post op shoe right foot with felt padding and limit ambulation to necessity -Recommend elevation of legs to assist with edema control  -Answered all patient questions -Patient to return in 1week for continued wound care or sooner if condition worsens.  -Patient advised to call the office or go to ER if the area worsens or if any problems arise.   Landis Martins, DPM

## 2016-03-11 LAB — ACID FAST CULTURE WITH REFLEXED SENSITIVITIES (MYCOBACTERIA)

## 2016-03-11 LAB — ACID FAST CULTURE WITH REFLEXED SENSITIVITIES: ACID FAST CULTURE - AFSCU3: NEGATIVE

## 2016-03-14 ENCOUNTER — Ambulatory Visit (INDEPENDENT_AMBULATORY_CARE_PROVIDER_SITE_OTHER): Payer: Medicare Other | Admitting: Sports Medicine

## 2016-03-14 ENCOUNTER — Encounter: Payer: Self-pay | Admitting: Sports Medicine

## 2016-03-14 DIAGNOSIS — E1142 Type 2 diabetes mellitus with diabetic polyneuropathy: Secondary | ICD-10-CM

## 2016-03-14 DIAGNOSIS — L02619 Cutaneous abscess of unspecified foot: Secondary | ICD-10-CM | POA: Diagnosis not present

## 2016-03-14 DIAGNOSIS — L03119 Cellulitis of unspecified part of limb: Secondary | ICD-10-CM

## 2016-03-14 DIAGNOSIS — L84 Corns and callosities: Secondary | ICD-10-CM | POA: Diagnosis not present

## 2016-03-14 NOTE — Progress Notes (Addendum)
Patient ID: Kirsten Gallegos, female   DOB: 1936-09-15, 80 y.o.   MRN: [id number]  Subjective: Kirsten Gallegos is a 80 y.o. female patient with history of type 2 diabetes who returns to office today for follow up evaluation of right foot ulcer, Patient states that the glucose reading is doing better. Reports that she has been tolerating the doxycycline antibiotic ok; has a few more days left before completion. Patient denies any other pedal complaints or concerns at this time.   Patient Active Problem List   Diagnosis Date Noted  . Encounter for chest tube removal   . Emesis   . DM type 2 (diabetes mellitus, type 2) ([place]) 01/28/2016  . Pressure ulcer 01/23/2016  . Pulmonary embolism with acute cor pulmonale ([place])   . Pleural effusion on right   . Shortness of breath   . Hypoxemia   . Pulmonary embolism ([place]) 01/22/2016  . Pleural effusion   . Pericardial effusion   . Rheumatoid arthritis with positive rheumatoid factor (HCC)   . Mycotic toenails 07/23/2013   Current Outpatient Prescriptions on File Prior to Visit  Medication Sig Dispense Refill  . ALPRAZolam (XANAX) 0.25 MG tablet Take 1 tablet (0.25 mg total) by mouth 2 (two) times daily as needed for anxiety. 30 tablet 0  . aspirin EC 81 MG tablet Take 81 mg by mouth daily. Reported on 03/06/2016    . doxycycline (VIBRA-TABS) 100 MG tablet Take 1 tablet (100 mg total) by mouth 2 (two) times daily. 20 tablet 0  . furosemide (LASIX) 20 MG tablet Take 10 mg by mouth daily as needed.   0  . glimepiride (AMARYL) 1 MG tablet Take 1 mg by mouth 2 (two) times daily.  0  . HYDROcodone-acetaminophen (NORCO) 10-325 MG tablet Take 1 tablet by mouth every 8 (eight) hours as needed (pain). 30 tablet 0  . Insulin Glargine (LANTUS SOLOSTAR) 100 UNIT/ML Solostar Pen Inject 10 Units into the skin at bedtime. 15 mL 11  . levalbuterol (XOPENEX) 0.63 MG/3ML nebulizer solution Take 3 mLs (0.63 mg total) by nebulization every 6 (six) hours as needed for  wheezing or shortness of breath. (Patient not taking: Reported on 03/06/2016) 500 mL 1  . levothyroxine (SYNTHROID, LEVOTHROID) 100 MCG tablet Take 100 mcg by mouth daily.  0  . Liniments (BLUE-EMU SUPER STRENGTH EX) Apply 1 application topically daily as needed (neck pain).    . metoprolol (LOPRESSOR) 25 MG tablet Take 1 tablet (25 mg total) by mouth 2 (two) times daily. 60 tablet 0  . omeprazole (PRILOSEC) 20 MG capsule Take 20 mg by mouth daily.    . pantoprazole (PROTONIX) 40 MG tablet Take 1 tablet (40 mg total) by mouth daily. (Patient not taking: Reported on 03/06/2016) 30 tablet 0  . predniSONE (DELTASONE) 10 MG tablet Take 1 tablet (10 mg total) by mouth daily with breakfast. 30 tablet 0  . silver sulfADIAZINE (SILVADENE) 1 % cream Apply 1 application topically daily. To right foot ulcer 50 g 0  . simvastatin (ZOCOR) 20 MG tablet Take 20 mg by mouth daily.    Marland Kitchen zolpidem (AMBIEN) 5 MG tablet Take 1 tablet (5 mg total) by mouth at bedtime as needed for sleep. (Patient not taking: Reported on 03/06/2016) 30 tablet 0   No current facility-administered medications on file prior to visit.   Allergies  Allergen Reactions  . Cephalexin Other (See Comments)    Pt states since she had meningitis, she not able to take antibiotic, but did not  give reactions.   . Codeine Nausea And Vomiting    Objective: General: Patient is awake, alert, and oriented x 3 and in no acute distress.  Integument: Skin is warm, dry and supple bilateral. Nails are Short and thickened, consistent with onychomycosis, 1-5 bilateral.   Plantar right forefoot sub met 2, previous area of ulceration has completely healed over and is resolved.  No signs of infection.   Posterior right heel , Resolved   Lateral fifth metatarsophalangeal joint, right foot, Ulceration, resolved however there is mild erythema to site likely pre-ulcerative in nature.   Vasculature:  Dorsalis Pedis pulse 1/4 bilateral. Posterior Tibial pulse  1/4  bilateral.  Capillary fill time <3 sec 1-5 bilateral. Scant hair growth to the level of the digits.Temperature gradient within normal limits. + varicosities present bilateral. +1 pitting edema present bilateral.   Neurology: The patient has intact sensation measured with a 5.07/10g Semmes Weinstein Monofilament at all pedal sites bilateral. Vibratory sensation diminished bilateral with tuning fork. No Babinski sign present bilateral.   Musculoskeletal: Asymptomatic bunion and hammertoes noted bilateral. Muscular strength 5/5 in all lower extremity muscular groups bilateral without pain or limitation on range of motion . No tenderness with calf compression bilateral.  Wound culture- normal flora  Assessment and Plan: Problem List Items Addressed This Visit    None    Visit Diagnoses    Pre-ulcerative calluses    -  Primary    Right sub met 2 and lateral 5th MTPJ    Diabetic polyneuropathy associated with type 2 diabetes mellitus (HCC)        Cellulitis and abscess of foot, except toes        Resolved      -Examined patient. -Discussed and educated patient on diabetic foot care, especially with  regards to the vascular, neurological and musculoskeletal systems.  -Stressed the importance of good glycemic control and the detriment of not  controlling glucose levels in relation to the foot. -Applied offloading pad, sub-met 2 and lateral fifth metatarsophalangeal joint, right foot. Instructed patient to continue daily with these offloading pad to prevent further skin breakdown or re-ulceration -Dispensed surgi tube compression stocking to help control edema and encouraged lower limb elevation -Patient may return to normal shoe extra-depth good supportive -Continue with doxycycline until completed -Answered all patient questions -Patient to return in 2 weeks for recheck of pre-ulcerative sites or sooner if condition worsens.  -Patient advised to call the office or go to ER if the area  worsens or if any problems arise.   Landis Martins, DPM

## 2016-03-28 ENCOUNTER — Ambulatory Visit: Payer: Medicare Other | Admitting: Sports Medicine

## 2016-04-03 ENCOUNTER — Ambulatory Visit (INDEPENDENT_AMBULATORY_CARE_PROVIDER_SITE_OTHER): Payer: Medicare Other | Admitting: Sports Medicine

## 2016-04-03 ENCOUNTER — Encounter: Payer: Self-pay | Admitting: Sports Medicine

## 2016-04-03 DIAGNOSIS — L84 Corns and callosities: Secondary | ICD-10-CM

## 2016-04-03 DIAGNOSIS — M79671 Pain in right foot: Secondary | ICD-10-CM

## 2016-04-03 DIAGNOSIS — E1142 Type 2 diabetes mellitus with diabetic polyneuropathy: Secondary | ICD-10-CM | POA: Diagnosis not present

## 2016-04-03 MED ORDER — HYDROCODONE-ACETAMINOPHEN 10-325 MG PO TABS: 1.0000 | ORAL_TABLET | Freq: Three times a day (TID) | ORAL | Status: DC | PRN

## 2016-04-03 NOTE — Progress Notes (Signed)
Patient ID: Kirsten Gallegos, female   DOB: 04-23-1936, 80 y.o.   MRN: [id number] Subjective: Kirsten Gallegos is a 80 y.o. female patient with history of type 2 diabetes who returns to office today for follow up evaluation of right foot ulcer, Patient states that the glucose reading is ok; reports was in hospital for pancreatitis that she thinks was caused from the doxycycline. States that after she finished the course of doxycycline about 3 days later started to develop abdominal pain which was diagnoses pancreatitis; was admitted for 6 days for this. Patient denies any other pedal complaints or concerns at this time.  Patient Active Problem List   Diagnosis Date Noted  . Encounter for chest tube removal   . Emesis   . DM type 2 (diabetes mellitus, type 2) ([place]) 01/28/2016  . Pressure ulcer 01/23/2016  . Pulmonary embolism with acute cor pulmonale ([place])   . Pleural effusion on right   . Shortness of breath   . Hypoxemia   . Pulmonary embolism ([place]) 01/22/2016  . Pleural effusion   . Pericardial effusion   . Rheumatoid arthritis with positive rheumatoid factor (HCC)   . Mycotic toenails 07/23/2013   Current Outpatient Prescriptions on File Prior to Visit  Medication Sig Dispense Refill  . ALPRAZolam (XANAX) 0.25 MG tablet Take 1 tablet (0.25 mg total) by mouth 2 (two) times daily as needed for anxiety. 30 tablet 0  . aspirin EC 81 MG tablet Take 81 mg by mouth daily. Reported on 03/06/2016    . furosemide (LASIX) 20 MG tablet Take 10 mg by mouth daily as needed.   0  . glimepiride (AMARYL) 1 MG tablet Take 1 mg by mouth 2 (two) times daily.  0  . Insulin Glargine (LANTUS SOLOSTAR) 100 UNIT/ML Solostar Pen Inject 10 Units into the skin at bedtime. 15 mL 11  . levalbuterol (XOPENEX) 0.63 MG/3ML nebulizer solution Take 3 mLs (0.63 mg total) by nebulization every 6 (six) hours as needed for wheezing or shortness of breath. (Patient not taking: Reported on 03/06/2016) 500 mL 1  . levothyroxine  (SYNTHROID, LEVOTHROID) 100 MCG tablet Take 100 mcg by mouth daily.  0  . Liniments (BLUE-EMU SUPER STRENGTH EX) Apply 1 application topically daily as needed (neck pain).    . metoprolol (LOPRESSOR) 25 MG tablet Take 1 tablet (25 mg total) by mouth 2 (two) times daily. 60 tablet 0  . omeprazole (PRILOSEC) 20 MG capsule Take 20 mg by mouth daily.    . pantoprazole (PROTONIX) 40 MG tablet Take 1 tablet (40 mg total) by mouth daily. (Patient not taking: Reported on 03/06/2016) 30 tablet 0  . predniSONE (DELTASONE) 10 MG tablet Take 1 tablet (10 mg total) by mouth daily with breakfast. 30 tablet 0  . silver sulfADIAZINE (SILVADENE) 1 % cream Apply 1 application topically daily. To right foot ulcer 50 g 0  . simvastatin (ZOCOR) 20 MG tablet Take 20 mg by mouth daily.    Marland Kitchen zolpidem (AMBIEN) 5 MG tablet Take 1 tablet (5 mg total) by mouth at bedtime as needed for sleep. (Patient not taking: Reported on 03/06/2016) 30 tablet 0   No current facility-administered medications on file prior to visit.   Allergies  Allergen Reactions  . Doxycycline     Pancreatitis with nausea  . Cephalexin Other (See Comments)    Pt states since she had meningitis, she not able to take antibiotic, but did not give reactions.   . Codeine Nausea And Vomiting  Objective: General: Patient is awake, alert, and oriented x 3 and in no acute distress.  Integument: Skin is warm, dry and supple bilateral. Nails are Short and thickened, consistent with onychomycosis, 1-5 bilateral.   Previous ulcerated areas continue to be resolved at: Plantar right forefoot sub met 2, previous area of ulceration has completely healed over and is resolved.  No signs of infection.   Posterior right heel , Resolved   Lateral fifth metatarsophalangeal joint, right foot, Ulceration, resolved however there is mild erythema to site likely pre-ulcerative in nature.   Vasculature:  Dorsalis Pedis pulse 1/4 bilateral. Posterior Tibial pulse  1/4  bilateral.  Capillary fill time <3 sec 1-5 bilateral. Scant hair growth to the level of the digits.Temperature gradient within normal limits. + varicosities present bilateral. +1 pitting edema present bilateral.   Neurology: The patient has intact sensation measured with a 5.07/10g Semmes Weinstein Monofilament at all pedal sites bilateral. Vibratory sensation diminished bilateral with tuning fork. No Babinski sign present bilateral.   Musculoskeletal: Minimal tenderness to pre-ulcerative callus areas right foot.  Asymptomatic bunion and hammertoes noted bilateral. Muscular strength 5/5 in all lower extremity muscular groups bilateral without pain or limitation on range of motion . No tenderness with calf compression bilateral.  Assessment and Plan: Problem List Items Addressed This Visit    None    Visit Diagnoses    Pre-ulcerative calluses    -  Primary    Diabetic polyneuropathy associated with type 2 diabetes mellitus (HCC)        Right foot pain        Relevant Medications    HYDROcodone-acetaminophen (NORCO) 10-325 MG tablet      -Examined patient. -Discussed and educated patient on diabetic foot care, especially with  regards to the vascular, neurological and musculoskeletal systems.  -Stressed the importance of good glycemic control and the detriment of not  controlling glucose levels in relation to the foot. -Applied offloading pad, sub-met 2 and lateral fifth metatarsophalangeal joint, right foot. Instructed patient to continue daily with these offloading pad to prevent further skin breakdown or re-ulceration Will consider discontinuing these pads at next visit if doing well -Patient to continue with extra-depth good supportive -Prescribed Norco to take as needed for pain -Answered all patient questions -Patient to return in 4 weeks for recheck of pre-ulcerative sites or sooner if condition worsens.  -Patient advised to call the office or go to ER if the area worsens or if any  problems arise.   Landis Martins, DPM

## 2016-04-04 ENCOUNTER — Ambulatory Visit: Payer: Medicare Other | Admitting: Sports Medicine

## 2016-04-25 ENCOUNTER — Encounter: Payer: Self-pay | Admitting: Sports Medicine

## 2016-04-25 ENCOUNTER — Ambulatory Visit (INDEPENDENT_AMBULATORY_CARE_PROVIDER_SITE_OTHER): Payer: Medicare Other | Admitting: Sports Medicine

## 2016-04-25 DIAGNOSIS — E1142 Type 2 diabetes mellitus with diabetic polyneuropathy: Secondary | ICD-10-CM | POA: Diagnosis not present

## 2016-04-25 DIAGNOSIS — M79671 Pain in right foot: Secondary | ICD-10-CM

## 2016-04-25 DIAGNOSIS — M1000 Idiopathic gout, unspecified site: Secondary | ICD-10-CM | POA: Diagnosis not present

## 2016-04-25 DIAGNOSIS — L84 Corns and callosities: Secondary | ICD-10-CM

## 2016-04-25 MED ORDER — HYDROCODONE-ACETAMINOPHEN 10-325 MG PO TABS: 1.0000 | ORAL_TABLET | Freq: Three times a day (TID) | ORAL | Status: AC | PRN

## 2016-04-25 MED ORDER — TRIAMCINOLONE ACETONIDE 10 MG/ML IJ SUSP: 10.0000 mg | Freq: Once | INTRAMUSCULAR | Status: AC

## 2016-04-25 NOTE — Progress Notes (Signed)
Patient ID: Madissen Wyse, female   DOB: 1936/11/03, 80 y.o.   MRN: [id number]  Subjective: Jerilynn Feldmeier is a 80 y.o. female patient with history of type 2 diabetes who presents to office today complaining of redness, warmth, pain, swelling, since a few days ago at the right second toe. Denies history of gout or constitutional symptoms. Admits that her callus feels fine does not hurt and remains closed and that her son has been using the offloading pads at the area with no recurrence of ulceration. Thus, that is why patient became concerned about swelling and redness to her foot due to the fact that these areas are not open any more. Patient denies any dietary changes or family history of gout; patient does report history of feeling frail and dehydrated. Patient denies any other pedal complaints or concerns at this time.  Patient Active Problem List   Diagnosis Date Noted  . Encounter for chest tube removal   . Emesis   . DM type 2 (diabetes mellitus, type 2) ([place]) 01/28/2016  . Pressure ulcer 01/23/2016  . Pulmonary embolism with acute cor pulmonale ([place])   . Pleural effusion on right   . Shortness of breath   . Hypoxemia   . Pulmonary embolism ([place]) 01/22/2016  . Pleural effusion   . Pericardial effusion   . Rheumatoid arthritis with positive rheumatoid factor (HCC)   . Mycotic toenails 07/23/2013   Current Outpatient Prescriptions on File Prior to Visit  Medication Sig Dispense Refill  . ALPRAZolam (XANAX) 0.25 MG tablet Take 1 tablet (0.25 mg total) by mouth 2 (two) times daily as needed for anxiety. 30 tablet 0  . aspirin EC 81 MG tablet Take 81 mg by mouth daily. Reported on 03/06/2016    . furosemide (LASIX) 20 MG tablet Take 10 mg by mouth daily as needed.   0  . glimepiride (AMARYL) 1 MG tablet Take 1 mg by mouth 2 (two) times daily.  0  . Insulin Glargine (LANTUS SOLOSTAR) 100 UNIT/ML Solostar Pen Inject 10 Units into the skin at bedtime. 15 mL 11  . levalbuterol (XOPENEX) 0.63  MG/3ML nebulizer solution Take 3 mLs (0.63 mg total) by nebulization every 6 (six) hours as needed for wheezing or shortness of breath. (Patient not taking: Reported on 03/06/2016) 500 mL 1  . levothyroxine (SYNTHROID, LEVOTHROID) 100 MCG tablet Take 100 mcg by mouth daily.  0  . Liniments (BLUE-EMU SUPER STRENGTH EX) Apply 1 application topically daily as needed (neck pain).    . metoprolol (LOPRESSOR) 25 MG tablet Take 1 tablet (25 mg total) by mouth 2 (two) times daily. 60 tablet 0  . omeprazole (PRILOSEC) 20 MG capsule Take 20 mg by mouth daily.    . pantoprazole (PROTONIX) 40 MG tablet Take 1 tablet (40 mg total) by mouth daily. (Patient not taking: Reported on 03/06/2016) 30 tablet 0  . predniSONE (DELTASONE) 10 MG tablet Take 1 tablet (10 mg total) by mouth daily with breakfast. 30 tablet 0  . silver sulfADIAZINE (SILVADENE) 1 % cream Apply 1 application topically daily. To right foot ulcer 50 g 0  . simvastatin (ZOCOR) 20 MG tablet Take 20 mg by mouth daily.    Marland Kitchen zolpidem (AMBIEN) 5 MG tablet Take 1 tablet (5 mg total) by mouth at bedtime as needed for sleep. (Patient not taking: Reported on 03/06/2016) 30 tablet 0   No current facility-administered medications on file prior to visit.   Allergies  Allergen Reactions  . Doxycycline  Pancreatitis with nausea  . Cephalexin Other (See Comments)    Pt states since she had meningitis, she not able to take antibiotic, but did not give reactions.   . Codeine Nausea And Vomiting    Objective: General: Patient is awake, alert, and oriented x 3 and in no acute distress.  Integument: Skin is warm, dry and supple bilateral. Nails are Short and thickened, consistent with onychomycosis, 1-5 bilateral.   Previous ulcerated areas continue to be resolved at: Plantar right forefoot sub met 2, previous area of ulceration has completely healed over and is resolved.  No signs of infection.   Posterior right heel , Resolved   Lateral fifth  metatarsophalangeal joint, right foot, Ulceration, resolved,pre-ulcerative in nature.   2. The dorsum of the right foot. There is significant soft tissue swelling and edema, erythema, warmth to the second metatarsophalangeal joint concerning of gout that is painful even with light touch  Vasculature:  Dorsalis Pedis pulse 1/4 bilateral. Posterior Tibial pulse  1/4 bilateral.  Capillary fill time <3 sec 1-5 bilateral. Scant hair growth to the level of the digits.Temperature gradient within normal limits. + varicosities present bilateral. +1 pitting edema present bilateral.   Neurology: The patient has intact sensation measured with a 5.07/10g Semmes Weinstein Monofilament at all pedal sites bilateral. Vibratory sensation diminished bilateral with tuning fork. No Babinski sign present bilateral.   Musculoskeletal: Extreme tenderness to palpation at the right second metatarsophalangeal joint, Minimal tenderness to pre-ulcerative callus areas right foot.  Asymptomatic bunion and hammertoes noted bilateral. Muscular strength within normal limits. No tenderness with calf compression bilateral.  Assessment and Plan: Problem List Items Addressed This Visit    None    Visit Diagnoses    Acute idiopathic gout, unspecified site    -  Primary    Relevant Medications    triamcinolone acetonide (KENALOG) 10 MG/ML injection 10 mg    Other Relevant Orders    Basic metabolic panel    CBC    Uric acid    Sedimentation rate    C-reactive protein    Pre-ulcerative calluses        Diabetic polyneuropathy associated with type 2 diabetes mellitus (HCC)        Right foot pain        Relevant Medications    HYDROcodone-acetaminophen (NORCO) 10-325 MG tablet      -Examined patient. -Discussed and educated patient on diabetic foot care, especially with  regards to the vascular, neurological and musculoskeletal systems.  -Stressed the importance of good glycemic control and the detriment of not  controlling  glucose levels in relation to the foot. -For possible gout capsule patient on possible causes and for symptomatic relief,  After oral consent and aseptic prep, injected a mixture containing 1 ml of 2% plain lidocaine, 1 ml 0.5% plain marcaine, 0.5 ml of kenalog 10 and 0.5 ml of dexamethasone phosphate into right second metatarsophalangeal joint without complication. Post-injection care discussed with patient.  -Ordered arthritic panel and blood work to check for possible gout; will call patient with results -Mechanically debrided callus sub-met 2. Right foot was sterile chisel blade to ensure that there was no underlying opening. Applied offloading pad, sub-met 2 and lateral fifth metatarsophalangeal joint, right foot. Instructed patient to continue daily with these offloading pad to prevent further skin breakdown or re-ulceration; Will consider discontinuing these pads at next visit if doing well or with minimal callus -Patient to continue with extra-depth good supportive -Prescribed Norco to take as needed  for pain -Answered all patient questions -Patient to return in 2 weeks for recheck of gout and pre-ulcerative sites or sooner if condition worsens.  -Patient advised to call the office or go to ER if the area worsens or if any problems arise.   Landis Martins, DPM

## 2016-04-27 LAB — CBC
HEMATOCRIT: 35.5 % (ref 34.0–46.6)
Hemoglobin: 11.3 g/dL (ref 11.1–15.9)
MCH: 27.1 pg (ref 26.6–33.0)
MCHC: 31.8 g/dL (ref 31.5–35.7)
MCV: 85 fL (ref 79–97)
Platelets: 269 10*3/uL (ref 150–379)
RBC: 4.17 x10E6/uL (ref 3.77–5.28)
RDW: 16.3 % — AB (ref 12.3–15.4)
WBC: 10.8 10*3/uL (ref 3.4–10.8)

## 2016-04-27 LAB — BASIC METABOLIC PANEL
BUN / CREAT RATIO: 20 (ref 12–28)
BUN: 19 mg/dL (ref 8–27)
CO2: 21 mmol/L (ref 18–29)
CREATININE: 0.97 mg/dL (ref 0.57–1.00)
Calcium: 9.6 mg/dL (ref 8.7–10.3)
Chloride: 95 mmol/L — ABNORMAL LOW (ref 96–106)
GFR, EST AFRICAN AMERICAN: 65 mL/min/{1.73_m2} (ref >59)
GFR, EST NON AFRICAN AMERICAN: 57 mL/min/{1.73_m2} — AB (ref >59)
Glucose: 369 mg/dL — ABNORMAL HIGH (ref 65–99)
Potassium: 4.5 mmol/L (ref 3.5–5.2)
Sodium: 135 mmol/L (ref 134–144)

## 2016-04-27 LAB — SEDIMENTATION RATE: Sed Rate: 64 mm/hr — ABNORMAL HIGH (ref 0–40)

## 2016-04-27 LAB — URIC ACID: Uric Acid: 6.5 mg/dL (ref 2.5–7.1)

## 2016-04-27 LAB — C-REACTIVE PROTEIN: CRP: 37.4 mg/L — AB (ref 0.0–4.9)

## 2016-04-29 ENCOUNTER — Telehealth: Payer: Self-pay | Admitting: Sports Medicine

## 2016-04-29 NOTE — Telephone Encounter (Signed)
Called patient to review labs and check on her to see how her right foot was doing after steroid shot for possible gout. Patient did not answer phone and there was no voicemail to leave a message. For elevated inflammatory markers ESR and CRP Recommend to prophylactically start Augmentin 893m bid x 14 days if her foot is still red and swollen. If her symptoms are resolved will continue with close monitoring and follow up as scheduled in office. Will attempt to call patient again. -Dr. SCannon Kettle

## 2016-04-30 ENCOUNTER — Telehealth: Payer: Self-pay | Admitting: *Deleted

## 2016-04-30 NOTE — Telephone Encounter (Signed)
Thanks for the update! -Dr. Rosalba Totty 

## 2016-04-30 NOTE — Telephone Encounter (Addendum)
-----  Message from Titorya Stover, DPM sent at 04/29/2016 12:27 PM EDT ----- Regarding: Call patient later today I attempted to call her. If you can try to call her again for me later in the day.  "Called patient to review labs and check on her to see how her right foot was doing after steroid shot for possible gout. Patient did not answer phone and there was no voicemail to leave a message. For elevated inflammatory markers ESR and CRP Recommend to prophylactically start Augmentin 875mg bid x 14 days if her foot is still red and swollen. If her symptoms are resolved will continue with close monitoring and follow up as scheduled in office."   Thanks  Dr. Stover.04/30/2016-I informed pt of Dr. Stover's review of blood work and recommendation for the Augmentin.  Pt states the area is getting better slowly, less red and swollen than before and she'd like to give it another week.  Pt states she's afraid to start a medication she's not familiar with.  I told pt I would inform Dr. Stover of her status and concerns, and to please call me on 05/02/2016 to let me know her status and call sooner if problems.  Pt states understanding. 

## 2016-05-01 ENCOUNTER — Ambulatory Visit: Payer: Medicare Other | Admitting: Sports Medicine

## 2016-05-02 ENCOUNTER — Encounter: Payer: Self-pay | Admitting: Sports Medicine

## 2016-05-02 ENCOUNTER — Ambulatory Visit: Payer: Medicare Other

## 2016-05-02 ENCOUNTER — Ambulatory Visit (INDEPENDENT_AMBULATORY_CARE_PROVIDER_SITE_OTHER): Payer: Medicare Other | Admitting: Sports Medicine

## 2016-05-02 DIAGNOSIS — M79674 Pain in right toe(s): Secondary | ICD-10-CM | POA: Diagnosis not present

## 2016-05-02 DIAGNOSIS — L84 Corns and callosities: Secondary | ICD-10-CM | POA: Diagnosis not present

## 2016-05-02 DIAGNOSIS — M79671 Pain in right foot: Secondary | ICD-10-CM | POA: Diagnosis not present

## 2016-05-02 DIAGNOSIS — E1142 Type 2 diabetes mellitus with diabetic polyneuropathy: Secondary | ICD-10-CM | POA: Diagnosis not present

## 2016-05-02 NOTE — Progress Notes (Signed)
Patient ID: Kirsten Gallegos, female   DOB: 06/13/1936, 80 y.o.   MRN: [id number] Subjective: Kirsten Gallegos is a 80 y.o. female patient with history of type 2 diabetes who presents to office today complaining of pain at right second toe; reports that redness and swelling is getting better but pain is still there. Denies  constitutional symptoms. Patient denies any other pedal complaints or concerns at this time.  Patient Active Problem List   Diagnosis Date Noted  . Encounter for chest tube removal   . Emesis   . DM type 2 (diabetes mellitus, type 2) (HCC) 01/28/2016  . Pressure ulcer 01/23/2016  . Pulmonary embolism with acute cor pulmonale (HCC)   . Pleural effusion on right   . Shortness of breath   . Hypoxemia   . Pulmonary embolism (HCC) 01/22/2016  . Pleural effusion   . Pericardial effusion   . Rheumatoid arthritis with positive rheumatoid factor (HCC)   . Mycotic toenails 07/23/2013   Current Outpatient Prescriptions on File Prior to Visit  Medication Sig Dispense Refill  . ALPRAZolam (XANAX) 0.25 MG tablet Take 1 tablet (0.25 mg total) by mouth 2 (two) times daily as needed for anxiety. 30 tablet 0  . aspirin EC 81 MG tablet Take 81 mg by mouth daily. Reported on 03/06/2016    . furosemide (LASIX) 20 MG tablet Take 10 mg by mouth daily as needed.   0  . glimepiride (AMARYL) 1 MG tablet Take 1 mg by mouth 2 (two) times daily.  0  . HYDROcodone-acetaminophen (NORCO) 10-325 MG tablet Take 1 tablet by mouth every 8 (eight) hours as needed (pain). 15 tablet 0  . Insulin Glargine (LANTUS SOLOSTAR) 100 UNIT/ML Solostar Pen Inject 10 Units into the skin at bedtime. 15 mL 11  . levalbuterol (XOPENEX) 0.63 MG/3ML nebulizer solution Take 3 mLs (0.63 mg total) by nebulization every 6 (six) hours as needed for wheezing or shortness of breath. (Patient not taking: Reported on 03/06/2016) 500 mL 1  . levothyroxine (SYNTHROID, LEVOTHROID) 100 MCG tablet Take 100 mcg by mouth daily.  0  . Liniments  (BLUE-EMU SUPER STRENGTH EX) Apply 1 application topically daily as needed (neck pain).    . metoprolol (LOPRESSOR) 25 MG tablet Take 1 tablet (25 mg total) by mouth 2 (two) times daily. 60 tablet 0  . omeprazole (PRILOSEC) 20 MG capsule Take 20 mg by mouth daily.    . pantoprazole (PROTONIX) 40 MG tablet Take 1 tablet (40 mg total) by mouth daily. (Patient not taking: Reported on 03/06/2016) 30 tablet 0  . predniSONE (DELTASONE) 10 MG tablet Take 1 tablet (10 mg total) by mouth daily with breakfast. 30 tablet 0  . silver sulfADIAZINE (SILVADENE) 1 % cream Apply 1 application topically daily. To right foot ulcer 50 g 0  . simvastatin (ZOCOR) 20 MG tablet Take 20 mg by mouth daily.    . zolpidem (AMBIEN) 5 MG tablet Take 1 tablet (5 mg total) by mouth at bedtime as needed for sleep. (Patient not taking: Reported on 03/06/2016) 30 tablet 0   Current Facility-Administered Medications on File Prior to Visit  Medication Dose Route Frequency Provider Last Rate Last Dose  . triamcinolone acetonide (KENALOG) 10 MG/ML injection 10 mg  10 mg Other Once Titorya Stover, DPM       Allergies  Allergen Reactions  . Doxycycline     Pancreatitis with nausea  . Cephalexin Other (See Comments)    Pt states since she had meningitis, she not able   to take antibiotic, but did not give reactions.   . Codeine Nausea And Vomiting    Objective: General: Patient is awake, alert, and oriented x 3 and in no acute distress.  Integument: Skin is warm, dry and supple bilateral. Nails are Short and thickened, consistent with onychomycosis, 1-5 bilateral.   Previous ulcerated areas continue to be resolved at: Plantar right forefoot sub met 2, previous area of ulceration has completely healed over and is resolved.  No signs of infection.   Posterior right heel , Resolved   Lateral fifth metatarsophalangeal joint, right foot, Ulceration, resolved,pre-ulcerative in nature.   To the dorsum of the right foot. There is  significant soft tissue swelling and edema, improved erythema, warmth to the second metatarsophalangeal joint concerning of fracture now since gout has been ruled out and its still painful to touch with bruising from injection from last week  Vasculature:  Dorsalis Pedis pulse 1/4 bilateral. Posterior Tibial pulse  1/4 bilateral.  Capillary fill time <3 sec 1-5 bilateral. Scant hair growth to the level of the digits.Temperature gradient within normal limits. + varicosities present bilateral. +1 pitting edema present bilateral.   Neurology: The patient has intact sensation measured with a 5.07/10g Semmes Weinstein Monofilament at all pedal sites bilateral. Vibratory sensation diminished bilateral with tuning fork. No Babinski sign present bilateral.   Musculoskeletal:Moderate tenderness to palpation at the right second metatarsophalangeal joint, Minimal tenderness to pre-ulcerative callus areas right foot.  Asymptomatic bunion and hammertoes noted bilateral. Muscular strength within normal limits. No tenderness with calf compression bilateral.  Xray diffuse osseous mineralization, no fracture, hammertoe, screw intact, osteoarthritis.   Assessment and Plan: Problem List Items Addressed This Visit    None    Visit Diagnoses    Right foot pain    -  Primary    Relevant Orders    DG Foot 2 Views Right    Diabetic polyneuropathy associated with type 2 diabetes mellitus (HCC)        Pre-ulcerative calluses        Pain of toe of right foot        2nd      -Examined patient. -xray reviewed -Discussed and educated patient on diabetic foot care, especially with  regards to the vascular, neurological and musculoskeletal systems.  -Stressed the importance of good glycemic control and the detriment of not  controlling glucose levels in relation to the foot. -Recommend ice, elevation, surgitube compression which was dispensed to patient -CRP and ESR elevated encouraged patient to start Augmentin;  reports that she still wants to wait because she has a lot of reactions to medications -Patient to continue with extra-depth post op shoe -Cont with Norco to take as needed for pain -Answered all patient questions -Patient to return in 1 week for recheck or sooner if condition worsens.  -Patient advised to call the office or go to ER if the area worsens or if any problems arise.   Landis Martins, DPM

## 2016-05-09 ENCOUNTER — Ambulatory Visit (INDEPENDENT_AMBULATORY_CARE_PROVIDER_SITE_OTHER): Payer: Medicare Other | Admitting: Sports Medicine

## 2016-05-09 ENCOUNTER — Encounter: Payer: Self-pay | Admitting: Sports Medicine

## 2016-05-09 DIAGNOSIS — L89891 Pressure ulcer of other site, stage 1: Secondary | ICD-10-CM | POA: Diagnosis not present

## 2016-05-09 DIAGNOSIS — L84 Corns and callosities: Secondary | ICD-10-CM

## 2016-05-09 DIAGNOSIS — M1000 Idiopathic gout, unspecified site: Secondary | ICD-10-CM

## 2016-05-09 DIAGNOSIS — M79671 Pain in right foot: Secondary | ICD-10-CM

## 2016-05-09 DIAGNOSIS — M79674 Pain in right toe(s): Secondary | ICD-10-CM

## 2016-05-09 DIAGNOSIS — L97511 Non-pressure chronic ulcer of other part of right foot limited to breakdown of skin: Secondary | ICD-10-CM

## 2016-05-09 DIAGNOSIS — E1142 Type 2 diabetes mellitus with diabetic polyneuropathy: Secondary | ICD-10-CM

## 2016-05-09 DIAGNOSIS — B351 Tinea unguium: Secondary | ICD-10-CM

## 2016-05-09 DIAGNOSIS — M79675 Pain in left toe(s): Secondary | ICD-10-CM | POA: Diagnosis not present

## 2016-05-09 DIAGNOSIS — L02619 Cutaneous abscess of unspecified foot: Secondary | ICD-10-CM

## 2016-05-09 DIAGNOSIS — L03119 Cellulitis of unspecified part of limb: Secondary | ICD-10-CM

## 2016-05-09 MED ORDER — CADEXOMER IODINE 0.9 % EX GEL: 1.0000 "application " | Freq: Every day | CUTANEOUS | Status: AC | PRN

## 2016-05-09 NOTE — Progress Notes (Signed)
Patient ID: Kirsten Gallegos, female   DOB: Mar 31, 1936, 80 y.o.   MRN: [id number]   Subjective: Kirsten Gallegos is a 80 y.o. female patient with history of type 2 diabetes who returns to office today for follow-up evaluation of right foot pain and plantar callus; reports that redness and swelling and pain to Second toe is getting better. Patient has son at home, assisting with offloading the areas with U shape pad dressings and assisting Patient with icing. Denies  constitutional symptoms. Patient denies any other pedal complaints or concerns at this time.  Patient Active Problem List   Diagnosis Date Noted  . Encounter for chest tube removal   . Emesis   . DM type 2 (diabetes mellitus, type 2) ([place]) 01/28/2016  . Pressure ulcer 01/23/2016  . Pulmonary embolism with acute cor pulmonale ([place])   . Pleural effusion on right   . Shortness of breath   . Hypoxemia   . Pulmonary embolism ([place]) 01/22/2016  . Pleural effusion   . Pericardial effusion   . Rheumatoid arthritis with positive rheumatoid factor (HCC)   . Mycotic toenails 07/23/2013   Current Outpatient Prescriptions on File Prior to Visit  Medication Sig Dispense Refill  . ALPRAZolam (XANAX) 0.25 MG tablet Take 1 tablet (0.25 mg total) by mouth 2 (two) times daily as needed for anxiety. 30 tablet 0  . aspirin EC 81 MG tablet Take 81 mg by mouth daily. Reported on 03/06/2016    . furosemide (LASIX) 20 MG tablet Take 10 mg by mouth daily as needed.   0  . glimepiride (AMARYL) 1 MG tablet Take 1 mg by mouth 2 (two) times daily.  0  . HYDROcodone-acetaminophen (NORCO) 10-325 MG tablet Take 1 tablet by mouth every 8 (eight) hours as needed (pain). 15 tablet 0  . Insulin Glargine (LANTUS SOLOSTAR) 100 UNIT/ML Solostar Pen Inject 10 Units into the skin at bedtime. 15 mL 11  . levalbuterol (XOPENEX) 0.63 MG/3ML nebulizer solution Take 3 mLs (0.63 mg total) by nebulization every 6 (six) hours as needed for wheezing or shortness of breath. (Patient  not taking: Reported on 03/06/2016) 500 mL 1  . levothyroxine (SYNTHROID, LEVOTHROID) 100 MCG tablet Take 100 mcg by mouth daily.  0  . Liniments (BLUE-EMU SUPER STRENGTH EX) Apply 1 application topically daily as needed (neck pain).    . metoprolol (LOPRESSOR) 25 MG tablet Take 1 tablet (25 mg total) by mouth 2 (two) times daily. 60 tablet 0  . omeprazole (PRILOSEC) 20 MG capsule Take 20 mg by mouth daily.    . pantoprazole (PROTONIX) 40 MG tablet Take 1 tablet (40 mg total) by mouth daily. (Patient not taking: Reported on 03/06/2016) 30 tablet 0  . predniSONE (DELTASONE) 10 MG tablet Take 1 tablet (10 mg total) by mouth daily with breakfast. 30 tablet 0  . silver sulfADIAZINE (SILVADENE) 1 % cream Apply 1 application topically daily. To right foot ulcer 50 g 0  . simvastatin (ZOCOR) 20 MG tablet Take 20 mg by mouth daily.    Marland Kitchen zolpidem (AMBIEN) 5 MG tablet Take 1 tablet (5 mg total) by mouth at bedtime as needed for sleep. (Patient not taking: Reported on 03/06/2016) 30 tablet 0   Current Facility-Administered Medications on File Prior to Visit  Medication Dose Route Frequency Provider Last Rate Last Dose  . triamcinolone acetonide (KENALOG) 10 MG/ML injection 10 mg  10 mg Other Once Landis Martins, DPM       Allergies  Allergen Reactions  . Doxycycline  Pancreatitis with nausea  . Cephalexin Other (See Comments)    Pt states since she had meningitis, she not able to take antibiotic, but did not give reactions.   . Codeine Nausea And Vomiting    Objective: General: Patient is awake, alert, and oriented x 3 and in no acute distress.  Integument: Skin is warm, dry and supple bilateral. Nails are Mildly elongated and thickened, consistent with onychomycosis, 1-5 bilateral.   There is callus once pared reveal a small opening that measures 0.3 x 0.3 cm right forefoot plantar second metatarsal with mild serous drainage and surrounding maceration. Advised patient need for antibiotics. However,  patient continues to decline. No other acute signs of infection  Posterior right heel , Resolved   Lateral fifth metatarsophalangeal joint, right foot, Ulceration, resolved,pre-ulcerative in nature.   To the dorsum of the right foot. There is decreased soft tissue swelling and edema, improved erythema and ecchymosis, no warmth to the second metatarsophalangeal joint.  Vasculature:  Dorsalis Pedis pulse 1/4 bilateral. Posterior Tibial pulse  1/4 bilateral.  Capillary fill time <3 sec 1-5 bilateral. Scant hair growth to the level of the digits.Temperature gradient within normal limits. + varicosities present bilateral. +1 pitting edema present bilateral.   Neurology: The patient has intact sensation measured with a 5.07/10g Semmes Weinstein Monofilament at all pedal sites bilateral. Vibratory sensation diminished bilateral with tuning fork. No Babinski sign present bilateral.   Musculoskeletal: Decreased tenderness to palpation at the right second metatarsophalangeal joint, Minimal tenderness to pre-ulcerative callus areas right foot. No pain to ulcerated area sub-met 2, Right foot. Asymptomatic bunion and hammertoes noted bilateral. Muscular strength within normal limits. No tenderness with calf compression bilateral.  Assessment and Plan: Problem List Items Addressed This Visit    None    Visit Diagnoses    Right foot pain    -  Primary    Diabetic polyneuropathy associated with type 2 diabetes mellitus (HCC)        Right foot ulcer, limited to breakdown of skin ([place])        sub met 2 at area of previous callus    Relevant Medications    cadexomer iodine (IODOSORB) 0.9 % gel    Pain of toe of right foot        Acute idiopathic gout, unspecified site        resolved    Cellulitis and abscess of foot, except toes        resolved    Onychomycosis          -Examined patient. -Discussed and educated patient on diabetic foot care, especially with  regards to the vascular, neurological  and musculoskeletal systems.  -Stressed the importance of good glycemic control and the detriment of not  controlling glucose levels in relation to the foot. -Mechanically debrided callus once pared revealed open ulceration, applied Iodosorb, U-shaped offloading pad and Band-Aid to area. Advised patient to have son to do the same daily at home; prescribed to patient Iodosorb to use to area -Recommend continue with ice, elevation, surgitube compression stocking of which she has from last visit -CRP and ESR elevated encouraged patient to start Augmentin; reports that she still wants to wait because she has a lot of reactions to medications continues to decline antibiotics -Mechanically debrided nails 10 without incident -Patient to continue with extra-depth shoes, bilateral. -Cont with Norco to take as needed for pain -Answered all patient questions -Patient to return in 1 week for recheck ulceration site or sooner  if condition worsens.  -Patient advised to call the office or go to ER if the area worsens or if any problems arise.   Landis Martins, DPM

## 2016-05-16 ENCOUNTER — Ambulatory Visit (INDEPENDENT_AMBULATORY_CARE_PROVIDER_SITE_OTHER): Payer: Medicare Other | Admitting: Sports Medicine

## 2016-05-16 ENCOUNTER — Ambulatory Visit: Payer: Medicare Other | Admitting: Sports Medicine

## 2016-05-16 DIAGNOSIS — E1142 Type 2 diabetes mellitus with diabetic polyneuropathy: Secondary | ICD-10-CM

## 2016-05-16 DIAGNOSIS — L97511 Non-pressure chronic ulcer of other part of right foot limited to breakdown of skin: Secondary | ICD-10-CM

## 2016-05-16 DIAGNOSIS — M79671 Pain in right foot: Secondary | ICD-10-CM

## 2016-05-16 NOTE — Progress Notes (Signed)
Patient ID: Kirsten Gallegos, female   DOB: [birth year], 80 y.o.   MRN: [id number]  Subjective: Kirsten Gallegos is a 80 y.o. female patient with history of type 2 diabetes who returns to office today for follow-up evaluation of right foot pain and plantar ulcer at area of previous callus; reports that redness and swelling and pain to Second toe is better. Patient has son at home, assisting with offloading the areas with U shape pad and applying Iodosorb cream dressings. Denies  constitutional symptoms. Patient denies any other pedal complaints or concerns at this time.  Patient was late to today's appointment due to running behind with her primary care appointment.   Patient Active Problem List   Diagnosis Date Noted  . Encounter for chest tube removal   . Emesis   . DM type 2 (diabetes mellitus, type 2) ([place]) 01/28/2016  . Pressure ulcer 01/23/2016  . Pulmonary embolism with acute cor pulmonale ([place])   . Pleural effusion on right   . Shortness of breath   . Hypoxemia   . Pulmonary embolism ([place]) 01/22/2016  . Pleural effusion   . Pericardial effusion   . Rheumatoid arthritis with positive rheumatoid factor (HCC)   . Mycotic toenails 07/23/2013   Current Outpatient Prescriptions on File Prior to Visit  Medication Sig Dispense Refill  . ALPRAZolam (XANAX) 0.25 MG tablet Take 1 tablet (0.25 mg total) by mouth 2 (two) times daily as needed for anxiety. 30 tablet 0  . aspirin EC 81 MG tablet Take 81 mg by mouth daily. Reported on 03/06/2016    . cadexomer iodine (IODOSORB) 0.9 % gel Apply 1 application topically daily as needed for wound care. Right foot bottom at open area 40 g 1  . furosemide (LASIX) 20 MG tablet Take 10 mg by mouth daily as needed.   0  . glimepiride (AMARYL) 1 MG tablet Take 1 mg by mouth 2 (two) times daily.  0  . HYDROcodone-acetaminophen (NORCO) 10-325 MG tablet Take 1 tablet by mouth every 8 (eight) hours as needed (pain). 15 tablet 0  . Insulin Glargine (LANTUS  SOLOSTAR) 100 UNIT/ML Solostar Pen Inject 10 Units into the skin at bedtime. 15 mL 11  . levalbuterol (XOPENEX) 0.63 MG/3ML nebulizer solution Take 3 mLs (0.63 mg total) by nebulization every 6 (six) hours as needed for wheezing or shortness of breath. (Patient not taking: Reported on 03/06/2016) 500 mL 1  . levothyroxine (SYNTHROID, LEVOTHROID) 100 MCG tablet Take 100 mcg by mouth daily.  0  . Liniments (BLUE-EMU SUPER STRENGTH EX) Apply 1 application topically daily as needed (neck pain).    . metoprolol (LOPRESSOR) 25 MG tablet Take 1 tablet (25 mg total) by mouth 2 (two) times daily. 60 tablet 0  . omeprazole (PRILOSEC) 20 MG capsule Take 20 mg by mouth daily.    . pantoprazole (PROTONIX) 40 MG tablet Take 1 tablet (40 mg total) by mouth daily. (Patient not taking: Reported on 03/06/2016) 30 tablet 0  . predniSONE (DELTASONE) 10 MG tablet Take 1 tablet (10 mg total) by mouth daily with breakfast. 30 tablet 0  . silver sulfADIAZINE (SILVADENE) 1 % cream Apply 1 application topically daily. To right foot ulcer 50 g 0  . simvastatin (ZOCOR) 20 MG tablet Take 20 mg by mouth daily.    Marland Kitchen zolpidem (AMBIEN) 5 MG tablet Take 1 tablet (5 mg total) by mouth at bedtime as needed for sleep. (Patient not taking: Reported on 03/06/2016) 30 tablet 0   Current Facility-Administered Medications  on File Prior to Visit  Medication Dose Route Frequency Provider Last Rate Last Dose  . triamcinolone acetonide (KENALOG) 10 MG/ML injection 10 mg  10 mg Other Once Landis Martins, DPM       Allergies  Allergen Reactions  . Doxycycline     Pancreatitis with nausea  . Cephalexin Other (See Comments)    Pt states since she had meningitis, she not able to take antibiotic, but did not give reactions.   . Codeine Nausea And Vomiting    Objective: General: Patient is awake, alert, and oriented x 3 and in no acute distress.  Integument: Skin is warm, dry and supple bilateral. Nails are Mildly elongated and thickened,  consistent with onychomycosis, 1-5 bilateral.   There is pre-ulcerative callus sub-met 2, Right foot. No other acute signs of infection  Posterior right heel , Resoinsulin, no polys.oot, Ulceration, resolved,pre-ulcerative in nature.   To the dorsum of the right foot. There is decreased soft tissue swelling and edema, improved erythema and ecchymosis, no warmth to the second metatarsophalangeal joint.  Vasculature:  Dorsalis Pedis pulse 1/4 bilateral. Posterior Tibial pulse  1/4 bilateral.  Capillary fill time <3 sec 1-5 bilateral. Scant hair growth to the level of the digits.Temperature gradient within normal limits. + varicosities present bilateral. +1 pitting edema present bilateral.   Neurology: The patient has intact sensation measured with a 5.07/10g Semmes Weinstein Monofilament at all pedal sites bilateral. Vibratory sensation diminished bilateral with tuning fork. No Babinski sign present bilateral.   Musculoskeletal: Decreased tenderness to palpation at the right second metatarsophalangeal joint, Minimal tenderness to pre-ulcerative callus areas right foot. Asymptomatic bunion and hammertoes noted bilateral. Muscular strength within normal limits. No tenderness with calf compression bilateral.  Assessment and Plan: Problem List Items Addressed This Visit    None    Visit Diagnoses    Right foot pain    -  Primary    Right foot ulcer, limited to breakdown of skin ([place])        healed now pre-ulcerative    Diabetic polyneuropathy associated with type 2 diabetes mellitus ([place])          -Examined patient. -Discussed and educated patient on diabetic foot care, especially with  regards to the vascular, neurological and musculoskeletal systems.  -Stressed the importance of good glycemic control and the detriment of not  controlling glucose levels in relation to the foot. -Applied to right plantar foot offloading pad and Band-Aid to area. Advised patient to have son to do the same  daily at home -Recommend continue with ice, elevation, surgitube compression stocking -Patient to continue with extra-depth shoes, bilateral. -Answered all  patient questions -Patient to return in 2 weeks for recheck pre-ulceration site or sooner if condition worsens.  -Patient advised to call the office or go to ER if the area worsens or if any problems arise.   Landis Martins, DPM

## 2016-05-23 ENCOUNTER — Ambulatory Visit: Payer: Medicare Other | Admitting: Sports Medicine

## 2016-05-30 ENCOUNTER — Encounter: Payer: Self-pay | Admitting: Sports Medicine

## 2016-05-30 ENCOUNTER — Ambulatory Visit (INDEPENDENT_AMBULATORY_CARE_PROVIDER_SITE_OTHER): Payer: Medicare Other | Admitting: Sports Medicine

## 2016-05-30 DIAGNOSIS — M79671 Pain in right foot: Secondary | ICD-10-CM | POA: Diagnosis not present

## 2016-05-30 DIAGNOSIS — L89891 Pressure ulcer of other site, stage 1: Secondary | ICD-10-CM | POA: Diagnosis not present

## 2016-05-30 DIAGNOSIS — E1142 Type 2 diabetes mellitus with diabetic polyneuropathy: Secondary | ICD-10-CM

## 2016-05-30 DIAGNOSIS — L97511 Non-pressure chronic ulcer of other part of right foot limited to breakdown of skin: Secondary | ICD-10-CM

## 2016-05-30 NOTE — Progress Notes (Signed)
Patient ID: Kirsten Gallegos, female   DOB: 1936/07/28, 80 y.o.   MRN: [id number]  Subjective: Kirsten Gallegos is a 80 y.o. female patient with history of type 2 diabetes who returns to office today for follow-up evaluation of right foot pain and plantar callus at area of previous ulceration; reports that foot feels better; less pain and swelling at right 2nd toe. Patient has son at home, assisting with offloading the areas with U shape pad and applying Iodosorb cream dressings. Denies constitutional symptoms. Patient denies any other pedal complaints or concerns at this time.   Patient Active Problem List   Diagnosis Date Noted  . Encounter for chest tube removal   . Emesis   . DM type 2 (diabetes mellitus, type 2) ([place]) 01/28/2016  . Pressure ulcer 01/23/2016  . Pulmonary embolism with acute cor pulmonale ([place])   . Pleural effusion on right   . Shortness of breath   . Hypoxemia   . Pulmonary embolism ([place]) 01/22/2016  . Pleural effusion   . Pericardial effusion   . Rheumatoid arthritis with positive rheumatoid factor (HCC)   . Mycotic toenails 07/23/2013   Current Outpatient Prescriptions on File Prior to Visit  Medication Sig Dispense Refill  . ALPRAZolam (XANAX) 0.25 MG tablet Take 1 tablet (0.25 mg total) by mouth 2 (two) times daily as needed for anxiety. 30 tablet 0  . aspirin EC 81 MG tablet Take 81 mg by mouth daily. Reported on 03/06/2016    . cadexomer iodine (IODOSORB) 0.9 % gel Apply 1 application topically daily as needed for wound care. Right foot bottom at open area 40 g 1  . furosemide (LASIX) 20 MG tablet Take 10 mg by mouth daily as needed.   0  . glimepiride (AMARYL) 1 MG tablet Take 1 mg by mouth 2 (two) times daily.  0  . HYDROcodone-acetaminophen (NORCO) 10-325 MG tablet Take 1 tablet by mouth every 8 (eight) hours as needed (pain). 15 tablet 0  . Insulin Glargine (LANTUS SOLOSTAR) 100 UNIT/ML Solostar Pen Inject 10 Units into the skin at bedtime. 15 mL 11  .  levalbuterol (XOPENEX) 0.63 MG/3ML nebulizer solution Take 3 mLs (0.63 mg total) by nebulization every 6 (six) hours as needed for wheezing or shortness of breath. (Patient not taking: Reported on 03/06/2016) 500 mL 1  . levothyroxine (SYNTHROID, LEVOTHROID) 100 MCG tablet Take 100 mcg by mouth daily.  0  . Liniments (BLUE-EMU SUPER STRENGTH EX) Apply 1 application topically daily as needed (neck pain).    . metoprolol (LOPRESSOR) 25 MG tablet Take 1 tablet (25 mg total) by mouth 2 (two) times daily. 60 tablet 0  . omeprazole (PRILOSEC) 20 MG capsule Take 20 mg by mouth daily.    . pantoprazole (PROTONIX) 40 MG tablet Take 1 tablet (40 mg total) by mouth daily. (Patient not taking: Reported on 03/06/2016) 30 tablet 0  . predniSONE (DELTASONE) 10 MG tablet Take 1 tablet (10 mg total) by mouth daily with breakfast. 30 tablet 0  . silver sulfADIAZINE (SILVADENE) 1 % cream Apply 1 application topically daily. To right foot ulcer 50 g 0  . simvastatin (ZOCOR) 20 MG tablet Take 20 mg by mouth daily.    Marland Kitchen zolpidem (AMBIEN) 5 MG tablet Take 1 tablet (5 mg total) by mouth at bedtime as needed for sleep. (Patient not taking: Reported on 03/06/2016) 30 tablet 0   Current Facility-Administered Medications on File Prior to Visit  Medication Dose Route Frequency Provider Last Rate Last Dose  .  triamcinolone acetonide (KENALOG) 10 MG/ML injection 10 mg  10 mg Other Once Landis Martins, DPM       Allergies  Allergen Reactions  . Doxycycline     Pancreatitis with nausea  . Cephalexin Other (See Comments)    Pt states since she had meningitis, she not able to take antibiotic, but did not give reactions.   . Codeine Nausea And Vomiting    Objective: General: Patient is awake, alert, and oriented x 3 and in no acute distress.  Integument: Skin is warm, dry and supple bilateral. Nails are short and thickened, consistent with onychomycosis, 1-5 bilateral.   There is a small opening with granular base at the area of  callus that measures less than 0.1 cm with no depth sub-met 2, Right foot with no surrounding signs of infection. No active drainage, no erythema, no malodor.  To the dorsum of the right foot. There is decreased soft tissue swelling and edema, improved erythema and ecchymosis, no warmth to the second metatarsophalangeal joint.  Vasculature:  Dorsalis Pedis pulse 1/4 bilateral. Posterior Tibial pulse  1/4 bilateral. Capillary fill time <3 sec 1-5 bilateral. Scant hair growth to the level of the digits.Temperature gradient within normal limits. + varicosities present bilateral. +1 pitting edema present bilateral.   Neurology: The patient has intact sensation measured with a 5.07/10g Semmes Weinstein Monofilament at all pedal sites bilateral. Vibratory sensation diminished bilateral with tuning fork. No Babinski sign present bilateral.   Musculoskeletal: Decreased tenderness to palpation at the right second metatarsophalangeal joint, Minimal tenderness to ulceration site sub met 2 right foot. Asymptomatic bunion and hammertoes noted bilateral. Muscular strength within normal limits. No tenderness with calf compression bilateral.  Assessment and Plan: Problem List Items Addressed This Visit    None    Visit Diagnoses    Right foot pain    -  Primary   Right foot ulcer, limited to breakdown of skin ([place])       Diabetic polyneuropathy associated with type 2 diabetes mellitus ([place])         -Examined patient. -Discussed and educated patient on diabetic foot care, especially with  regards to the vascular, neurological and musculoskeletal systems.  -Stressed the importance of good glycemic control and the detriment of not  controlling glucose levels in relation to the foot. -Excisionally debrided ulceration using sterile chisel blade and Applied to right sub met 2 ulcer site offloading pad, topical antibiotic cream and Band-Aid to area. Advised patient to have son to do the same daily at  home -Recommend continue with ice, elevation, surgitube compression stocking for edema control -Patient to continue with extra-depth shoes, bilateral. -Answered all  patient questions -Patient to return in 1-2 weeks for recheck ulceration site or sooner if condition worsens.  -Patient advised to call the office or go to ER if the area worsens or if any problems arise.   Landis Martins, DPM

## 2016-06-06 ENCOUNTER — Encounter (INDEPENDENT_AMBULATORY_CARE_PROVIDER_SITE_OTHER): Payer: Self-pay

## 2016-06-06 ENCOUNTER — Encounter: Payer: Self-pay | Admitting: Sports Medicine

## 2016-06-06 ENCOUNTER — Ambulatory Visit (INDEPENDENT_AMBULATORY_CARE_PROVIDER_SITE_OTHER): Payer: Medicare Other | Admitting: Sports Medicine

## 2016-06-06 DIAGNOSIS — E1142 Type 2 diabetes mellitus with diabetic polyneuropathy: Secondary | ICD-10-CM | POA: Diagnosis not present

## 2016-06-06 DIAGNOSIS — M79671 Pain in right foot: Secondary | ICD-10-CM

## 2016-06-06 DIAGNOSIS — L97511 Non-pressure chronic ulcer of other part of right foot limited to breakdown of skin: Secondary | ICD-10-CM | POA: Diagnosis not present

## 2016-06-06 NOTE — Progress Notes (Signed)
Patient ID: Kirsten Gallegos, female   DOB: 05/15/1936, 80 y.o.   MRN: [id number]  Subjective: Kirsten Gallegos is a 80 y.o. female patient with history of type 2 diabetes who returns to office today for follow-up evaluation of right foot pain at area of ulceration; reports no pain. Patient has son at home, assisting with offloading the areas with oval shape pad and applying Iodosorb dressings. Denies constitutional symptoms. Patient denies any other pedal complaints or concerns at this time.   Patient Active Problem List   Diagnosis Date Noted  . Encounter for chest tube removal   . Emesis   . DM type 2 (diabetes mellitus, type 2) ([place]) 01/28/2016  . Pressure ulcer 01/23/2016  . Pulmonary embolism with acute cor pulmonale ([place])   . Pleural effusion on right   . Shortness of breath   . Hypoxemia   . Pulmonary embolism ([place]) 01/22/2016  . Pleural effusion   . Pericardial effusion   . Rheumatoid arthritis with positive rheumatoid factor (HCC)   . Mycotic toenails 07/23/2013   Current Outpatient Prescriptions on File Prior to Visit  Medication Sig Dispense Refill  . ALPRAZolam (XANAX) 0.25 MG tablet Take 1 tablet (0.25 mg total) by mouth 2 (two) times daily as needed for anxiety. 30 tablet 0  . aspirin EC 81 MG tablet Take 81 mg by mouth daily. Reported on 03/06/2016    . cadexomer iodine (IODOSORB) 0.9 % gel Apply 1 application topically daily as needed for wound care. Right foot bottom at open area 40 g 1  . furosemide (LASIX) 20 MG tablet Take 10 mg by mouth daily as needed.   0  . glimepiride (AMARYL) 1 MG tablet Take 1 mg by mouth 2 (two) times daily.  0  . HYDROcodone-acetaminophen (NORCO) 10-325 MG tablet Take 1 tablet by mouth every 8 (eight) hours as needed (pain). 15 tablet 0  . Insulin Glargine (LANTUS SOLOSTAR) 100 UNIT/ML Solostar Pen Inject 10 Units into the skin at bedtime. 15 mL 11  . levalbuterol (XOPENEX) 0.63 MG/3ML nebulizer solution Take 3 mLs (0.63 mg total) by  nebulization every 6 (six) hours as needed for wheezing or shortness of breath. (Patient not taking: Reported on 03/06/2016) 500 mL 1  . levothyroxine (SYNTHROID, LEVOTHROID) 100 MCG tablet Take 100 mcg by mouth daily.  0  . Liniments (BLUE-EMU SUPER STRENGTH EX) Apply 1 application topically daily as needed (neck pain).    . metoprolol (LOPRESSOR) 25 MG tablet Take 1 tablet (25 mg total) by mouth 2 (two) times daily. 60 tablet 0  . omeprazole (PRILOSEC) 20 MG capsule Take 20 mg by mouth daily.    . pantoprazole (PROTONIX) 40 MG tablet Take 1 tablet (40 mg total) by mouth daily. (Patient not taking: Reported on 03/06/2016) 30 tablet 0  . predniSONE (DELTASONE) 10 MG tablet Take 1 tablet (10 mg total) by mouth daily with breakfast. 30 tablet 0  . silver sulfADIAZINE (SILVADENE) 1 % cream Apply 1 application topically daily. To right foot ulcer 50 g 0  . simvastatin (ZOCOR) 20 MG tablet Take 20 mg by mouth daily.    Marland Kitchen zolpidem (AMBIEN) 5 MG tablet Take 1 tablet (5 mg total) by mouth at bedtime as needed for sleep. (Patient not taking: Reported on 03/06/2016) 30 tablet 0   Current Facility-Administered Medications on File Prior to Visit  Medication Dose Route Frequency Provider Last Rate Last Dose  . triamcinolone acetonide (KENALOG) 10 MG/ML injection 10 mg  10 mg Other Once [employer]  Cannon Kettle, DPM       Allergies  Allergen Reactions  . Doxycycline     Pancreatitis with nausea  . Cephalexin Other (See Comments)    Pt states since she had meningitis, she not able to take antibiotic, but did not give reactions.   . Codeine Nausea And Vomiting    Objective: General: Patient is awake, alert, and oriented x 3 and in no acute distress.  Integument: Skin is warm, dry and supple bilateral. Nails are short and thickened, consistent with onychomycosis, 1-5 bilateral.   Prematurely healed ulceration sub-met 2, Right foot with no underlying opening, no surrounding signs of infection. No active drainage, no  erythema, no malodor.  To the dorsum of the right foot. There is decreased soft tissue swelling and edema, improved ecchymosis, no warmth to the second metatarsophalangeal joint.  Vasculature:  Dorsalis Pedis pulse 1/4 bilateral. Posterior Tibial pulse  1/4 bilateral. Capillary fill time <3 sec 1-5 bilateral. Scant hair growth to the level of the digits.Temperature gradient within normal limits. + varicosities present bilateral. +1 pitting edema present bilateral.   Neurology: The patient has intact sensation measured with a 5.07/10g Semmes Weinstein Monofilament at all pedal sites bilateral. Vibratory sensation diminished bilateral with tuning fork. No Babinski sign present bilateral.   Musculoskeletal: Decreased tenderness to palpation at the right second metatarsophalangeal joint, no tenderness to palpation to pre-ulcerative site sub met 2 right foot. Asymptomatic bunion and hammertoes noted bilateral. Muscular strength within normal limits. No tenderness with calf compression bilateral.  Assessment and Plan: Problem List Items Addressed This Visit    None    Visit Diagnoses    Right foot pain    -  Primary   Right foot ulcer, limited to breakdown of skin ([place])       prematurely healed   Diabetic polyneuropathy associated with type 2 diabetes mellitus ([place])         -Examined patient. -Discussed and educated patient on diabetic foot care, especially with regards to the vascular, neurological and musculoskeletal systems.  -Stressed the importance of good glycemic control and the detriment of not  controlling glucose levels in relation to the foot. -Applied PolyMem 1 x 3 adhesive dressing to keep in place for 1 week. Advised patient to have son keep intact and use offloading pad if needed surrounding the area; if PolyMem works well for patient we'll order through [place] continue with ice, elevation, surgitube compression stocking for edema control -Patient to continue with  extra-depth shoes, bilateral. -Answered all  patient questions -Patient to return in 1-2 weeks for recheck ulceration site or sooner if condition worsens.  -Patient advised to call the office or go to ER if the area worsens or if any problems arise.   Landis Martins, DPM

## 2016-06-13 ENCOUNTER — Encounter: Payer: Self-pay | Admitting: Sports Medicine

## 2016-06-13 ENCOUNTER — Ambulatory Visit (INDEPENDENT_AMBULATORY_CARE_PROVIDER_SITE_OTHER): Payer: Medicare Other | Admitting: Sports Medicine

## 2016-06-13 DIAGNOSIS — L97511 Non-pressure chronic ulcer of other part of right foot limited to breakdown of skin: Secondary | ICD-10-CM | POA: Diagnosis not present

## 2016-06-13 DIAGNOSIS — E1142 Type 2 diabetes mellitus with diabetic polyneuropathy: Secondary | ICD-10-CM

## 2016-06-13 DIAGNOSIS — M79674 Pain in right toe(s): Secondary | ICD-10-CM

## 2016-06-13 DIAGNOSIS — M2040 Other hammer toe(s) (acquired), unspecified foot: Secondary | ICD-10-CM | POA: Diagnosis not present

## 2016-06-13 DIAGNOSIS — M79671 Pain in right foot: Secondary | ICD-10-CM | POA: Diagnosis not present

## 2016-06-13 NOTE — Progress Notes (Signed)
Patient ID: Kirsten Gallegos, female   DOB: 1936-10-02, 80 y.o.   MRN: [id number]  Subjective: Kirsten Gallegos is a 80 y.o. female patient with history of type 2 diabetes who returns to office today for follow-up evaluation of right foot pain at area of previous healed ulceration; reports no pain. Patient has son at home, assisting with offloading the areas with oval shape pad and applying Iodosorb dressings. Denies constitutional symptoms. Patient denies any other pedal complaints or concerns at this time.   Patient Active Problem List   Diagnosis Date Noted  . Encounter for chest tube removal   . Emesis   . DM type 2 (diabetes mellitus, type 2) ([place]) 01/28/2016  . Pressure ulcer 01/23/2016  . Pulmonary embolism with acute cor pulmonale ([place])   . Pleural effusion on right   . Shortness of breath   . Hypoxemia   . Pulmonary embolism ([place]) 01/22/2016  . Pleural effusion   . Pericardial effusion   . Rheumatoid arthritis with positive rheumatoid factor (HCC)   . Mycotic toenails 07/23/2013   Current Outpatient Prescriptions on File Prior to Visit  Medication Sig Dispense Refill  . ALPRAZolam (XANAX) 0.25 MG tablet Take 1 tablet (0.25 mg total) by mouth 2 (two) times daily as needed for anxiety. 30 tablet 0  . aspirin EC 81 MG tablet Take 81 mg by mouth daily. Reported on 03/06/2016    . cadexomer iodine (IODOSORB) 0.9 % gel Apply 1 application topically daily as needed for wound care. Right foot bottom at open area 40 g 1  . furosemide (LASIX) 20 MG tablet Take 10 mg by mouth daily as needed.   0  . glimepiride (AMARYL) 1 MG tablet Take 1 mg by mouth 2 (two) times daily.  0  . HYDROcodone-acetaminophen (NORCO) 10-325 MG tablet Take 1 tablet by mouth every 8 (eight) hours as needed (pain). 15 tablet 0  . Insulin Glargine (LANTUS SOLOSTAR) 100 UNIT/ML Solostar Pen Inject 10 Units into the skin at bedtime. 15 mL 11  . levalbuterol (XOPENEX) 0.63 MG/3ML nebulizer solution Take 3 mLs (0.63 mg  total) by nebulization every 6 (six) hours as needed for wheezing or shortness of breath. (Patient not taking: Reported on 03/06/2016) 500 mL 1  . levothyroxine (SYNTHROID, LEVOTHROID) 100 MCG tablet Take 100 mcg by mouth daily.  0  . Liniments (BLUE-EMU SUPER STRENGTH EX) Apply 1 application topically daily as needed (neck pain).    . metoprolol (LOPRESSOR) 25 MG tablet Take 1 tablet (25 mg total) by mouth 2 (two) times daily. 60 tablet 0  . omeprazole (PRILOSEC) 20 MG capsule Take 20 mg by mouth daily.    . pantoprazole (PROTONIX) 40 MG tablet Take 1 tablet (40 mg total) by mouth daily. (Patient not taking: Reported on 03/06/2016) 30 tablet 0  . predniSONE (DELTASONE) 10 MG tablet Take 1 tablet (10 mg total) by mouth daily with breakfast. 30 tablet 0  . silver sulfADIAZINE (SILVADENE) 1 % cream Apply 1 application topically daily. To right foot ulcer 50 g 0  . simvastatin (ZOCOR) 20 MG tablet Take 20 mg by mouth daily.    Marland Kitchen zolpidem (AMBIEN) 5 MG tablet Take 1 tablet (5 mg total) by mouth at bedtime as needed for sleep. (Patient not taking: Reported on 03/06/2016) 30 tablet 0   Current Facility-Administered Medications on File Prior to Visit  Medication Dose Route Frequency Provider Last Rate Last Dose  . triamcinolone acetonide (KENALOG) 10 MG/ML injection 10 mg  10 mg Other  Once Landis Martins, DPM       Allergies  Allergen Reactions  . Doxycycline     Pancreatitis with nausea  . Cephalexin Other (See Comments)    Pt states since she had meningitis, she not able to take antibiotic, but did not give reactions.   . Codeine Nausea And Vomiting    Objective: General: Patient is awake, alert, and oriented x 3 and in no acute distress.  Integument: Skin is warm, dry and supple bilateral. Nails are short and thickened, consistent with onychomycosis, 1-5 bilateral.   Prematurely healed ulceration sub-met 2, Right foot with no underlying opening, no surrounding signs of infection. No active  drainage, no erythema, no malodor.  To the dorsum of the right foot. There is decreased soft tissue swelling and edema, improved ecchymosis, no warmth to the second metatarsophalangeal joint.  Vasculature:  Dorsalis Pedis pulse 1/4 bilateral. Posterior Tibial pulse  1/4 bilateral. Capillary fill time <3 sec 1-5 bilateral. Scant hair growth to the level of the digits.Temperature gradient within normal limits. + varicosities present bilateral. +1 pitting edema present bilateral.   Neurology: The patient has intact sensation measured with a 5.07/10g Semmes Weinstein Monofilament at all pedal sites bilateral. Vibratory sensation diminished bilateral with tuning fork. No Babinski sign present bilateral.   Musculoskeletal: Decreased tenderness to palpation at the right second metatarsophalangeal joint, no tenderness to palpation to pre-ulcerative site sub met 2 right foot. Asymptomatic bunion and hammertoes noted bilateral. Muscular strength within normal limits. No tenderness with calf compression bilateral.  Assessment and Plan: Problem List Items Addressed This Visit    None    Visit Diagnoses    Right foot pain    -  Primary   Right foot ulcer, limited to breakdown of skin ([place])       Prematurely healed   Diabetic polyneuropathy associated with type 2 diabetes mellitus (HCC)       Pain of toe of right foot       Improved   Hammertoe, unspecified laterality         -Examined patient. -Discussed and educated patient on diabetic foot care, especially with regards to the vascular, neurological and musculoskeletal systems.  -Stressed the importance of good glycemic control and the detriment of not controlling glucose levels in relation to the foot. -Right foot ulcer Prematurely healed; continue with offloading pad to site -Dispensed toe crest pad for hammertoe to use as instructed -Recommend continue with ice, elevation, surgitube compression stocking for edema control -Patient to continue  with extra-depth shoes, bilateral. -Answered all  patient questions -Patient to return in 2 weeks for recheck of healed ulceration site or sooner if condition worsens.  -Patient advised to call the office or go to ER if the area worsens or if any problems arise.   Landis Martins, DPM

## 2016-06-27 ENCOUNTER — Encounter: Payer: Self-pay | Admitting: Sports Medicine

## 2016-06-27 ENCOUNTER — Ambulatory Visit (INDEPENDENT_AMBULATORY_CARE_PROVIDER_SITE_OTHER): Payer: Medicare Other | Admitting: Sports Medicine

## 2016-06-27 DIAGNOSIS — L97511 Non-pressure chronic ulcer of other part of right foot limited to breakdown of skin: Secondary | ICD-10-CM

## 2016-06-27 DIAGNOSIS — M79671 Pain in right foot: Secondary | ICD-10-CM

## 2016-06-27 DIAGNOSIS — E1142 Type 2 diabetes mellitus with diabetic polyneuropathy: Secondary | ICD-10-CM

## 2016-06-27 NOTE — Progress Notes (Signed)
Patient ID: Kirsten Gallegos, female   DOB: 07-20-1936, 80 y.o.   MRN: [id number]  Subjective: Kirsten Gallegos is a 80 y.o. female patient with history of type 2 diabetes who returns to office today for follow-up evaluation of right foot pain secondary to ulceration which is now healed. Patient has son at home, assisting with offloading the area with oval shape pad. Denies constitutional symptoms. Patient denies any other pedal complaints or concerns at this time.   Patient Active Problem List   Diagnosis Date Noted  . Encounter for chest tube removal   . Emesis   . DM type 2 (diabetes mellitus, type 2) ([place]) 01/28/2016  . Pressure ulcer 01/23/2016  . Pulmonary embolism with acute cor pulmonale ([place])   . Pleural effusion on right   . Shortness of breath   . Hypoxemia   . Pulmonary embolism ([place]) 01/22/2016  . Pleural effusion   . Pericardial effusion   . Rheumatoid arthritis with positive rheumatoid factor (HCC)   . Mycotic toenails 07/23/2013   Current Outpatient Prescriptions on File Prior to Visit  Medication Sig Dispense Refill  . ALPRAZolam (XANAX) 0.25 MG tablet Take 1 tablet (0.25 mg total) by mouth 2 (two) times daily as needed for anxiety. 30 tablet 0  . aspirin EC 81 MG tablet Take 81 mg by mouth daily. Reported on 03/06/2016    . cadexomer iodine (IODOSORB) 0.9 % gel Apply 1 application topically daily as needed for wound care. Right foot bottom at open area 40 g 1  . furosemide (LASIX) 20 MG tablet Take 10 mg by mouth daily as needed.   0  . glimepiride (AMARYL) 1 MG tablet Take 1 mg by mouth 2 (two) times daily.  0  . HYDROcodone-acetaminophen (NORCO) 10-325 MG tablet Take 1 tablet by mouth every 8 (eight) hours as needed (pain). 15 tablet 0  . Insulin Glargine (LANTUS SOLOSTAR) 100 UNIT/ML Solostar Pen Inject 10 Units into the skin at bedtime. 15 mL 11  . levalbuterol (XOPENEX) 0.63 MG/3ML nebulizer solution Take 3 mLs (0.63 mg total) by nebulization every 6 (six) hours as  needed for wheezing or shortness of breath. (Patient not taking: Reported on 03/06/2016) 500 mL 1  . levothyroxine (SYNTHROID, LEVOTHROID) 100 MCG tablet Take 100 mcg by mouth daily.  0  . Liniments (BLUE-EMU SUPER STRENGTH EX) Apply 1 application topically daily as needed (neck pain).    . metoprolol (LOPRESSOR) 25 MG tablet Take 1 tablet (25 mg total) by mouth 2 (two) times daily. 60 tablet 0  . omeprazole (PRILOSEC) 20 MG capsule Take 20 mg by mouth daily.    . pantoprazole (PROTONIX) 40 MG tablet Take 1 tablet (40 mg total) by mouth daily. (Patient not taking: Reported on 03/06/2016) 30 tablet 0  . predniSONE (DELTASONE) 10 MG tablet Take 1 tablet (10 mg total) by mouth daily with breakfast. 30 tablet 0  . silver sulfADIAZINE (SILVADENE) 1 % cream Apply 1 application topically daily. To right foot ulcer 50 g 0  . simvastatin (ZOCOR) 20 MG tablet Take 20 mg by mouth daily.    Marland Kitchen zolpidem (AMBIEN) 5 MG tablet Take 1 tablet (5 mg total) by mouth at bedtime as needed for sleep. (Patient not taking: Reported on 03/06/2016) 30 tablet 0   Current Facility-Administered Medications on File Prior to Visit  Medication Dose Route Frequency Provider Last Rate Last Dose  . triamcinolone acetonide (KENALOG) 10 MG/ML injection 10 mg  10 mg Other Once Landis Martins, DPM  Allergies  Allergen Reactions  . Doxycycline     Pancreatitis with nausea  . Cephalexin Other (See Comments)    Pt states since she had meningitis, she not able to take antibiotic, but did not give reactions.   . Codeine Nausea And Vomiting    Objective: General: Patient is awake, alert, and oriented x 3 and in no acute distress.  Integument: Skin is warm, dry and supple bilateral. Nails are short and thickened, consistent with onychomycosis, 1-5 bilateral.   Prematurely healed ulceration sub-met 2, Right foot with no underlying opening, no surrounding signs of infection. No active drainage, no erythema, no malodor.  To the dorsum  of the right foot. There is decreased soft tissue swelling and edema, continued improved ecchymosis, no warmth to the second metatarsophalangeal joint.  Vasculature:  Dorsalis Pedis pulse 1/4 bilateral. Posterior Tibial pulse  1/4 bilateral. Capillary fill time <3 sec 1-5 bilateral. Scant hair growth to the level of the digits.Temperature gradient within normal limits. + varicosities present bilateral. +1 pitting edema present bilateral.   Neurology: The patient has intact sensation measured with a 5.07/10g Semmes Weinstein Monofilament at all pedal sites bilateral. Vibratory sensation diminished bilateral with tuning fork. No Babinski sign present bilateral.   Musculoskeletal: No tenderness to palpation at the right second metatarsophalangeal joint, no tenderness to palpation to pre-ulcerative site sub met 2 right foot. Asymptomatic bunion and hammertoes noted bilateral. Muscular strength within normal limits. No tenderness with calf compression bilateral.  Assessment and Plan: Problem List Items Addressed This Visit    None    Visit Diagnoses    Right foot pain    -  Primary   Right foot ulcer, limited to breakdown of skin ([place])       Healed   Diabetic polyneuropathy associated with type 2 diabetes mellitus ([place])         -Examined patient. -Discussed and educated patient on diabetic foot care, especially with regards to the vascular, neurological and musculoskeletal systems.  -Stressed the importance of good glycemic control and the detriment of not controlling glucose levels in relation to the foot. -Right foot ulcer Prematurely healed; continue with offloading pad to site -Continue with toe crest pad for hammertoe to use as instructed -Recommend continue with ice, elevation, surgitube compression stocking for edema control -Patient to continue with extra-depth shoes, bilateral. -Answered all  patient questions -Patient to return in 4 weeks for recheck of healed ulceration site, and  routine nail care or sooner if condition worsens.  -Patient advised to call the office or go to ER if the area worsens or if any problems arise.   Landis Martins, DPM

## 2016-07-25 ENCOUNTER — Ambulatory Visit: Payer: Medicare Other | Admitting: Sports Medicine

## 2016-07-31 ENCOUNTER — Ambulatory Visit: Payer: Medicare Other | Admitting: Sports Medicine

## 2016-08-01 ENCOUNTER — Ambulatory Visit (INDEPENDENT_AMBULATORY_CARE_PROVIDER_SITE_OTHER): Payer: Medicare Other | Admitting: Sports Medicine

## 2016-08-01 DIAGNOSIS — M79672 Pain in left foot: Secondary | ICD-10-CM

## 2016-08-01 DIAGNOSIS — M79671 Pain in right foot: Secondary | ICD-10-CM | POA: Diagnosis not present

## 2016-08-01 DIAGNOSIS — M2040 Other hammer toe(s) (acquired), unspecified foot: Secondary | ICD-10-CM

## 2016-08-01 DIAGNOSIS — E1142 Type 2 diabetes mellitus with diabetic polyneuropathy: Secondary | ICD-10-CM | POA: Diagnosis not present

## 2016-08-01 DIAGNOSIS — L97511 Non-pressure chronic ulcer of other part of right foot limited to breakdown of skin: Secondary | ICD-10-CM

## 2016-08-01 NOTE — Progress Notes (Signed)
Patient ID: Kirsten Gallegos, female   DOB: 1935/12/11, 80 y.o.   MRN: [id number]  Subjective: Kirsten Gallegos is a 80 y.o. female patient with history of type 2 diabetes who returns to office today for follow-up evaluation of right foot pain secondary to ulceration which is now healed. Patient has son at home, assisting with offloading the area with oval shape pad. Denies constitutional symptoms or recurrence. Patient denies any other pedal complaints or concerns at this time.   Patient Active Problem List   Diagnosis Date Noted  . Encounter for chest tube removal   . Emesis   . DM type 2 (diabetes mellitus, type 2) ([place]) 01/28/2016  . Pressure ulcer 01/23/2016  . Pulmonary embolism with acute cor pulmonale ([place])   . Pleural effusion on right   . Shortness of breath   . Hypoxemia   . Pulmonary embolism ([place]) 01/22/2016  . Pleural effusion   . Pericardial effusion   . Rheumatoid arthritis with positive rheumatoid factor (HCC)   . Mycotic toenails 07/23/2013   Current Outpatient Prescriptions on File Prior to Visit  Medication Sig Dispense Refill  . ALPRAZolam (XANAX) 0.25 MG tablet Take 1 tablet (0.25 mg total) by mouth 2 (two) times daily as needed for anxiety. 30 tablet 0  . aspirin EC 81 MG tablet Take 81 mg by mouth daily. Reported on 03/06/2016    . cadexomer iodine (IODOSORB) 0.9 % gel Apply 1 application topically daily as needed for wound care. Right foot bottom at open area 40 g 1  . furosemide (LASIX) 20 MG tablet Take 10 mg by mouth daily as needed.   0  . glimepiride (AMARYL) 1 MG tablet Take 1 mg by mouth 2 (two) times daily.  0  . HYDROcodone-acetaminophen (NORCO) 10-325 MG tablet Take 1 tablet by mouth every 8 (eight) hours as needed (pain). 15 tablet 0  . Insulin Glargine (LANTUS SOLOSTAR) 100 UNIT/ML Solostar Pen Inject 10 Units into the skin at bedtime. 15 mL 11  . levalbuterol (XOPENEX) 0.63 MG/3ML nebulizer solution Take 3 mLs (0.63 mg total) by nebulization every 6  (six) hours as needed for wheezing or shortness of breath. (Patient not taking: Reported on 03/06/2016) 500 mL 1  . levothyroxine (SYNTHROID, LEVOTHROID) 100 MCG tablet Take 100 mcg by mouth daily.  0  . Liniments (BLUE-EMU SUPER STRENGTH EX) Apply 1 application topically daily as needed (neck pain).    . metoprolol (LOPRESSOR) 25 MG tablet Take 1 tablet (25 mg total) by mouth 2 (two) times daily. 60 tablet 0  . omeprazole (PRILOSEC) 20 MG capsule Take 20 mg by mouth daily.    . pantoprazole (PROTONIX) 40 MG tablet Take 1 tablet (40 mg total) by mouth daily. (Patient not taking: Reported on 03/06/2016) 30 tablet 0  . predniSONE (DELTASONE) 10 MG tablet Take 1 tablet (10 mg total) by mouth daily with breakfast. 30 tablet 0  . silver sulfADIAZINE (SILVADENE) 1 % cream Apply 1 application topically daily. To right foot ulcer 50 g 0  . simvastatin (ZOCOR) 20 MG tablet Take 20 mg by mouth daily.    Marland Kitchen zolpidem (AMBIEN) 5 MG tablet Take 1 tablet (5 mg total) by mouth at bedtime as needed for sleep. (Patient not taking: Reported on 03/06/2016) 30 tablet 0   Current Facility-Administered Medications on File Prior to Visit  Medication Dose Route Frequency Provider Last Rate Last Dose  . triamcinolone acetonide (KENALOG) 10 MG/ML injection 10 mg  10 mg Other Once Landis Martins, DPM  Allergies  Allergen Reactions  . Doxycycline     Pancreatitis with nausea  . Cephalexin Other (See Comments)    Pt states since she had meningitis, she not able to take antibiotic, but did not give reactions.   . Codeine Nausea And Vomiting    Objective: General: Patient is awake, alert, and oriented x 3 and in no acute distress.  Integument: Skin is warm, dry and supple bilateral. Nails are short and thickened, consistent with onychomycosis, 1-5 bilateral.   Healed ulceration sub-met 2, Right foot with no underlying opening, no surrounding signs of infection. No active drainage, no erythema, no malodor.  Vasculature:   Dorsalis Pedis pulse 1/4 bilateral. Posterior Tibial pulse  1/4 bilateral. Capillary fill time <3 sec 1-5 bilateral. Scant hair growth to the level of the digits.Temperature gradient within normal limits. + varicosities present bilateral. Trace edema present bilateral.   Neurology: The patient has intact sensation measured with a 5.07/10g Semmes Weinstein Monofilament at all pedal sites bilateral. Vibratory sensation diminished bilateral with tuning fork. No Babinski sign present bilateral.   Musculoskeletal: No tenderness to palpation at the right second metatarsophalangeal joint, no tenderness to palpation to pre-ulcerative site sub met 2 right foot. Asymptomatic bunion and hammertoes noted bilateral. Muscular strength within normal limits. No tenderness with calf compression bilateral.  Assessment and Plan: Problem List Items Addressed This Visit    None    Visit Diagnoses    Diabetic polyneuropathy associated with type 2 diabetes mellitus ([place])    -  Primary   Right foot ulcer, limited to breakdown of skin ([place])       healed   Hammertoe, unspecified laterality       Foot pain, bilateral         -Examined patient. -Discussed and educated patient on diabetic foot care, especially with regards to the vascular, neurological and musculoskeletal systems.  -Stressed the importance of good glycemic control and the detriment of not controlling glucose levels in relation to the foot. -Right foot ulcer is healed; continue with offloading pad to site or shoe insert -Continue with toe crest pad for hammertoe to use as instructed -Patient to continue with extra-depth shoes, bilateral. Patient needs new shoes because current shoes are worn and Velcro is coming apart.[employer] diabetic shoe order form was completed; office to contact primary care for approval / certification;  Office to arrange shoe fitting and dispensing. -Answered all  patient questions -Patient to return in 8 weeks for diabetic foot  exam and routine nail care or sooner if condition worsens.   Landis Martins, DPM

## 2016-10-02 ENCOUNTER — Ambulatory Visit (INDEPENDENT_AMBULATORY_CARE_PROVIDER_SITE_OTHER): Payer: Medicare Other | Admitting: Sports Medicine

## 2016-10-02 ENCOUNTER — Encounter: Payer: Self-pay | Admitting: Sports Medicine

## 2016-10-02 DIAGNOSIS — B351 Tinea unguium: Secondary | ICD-10-CM | POA: Diagnosis not present

## 2016-10-02 DIAGNOSIS — M79671 Pain in right foot: Secondary | ICD-10-CM | POA: Diagnosis not present

## 2016-10-02 DIAGNOSIS — E1142 Type 2 diabetes mellitus with diabetic polyneuropathy: Secondary | ICD-10-CM | POA: Diagnosis not present

## 2016-10-02 DIAGNOSIS — M79672 Pain in left foot: Secondary | ICD-10-CM

## 2016-10-02 NOTE — Progress Notes (Signed)
Patient ID: Kirsten Gallegos, female   DOB: [birth year], 80 y.o.   MRN: [id number]  Subjective: Kirsten Gallegos is a 80 y.o. female patient with history of type 2 diabetes who returns to office today for follow-up evaluation of  Right healed ulceration and nail care. Patient denies constitutional symptoms or recurrence. Patient denies any other pedal complaints or concerns at this time.   Patient Active Problem List   Diagnosis Date Noted  . Encounter for chest tube removal   . Emesis   . DM type 2 (diabetes mellitus, type 2) ([place]) 01/28/2016  . Pressure ulcer 01/23/2016  . Pulmonary embolism with acute cor pulmonale ([place])   . Pleural effusion on right   . Shortness of breath   . Hypoxemia   . Pulmonary embolism ([place]) 01/22/2016  . Pleural effusion   . Pericardial effusion   . Rheumatoid arthritis with positive rheumatoid factor (HCC)   . Mycotic toenails 07/23/2013   Current Outpatient Prescriptions on File Prior to Visit  Medication Sig Dispense Refill  . ALPRAZolam (XANAX) 0.25 MG tablet Take 1 tablet (0.25 mg total) by mouth 2 (two) times daily as needed for anxiety. 30 tablet 0  . aspirin EC 81 MG tablet Take 81 mg by mouth daily. Reported on 03/06/2016    . cadexomer iodine (IODOSORB) 0.9 % gel Apply 1 application topically daily as needed for wound care. Right foot bottom at open area 40 g 1  . furosemide (LASIX) 20 MG tablet Take 10 mg by mouth daily as needed.   0  . glimepiride (AMARYL) 1 MG tablet Take 1 mg by mouth 2 (two) times daily.  0  . HYDROcodone-acetaminophen (NORCO) 10-325 MG tablet Take 1 tablet by mouth every 8 (eight) hours as needed (pain). 15 tablet 0  . Insulin Glargine (LANTUS SOLOSTAR) 100 UNIT/ML Solostar Pen Inject 10 Units into the skin at bedtime. 15 mL 11  . levalbuterol (XOPENEX) 0.63 MG/3ML nebulizer solution Take 3 mLs (0.63 mg total) by nebulization every 6 (six) hours as needed for wheezing or shortness of breath. (Patient not taking: Reported on  03/06/2016) 500 mL 1  . levothyroxine (SYNTHROID, LEVOTHROID) 100 MCG tablet Take 100 mcg by mouth daily.  0  . Liniments (BLUE-EMU SUPER STRENGTH EX) Apply 1 application topically daily as needed (neck pain).    . metoprolol (LOPRESSOR) 25 MG tablet Take 1 tablet (25 mg total) by mouth 2 (two) times daily. 60 tablet 0  . omeprazole (PRILOSEC) 20 MG capsule Take 20 mg by mouth daily.    . pantoprazole (PROTONIX) 40 MG tablet Take 1 tablet (40 mg total) by mouth daily. (Patient not taking: Reported on 03/06/2016) 30 tablet 0  . predniSONE (DELTASONE) 10 MG tablet Take 1 tablet (10 mg total) by mouth daily with breakfast. 30 tablet 0  . silver sulfADIAZINE (SILVADENE) 1 % cream Apply 1 application topically daily. To right foot ulcer 50 g 0  . simvastatin (ZOCOR) 20 MG tablet Take 20 mg by mouth daily.    Marland Kitchen zolpidem (AMBIEN) 5 MG tablet Take 1 tablet (5 mg total) by mouth at bedtime as needed for sleep. (Patient not taking: Reported on 03/06/2016) 30 tablet 0   Current Facility-Administered Medications on File Prior to Visit  Medication Dose Route Frequency Provider Last Rate Last Dose  . triamcinolone acetonide (KENALOG) 10 MG/ML injection 10 mg  10 mg Other Once Landis Martins, DPM       Allergies  Allergen Reactions  . Doxycycline  Pancreatitis with nausea  . Cephalexin Other (See Comments)    Pt states since she had meningitis, she not able to take antibiotic, but did not give reactions.   . Codeine Nausea And Vomiting    Objective: General: Patient is awake, alert, and oriented x 3 and in no acute distress.  Integument: Skin is warm, dry and supple bilateral. Nails are elonagted and thickened, consistent with onychomycosis, 1-5 bilateral.   Healed ulceration sub-met 2, Right foot with no underlying opening, no surrounding signs of infection. No active drainage, no erythema, no malodor.  Vasculature:  Dorsalis Pedis pulse 1/4 bilateral. Posterior Tibial pulse  1/4 bilateral. Capillary  fill time <3 sec 1-5 bilateral. Scant hair growth to the level of the digits.Temperature gradient within normal limits. + varicosities present bilateral. Trace edema present bilateral.   Neurology: The patient has intact sensation measured with a 5.07/10g Semmes Weinstein Monofilament at all pedal sites bilateral. Vibratory sensation diminished bilateral with tuning fork. No Babinski sign present bilateral.   Musculoskeletal: No tenderness to palpation at the right second metatarsophalangeal joint, no tenderness to palpation to pre-ulcerative site sub met 2 right foot. Asymptomatic bunion and hammertoes noted bilateral. Muscular strength within normal limits. No tenderness with calf compression bilateral.  Assessment and Plan: Problem List Items Addressed This Visit    None    Visit Diagnoses    Onychomycosis    -  Primary   Foot pain, bilateral       Diabetic polyneuropathy associated with type 2 diabetes mellitus ([place])         -Examined patient. -Discussed and educated patient on diabetic foot care, especially with regards to the vascular, neurological and musculoskeletal systems.  -Stressed the importance of good glycemic control and the detriment of not controlling glucose levels in relation to the foot. -Nails x 10 were debrided using sterile nail nipper without incident  -Right foot ulcer is healed; continue with offloading diabetic inserts; patient is awaiting new diabetic shoes; paperwork was given to patient to take to PCP to be signed -Continue with toe crest pad for hammertoe to use as instructed -Answered all  patient questions -Patient to return in 10 weeks for diabetic routine nail care or sooner if condition worsens.   Landis Martins, DPM

## 2016-10-26 IMAGING — XA IR IVC FILTER PLMT / S&I /IMG GUID/MOD SED
2 series · 14 of 17 positions shown · IV contrast (IODINE)
Comparison: none

INDICATION: 79-year-old with a complex right pleural effusion and evidence for a
small pulmonary embolism in the right lower lobe. Request for an IVC
filter so that the patient can come off anticoagulation for VATS
procedure.

[Series 1: body 4 care · 10 of 12 slices shown (1 of 2)]
[im 1/12]
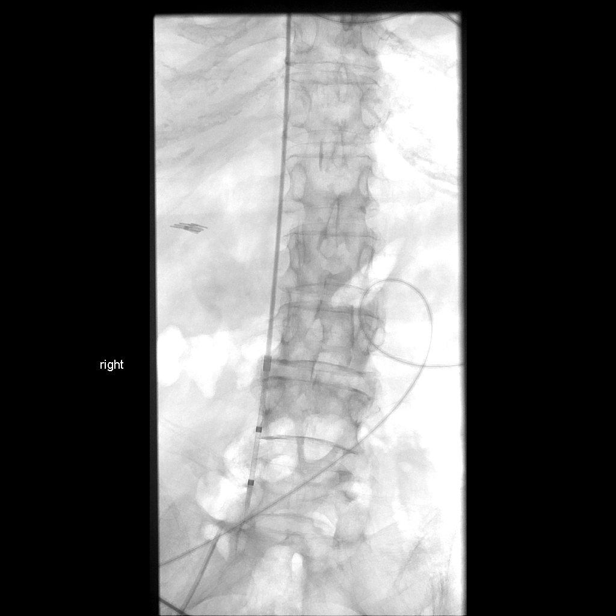
[im 2/12]
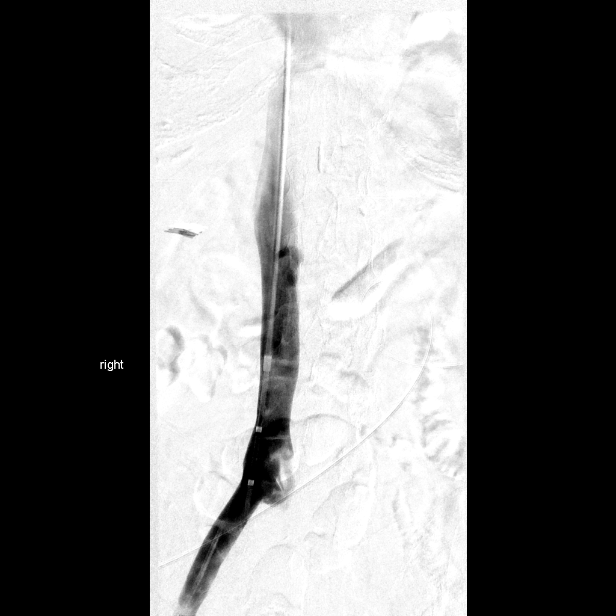
[im 4/12]
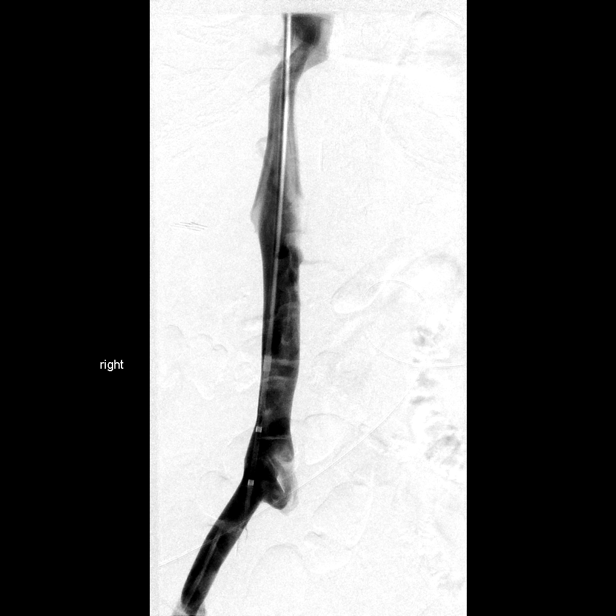
[im 5/12]
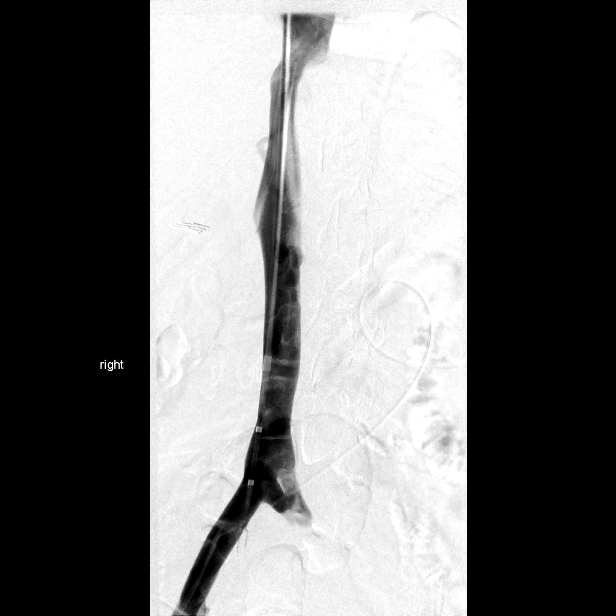
[im 6/12]
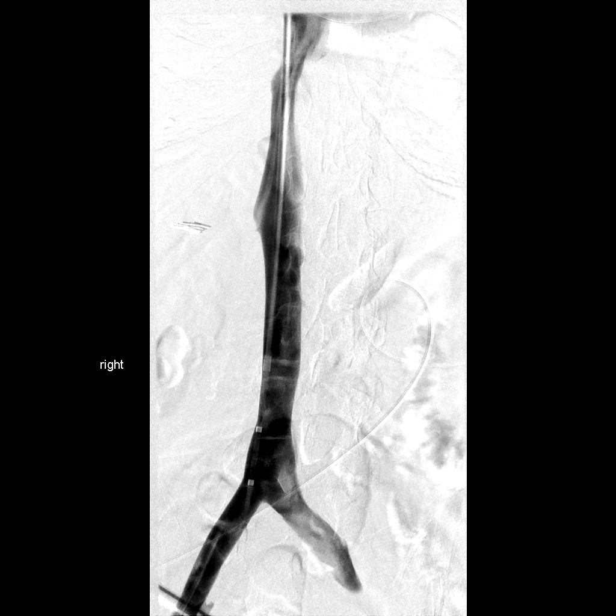
[im 7/12]
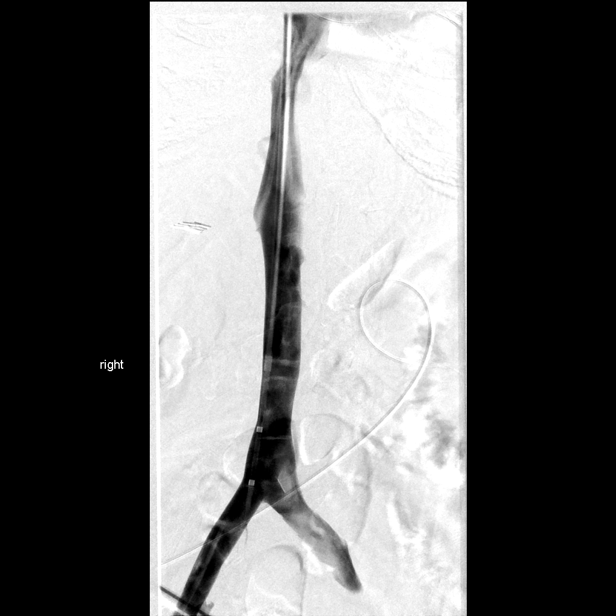
[im 8/12]
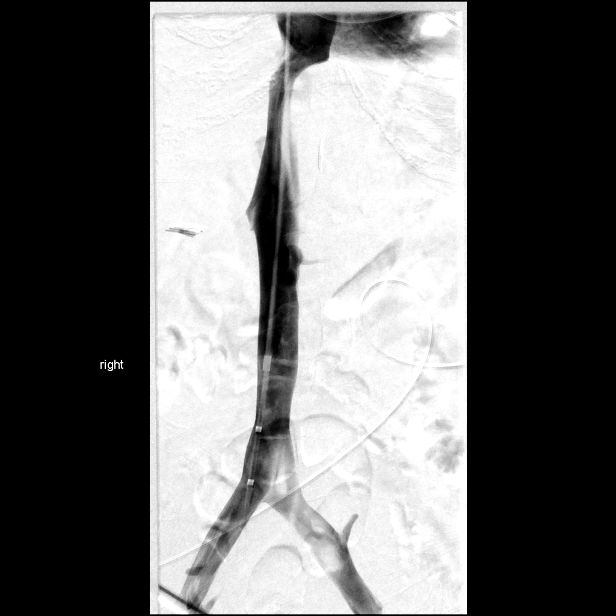
[im 10/12]
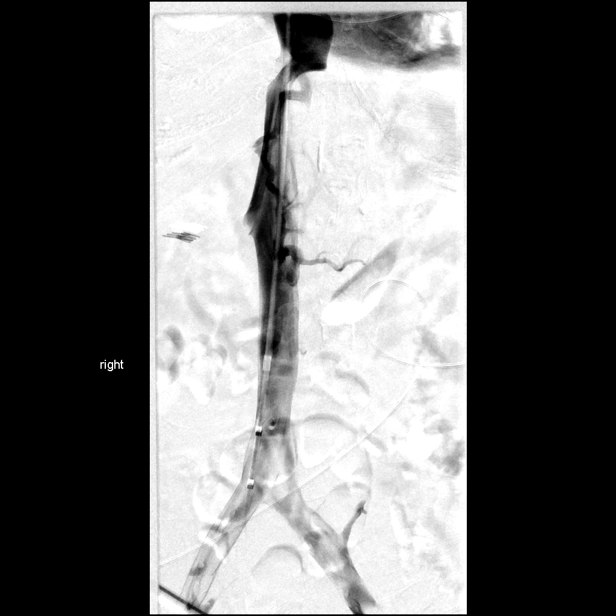
[im 11/12]
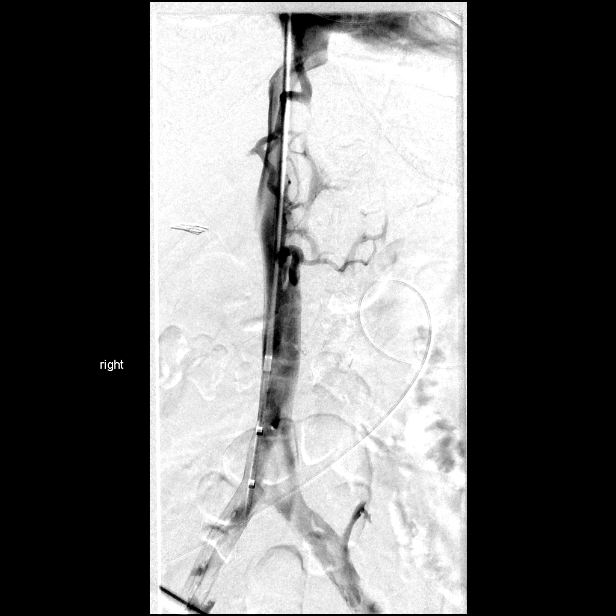
[im 12/12]
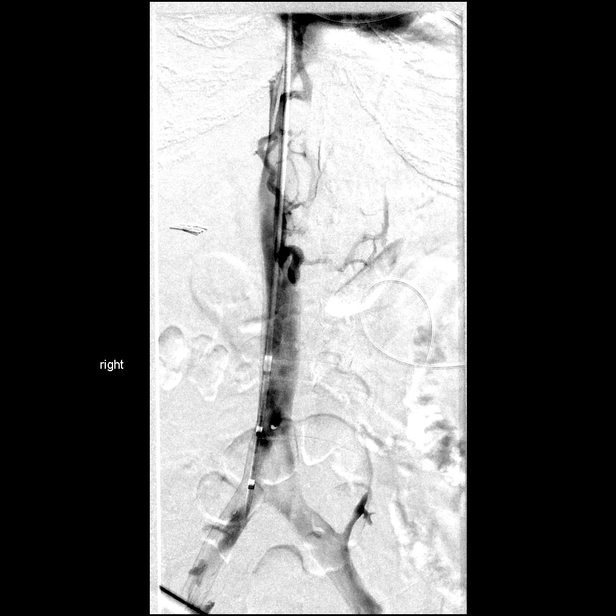

[Series 2: body 4 care · 4 of 5 slices shown (2 of 2)]
[im 1/5]
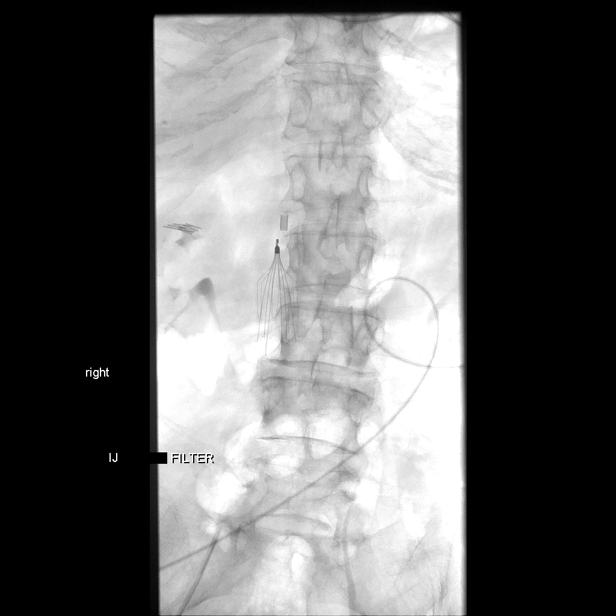
[im 2/5]
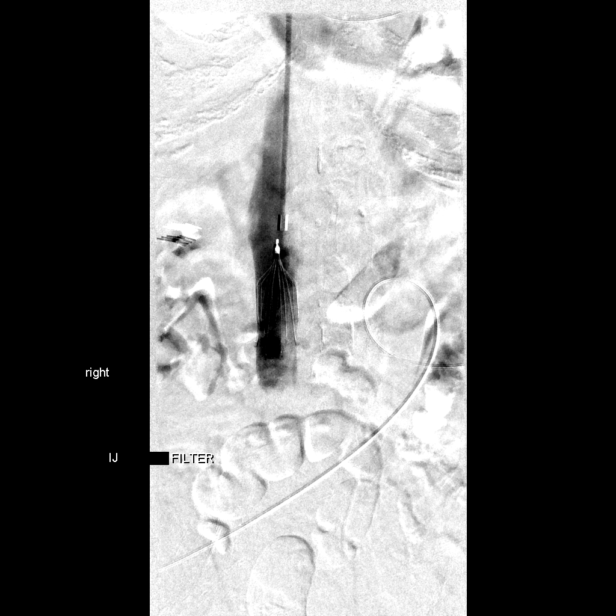
[im 4/5]
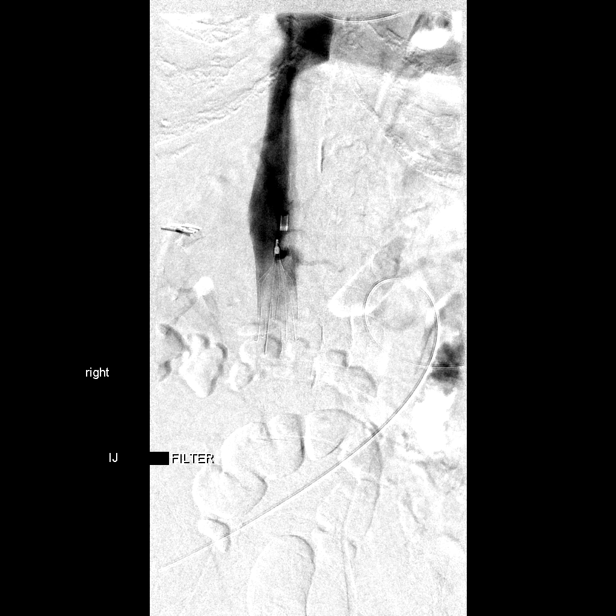
[im 5/5]
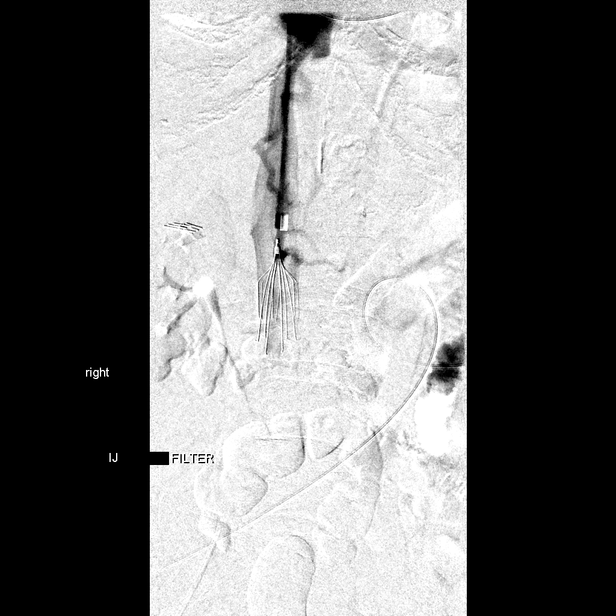

[14 of 17 positions shown; findings below may reference images not displayed]

EXAM:
IVC FILTER PLACEMENT; IVC VENOGRAM; ULTRASOUND FOR VASCULAR ACCESS

MEDICATIONS:
None.

ANESTHESIA/SEDATION:
Fentanyl 1.5 mcg IV; Versed 50 mg IV

Moderate Sedation Time:  15 minutes

The patient was continuously monitored during the procedure by the
interventional radiology nurse under my direct supervision.

CONTRAST:  50 mL Omnipaque 300

FLUOROSCOPY TIME:  Fluoroscopy Time: 1 minutes 54 seconds (32 mGy).

COMPLICATIONS:
None immediate.

PROCEDURE:
Informed consent was obtained for an IVC venogram and filter
placement. Ultrasound demonstrated a patent right internal jugular
vein. Ultrasound images were obtained for documentation. The right
side of the neck was prepped and draped in a sterile fashion.
Maximal barrier sterile technique was utilized including caps, mask,
sterile gowns, sterile gloves, sterile drape, hand hygiene and skin
antiseptic. The skin was anesthetized with 1% lidocaine. A 21 gauge
needle was directed into the vein with ultrasound guidance and a
micropuncture dilator set was placed. A wire was advanced into the
IVC. The filter sheath was advanced over the wire into the IVC. An
IVC venogram was performed. Fluoroscopic images were obtained for
documentation. Sallie Rizzo filter was deployed below the lowest
renal vein. A follow-up venogram was performed and the vascular
sheath was removed with manual compression.

Fluoroscopic and ultrasound images were taken and saved for
documentation.
FINDINGS: IVC was patent. Bilateral renal veins were identified. The filter
was deployed below the lowest renal vein. Follow-up venogram
confirmed placement within the IVC and below the renal veins.
IMPRESSION: Successful placement of a retrievable IVC filter.

This IVC filter is potentially retrievable. The patient will be
approximately 8-12 weeks. Further recommendations regarding filter
retrieval, continued surveillance or declaration of device
permanence, will be made at that time.

## 2016-10-27 IMAGING — CR DG CHEST 1V PORT
1 series · 1 of 1 positions shown · non-contrast
Comparison: Chest radiograph January 23, 2016 and chest CT January 24, 2016

CLINICAL DATA: Status post video-assisted thoracoscopy optic
surgery with chest tube placement on the right

EXAM:
PORTABLE CHEST 1 VIEW

[AP]
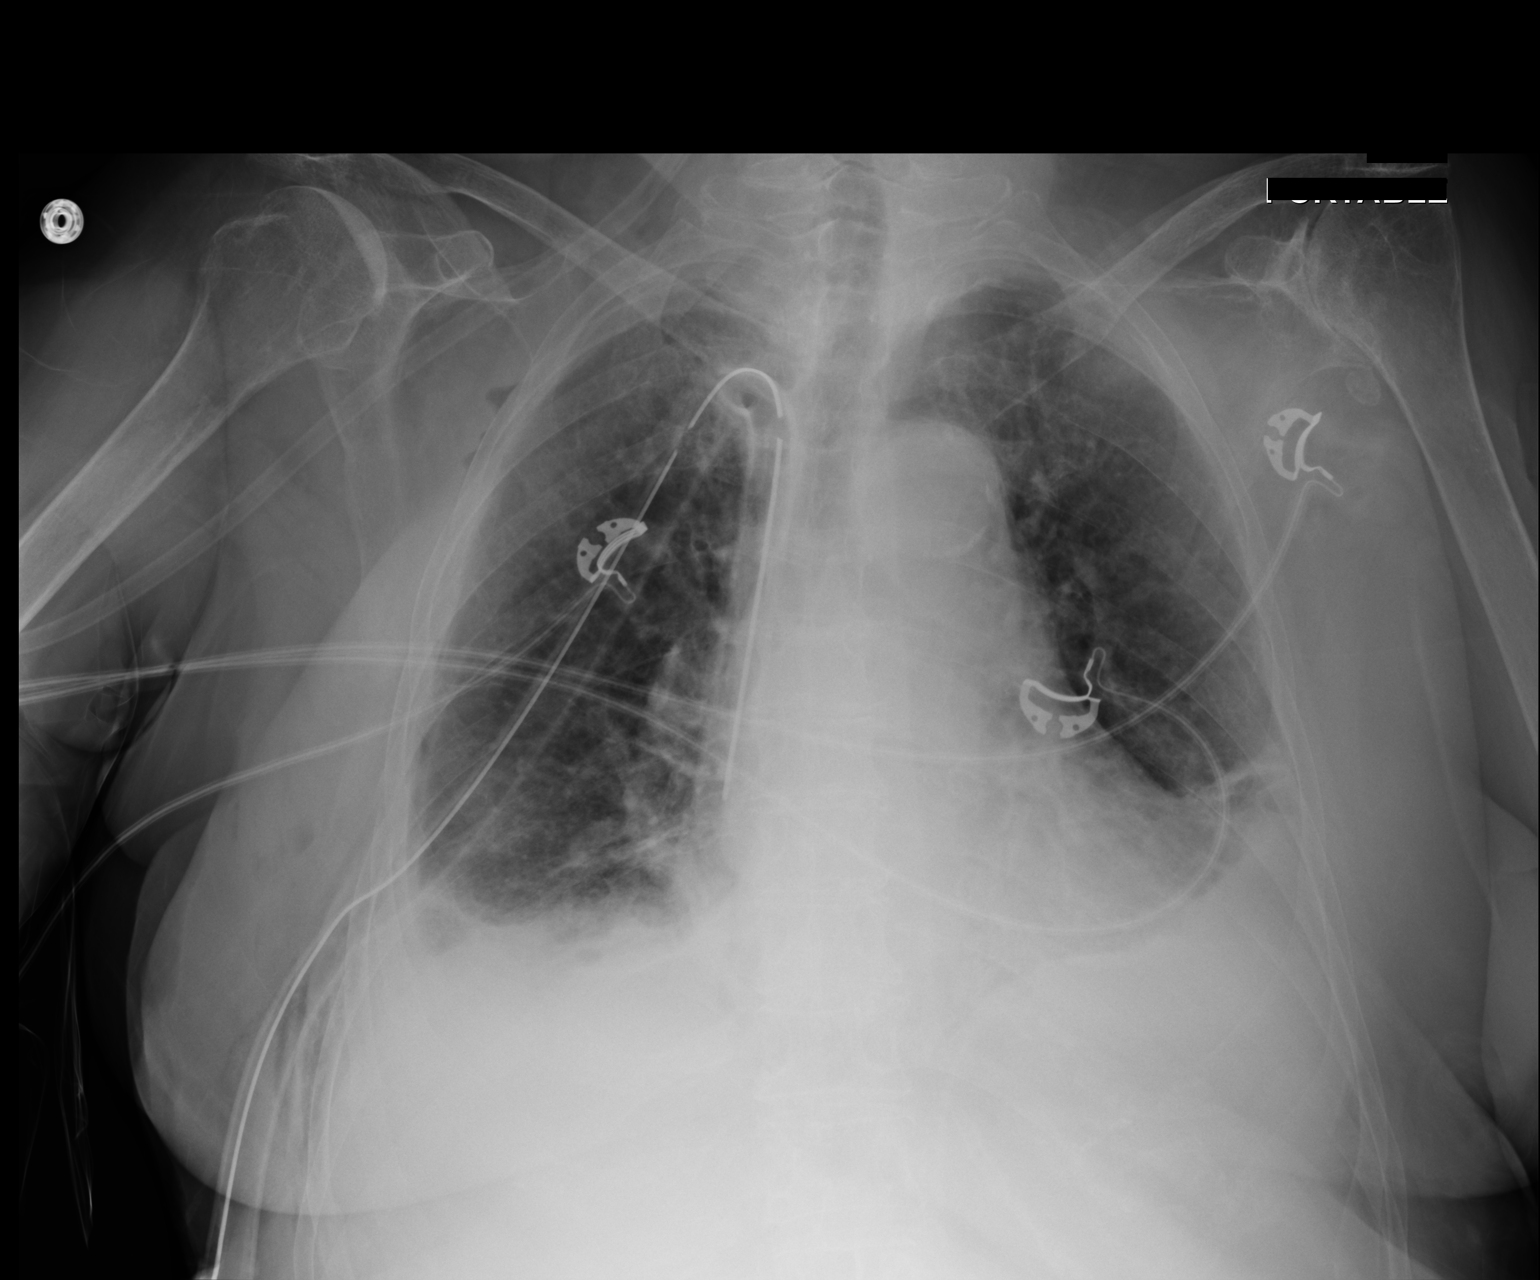

[1 of 1 positions shown; findings below may reference images not displayed]

FINDINGS: There is a chest tube on the right with the tip directed inferiorly
and medially. There is no appreciable pneumothorax. There is mild
subcutaneous air on the right, however.

There is been significant resolution of pleural effusion on the
right since chest tube placement with only small residual right
effusion. There is a small pleural effusion on the left, stable.
There is patchy bibasilar atelectasis. Heart is upper normal in size
with pulmonary vascular within normal limits. No adenopathy. There
is extensive arthropathy in the left shoulder.
IMPRESSION: Right chest tube present. There is subcutaneous air on the right but
no apparent pneumothorax. There are currently small bilateral
pleural effusions with patchy bibasilar atelectasis. Heart upper
normal in size, stable.

## 2016-12-11 ENCOUNTER — Encounter: Payer: Self-pay | Admitting: Sports Medicine

## 2016-12-11 ENCOUNTER — Ambulatory Visit (INDEPENDENT_AMBULATORY_CARE_PROVIDER_SITE_OTHER): Payer: Medicare Other | Admitting: Sports Medicine

## 2016-12-11 DIAGNOSIS — S90424A Blister (nonthermal), right lesser toe(s), initial encounter: Secondary | ICD-10-CM

## 2016-12-11 DIAGNOSIS — B351 Tinea unguium: Secondary | ICD-10-CM

## 2016-12-11 DIAGNOSIS — M79671 Pain in right foot: Secondary | ICD-10-CM

## 2016-12-11 DIAGNOSIS — E1142 Type 2 diabetes mellitus with diabetic polyneuropathy: Secondary | ICD-10-CM

## 2016-12-11 DIAGNOSIS — M79672 Pain in left foot: Secondary | ICD-10-CM

## 2016-12-11 NOTE — Progress Notes (Signed)
Patient ID: Kirsten Gallegos, female   DOB: 12/23/1935, 81 y.o.   MRN: [id number]  Subjective: Kirsten Gallegos is a 81 y.o. female patient with history of type 2 diabetes who returns to office today for follow-up evaluation of  Right healed ulceration and nail care. Admits to bruising to leg from her dog jumping on her and her dog eating her shoe requiring her to use her old diabetic shoes. Patient denies constitutional symptoms or recurrence. Patient denies any other pedal complaints or concerns at this time.   Patient Active Problem List   Diagnosis Date Noted  . Encounter for chest tube removal   . Emesis   . DM type 2 (diabetes mellitus, type 2) ([place]) 01/28/2016  . Pressure ulcer 01/23/2016  . Pulmonary embolism with acute cor pulmonale ([place])   . Pleural effusion on right   . Shortness of breath   . Hypoxemia   . Pulmonary embolism ([place]) 01/22/2016  . Pleural effusion   . Pericardial effusion   . Rheumatoid arthritis with positive rheumatoid factor (HCC)   . Mycotic toenails 07/23/2013   Current Outpatient Prescriptions on File Prior to Visit  Medication Sig Dispense Refill  . ALPRAZolam (XANAX) 0.25 MG tablet Take 1 tablet (0.25 mg total) by mouth 2 (two) times daily as needed for anxiety. 30 tablet 0  . aspirin EC 81 MG tablet Take 81 mg by mouth daily. Reported on 03/06/2016    . cadexomer iodine (IODOSORB) 0.9 % gel Apply 1 application topically daily as needed for wound care. Right foot bottom at open area 40 g 1  . furosemide (LASIX) 20 MG tablet Take 10 mg by mouth daily as needed.   0  . glimepiride (AMARYL) 1 MG tablet Take 1 mg by mouth 2 (two) times daily.  0  . HYDROcodone-acetaminophen (NORCO) 10-325 MG tablet Take 1 tablet by mouth every 8 (eight) hours as needed (pain). 15 tablet 0  . Insulin Glargine (LANTUS SOLOSTAR) 100 UNIT/ML Solostar Pen Inject 10 Units into the skin at bedtime. 15 mL 11  . levalbuterol (XOPENEX) 0.63 MG/3ML nebulizer solution Take 3 mLs (0.63 mg  total) by nebulization every 6 (six) hours as needed for wheezing or shortness of breath. (Patient not taking: Reported on 03/06/2016) 500 mL 1  . levothyroxine (SYNTHROID, LEVOTHROID) 100 MCG tablet Take 100 mcg by mouth daily.  0  . Liniments (BLUE-EMU SUPER STRENGTH EX) Apply 1 application topically daily as needed (neck pain).    . metoprolol (LOPRESSOR) 25 MG tablet Take 1 tablet (25 mg total) by mouth 2 (two) times daily. 60 tablet 0  . omeprazole (PRILOSEC) 20 MG capsule Take 20 mg by mouth daily.    . pantoprazole (PROTONIX) 40 MG tablet Take 1 tablet (40 mg total) by mouth daily. (Patient not taking: Reported on 03/06/2016) 30 tablet 0  . predniSONE (DELTASONE) 10 MG tablet Take 1 tablet (10 mg total) by mouth daily with breakfast. 30 tablet 0  . silver sulfADIAZINE (SILVADENE) 1 % cream Apply 1 application topically daily. To right foot ulcer 50 g 0  . simvastatin (ZOCOR) 20 MG tablet Take 20 mg by mouth daily.    Marland Kitchen zolpidem (AMBIEN) 5 MG tablet Take 1 tablet (5 mg total) by mouth at bedtime as needed for sleep. (Patient not taking: Reported on 03/06/2016) 30 tablet 0   Current Facility-Administered Medications on File Prior to Visit  Medication Dose Route Frequency Provider Last Rate Last Dose  . triamcinolone acetonide (KENALOG) 10 MG/ML injection 10  mg  10 mg Other Once Landis Martins, DPM       Allergies  Allergen Reactions  . Doxycycline     Pancreatitis with nausea  . Cephalexin Other (See Comments)    Pt states since she had meningitis, she not able to take antibiotic, but did not give reactions.   . Codeine Nausea And Vomiting    Objective: General: Patient is awake, alert, and oriented x 3 and in no acute distress.  Integument: Skin is warm, dry and supple bilateral. Nails are elonagted and thickened, consistent with onychomycosis, 1-5 bilateral. There is a fluctuant blister to right 1st toe once debrided revealing partial thickness ulceration <0.5cm with granular base and not  other signs of infection. There is ecchymosis to hallux and right lower leg from dog jumping on her. Right heel there is blanchable erythema possibly from shoe rubbing.   Healed ulceration sub-met 2, Right foot with no underlying opening, no surrounding signs of infection. No active drainage, no erythema, no malodor.  Vasculature:  Dorsalis Pedis pulse 1/4 bilateral. Posterior Tibial pulse  1/4 bilateral. Capillary fill time <3 sec 1-5 bilateral. Scant hair growth to the level of the digits.Temperature gradient within normal limits. + varicosities present bilateral. Trace edema present bilateral.   Neurology: The patient has intact sensation measured with a 5.07/10g Semmes Weinstein Monofilament at all pedal sites bilateral. Vibratory sensation diminished bilateral with tuning fork. No Babinski sign present bilateral.   Musculoskeletal: No tenderness to palpation at the right foot, no tenderness to palpation to pre-ulcerative site sub met 2 and blister/ulcer site at 1st toe right foot. Asymptomatic bunion and hammertoes noted bilateral. Muscular strength within normal limits. No tenderness with calf compression bilateral.  Assessment and Plan: Problem List Items Addressed This Visit    None    Visit Diagnoses    Blister of toe without infection, right, initial encounter    -  Primary   Onychomycosis       Foot pain, bilateral       Diabetic polyneuropathy associated with type 2 diabetes mellitus ([place])         -Examined patient. -Discussed and educated patient on diabetic foot care, especially with regards to the vascular, neurological and musculoskeletal systems.  -Stressed the importance of good glycemic control and the detriment of not controlling glucose levels in relation to the foot. -Debrided blister revealing ulceration at right 1st toe and applied idosorb and bandaid dressing; patient to have son help to do the same at home daily -Recommend cushion to shoes to prevent rubbing and  irritation  -Nails x 10 were debrided using sterile nail nipper without incident  -Right foot ulcer is healed; continue with offloading diabetic inserts; patient is awaiting new diabetic shoes; paperwork was given to patient to take to PCP to be signed of which still has not been done -Answered all  patient questions -Patient to return in 4 weeks for f/u on right 1st toe blister or sooner if condition worsens.   Landis Martins, DPM

## 2016-12-27 ENCOUNTER — Other Ambulatory Visit: Payer: Self-pay | Admitting: Sports Medicine

## 2016-12-27 ENCOUNTER — Other Ambulatory Visit: Payer: Self-pay

## 2016-12-27 ENCOUNTER — Other Ambulatory Visit: Payer: Medicare Other | Admitting: Sports Medicine

## 2016-12-27 DIAGNOSIS — L02619 Cutaneous abscess of unspecified foot: Secondary | ICD-10-CM

## 2016-12-27 DIAGNOSIS — L03039 Cellulitis of unspecified toe: Principal | ICD-10-CM

## 2016-12-27 DIAGNOSIS — L97511 Non-pressure chronic ulcer of other part of right foot limited to breakdown of skin: Secondary | ICD-10-CM

## 2016-12-27 DIAGNOSIS — R110 Nausea: Secondary | ICD-10-CM

## 2016-12-27 MED ORDER — CLINDAMYCIN HCL 300 MG PO CAPS: 300.0000 mg | ORAL_CAPSULE | Freq: Three times a day (TID) | ORAL | 0 refills | Status: DC

## 2016-12-27 MED ORDER — PROMETHAZINE HCL 25 MG PO TABS: 25.0000 mg | ORAL_TABLET | Freq: Three times a day (TID) | ORAL | 0 refills | Status: AC | PRN

## 2016-12-27 MED ORDER — PROMETHAZINE HCL 25 MG PO TABS: 25.0000 mg | ORAL_TABLET | Freq: Three times a day (TID) | ORAL | 0 refills | Status: DC | PRN

## 2016-12-27 MED ORDER — CLINDAMYCIN HCL 150 MG PO CAPS: 300.0000 mg | ORAL_CAPSULE | Freq: Three times a day (TID) | ORAL | Status: AC

## 2016-12-27 NOTE — Progress Notes (Signed)
Rx Clindamycin for toe cellulitis and Phenergan for nausea was sent in by nurse. Patient came to office while I was in surgery and was instructed to go toe ER if cellulitis is not better in 24 hours and to have son to monitor and to dress toe ulcer with antibiotic cream. -Dr. Marylene Land

## 2017-01-01 ENCOUNTER — Encounter: Payer: Self-pay | Admitting: Sports Medicine

## 2017-01-01 ENCOUNTER — Ambulatory Visit (INDEPENDENT_AMBULATORY_CARE_PROVIDER_SITE_OTHER): Payer: Medicare Other | Admitting: Sports Medicine

## 2017-01-01 DIAGNOSIS — L02611 Cutaneous abscess of right foot: Secondary | ICD-10-CM

## 2017-01-01 DIAGNOSIS — E1165 Type 2 diabetes mellitus with hyperglycemia: Secondary | ICD-10-CM | POA: Diagnosis not present

## 2017-01-01 DIAGNOSIS — L03031 Cellulitis of right toe: Secondary | ICD-10-CM

## 2017-01-01 DIAGNOSIS — L97514 Non-pressure chronic ulcer of other part of right foot with necrosis of bone: Secondary | ICD-10-CM | POA: Diagnosis not present

## 2017-01-01 DIAGNOSIS — M069 Rheumatoid arthritis, unspecified: Secondary | ICD-10-CM | POA: Diagnosis not present

## 2017-01-01 DIAGNOSIS — I10 Essential (primary) hypertension: Secondary | ICD-10-CM

## 2017-01-01 DIAGNOSIS — E039 Hypothyroidism, unspecified: Secondary | ICD-10-CM | POA: Diagnosis not present

## 2017-01-01 DIAGNOSIS — E1142 Type 2 diabetes mellitus with diabetic polyneuropathy: Secondary | ICD-10-CM | POA: Diagnosis not present

## 2017-01-01 DIAGNOSIS — L89611 Pressure ulcer of right heel, stage 1: Secondary | ICD-10-CM | POA: Diagnosis not present

## 2017-01-01 DIAGNOSIS — E11621 Type 2 diabetes mellitus with foot ulcer: Secondary | ICD-10-CM | POA: Diagnosis not present

## 2017-01-01 DIAGNOSIS — E875 Hyperkalemia: Secondary | ICD-10-CM | POA: Diagnosis not present

## 2017-01-01 DIAGNOSIS — I5032 Chronic diastolic (congestive) heart failure: Secondary | ICD-10-CM | POA: Diagnosis not present

## 2017-01-01 DIAGNOSIS — E872 Acidosis: Secondary | ICD-10-CM

## 2017-01-01 DIAGNOSIS — L03115 Cellulitis of right lower limb: Secondary | ICD-10-CM | POA: Diagnosis not present

## 2017-01-01 DIAGNOSIS — Z7952 Long term (current) use of systemic steroids: Secondary | ICD-10-CM | POA: Diagnosis not present

## 2017-01-01 DIAGNOSIS — K219 Gastro-esophageal reflux disease without esophagitis: Secondary | ICD-10-CM

## 2017-01-01 DIAGNOSIS — I2510 Atherosclerotic heart disease of native coronary artery without angina pectoris: Secondary | ICD-10-CM | POA: Diagnosis not present

## 2017-01-01 NOTE — Progress Notes (Signed)
Patient ID: Kirsten Gallegos, female   DOB: Jan 14, 1936, 81 y.o.   MRN: [id number]  Subjective: Kirsten Gallegos is a 81 y.o. female patient with history of type 2 diabetes who returns to office today for follow-up evaluation of Right ulceration; patient came into office last week for diabetic shoe fitting and it was noticed to have worsening of blister thus patient was started on Clindamycin and given phenergan for nausea. Patient reports that she feel bad and that redness has not changed. Patient has son apply antibiotic cream to area and reports that she has been using post op shoe with pain present to back of right heel. Patient denies any other pedal complaints or concerns at this time.   Patient Active Problem List   Diagnosis Date Noted  . Encounter for chest tube removal   . Emesis   . DM type 2 (diabetes mellitus, type 2) ([place]) 01/28/2016  . Pressure ulcer 01/23/2016  . Pulmonary embolism with acute cor pulmonale ([place])   . Pleural effusion on right   . Shortness of breath   . Hypoxemia   . Pulmonary embolism ([place]) 01/22/2016  . Pleural effusion   . Pericardial effusion   . Rheumatoid arthritis with positive rheumatoid factor (HCC)   . Mycotic toenails 07/23/2013   Current Outpatient Prescriptions on File Prior to Visit  Medication Sig Dispense Refill  . ALPRAZolam (XANAX) 0.25 MG tablet Take 1 tablet (0.25 mg total) by mouth 2 (two) times daily as needed for anxiety. 30 tablet 0  . aspirin EC 81 MG tablet Take 81 mg by mouth daily. Reported on 03/06/2016    . cadexomer iodine (IODOSORB) 0.9 % gel Apply 1 application topically daily as needed for wound care. Right foot bottom at open area 40 g 1  . clindamycin (CLEOCIN) 300 MG capsule Take 1 capsule (300 mg total) by mouth 3 (three) times daily. 30 capsule 0  . furosemide (LASIX) 20 MG tablet Take 10 mg by mouth daily as needed.   0  . glimepiride (AMARYL) 1 MG tablet Take 1 mg by mouth 2 (two) times daily.  0  .  HYDROcodone-acetaminophen (NORCO) 10-325 MG tablet Take 1 tablet by mouth every 8 (eight) hours as needed (pain). 15 tablet 0  . Insulin Glargine (LANTUS SOLOSTAR) 100 UNIT/ML Solostar Pen Inject 10 Units into the skin at bedtime. 15 mL 11  . levalbuterol (XOPENEX) 0.63 MG/3ML nebulizer solution Take 3 mLs (0.63 mg total) by nebulization every 6 (six) hours as needed for wheezing or shortness of breath. (Patient not taking: Reported on 03/06/2016) 500 mL 1  . levothyroxine (SYNTHROID, LEVOTHROID) 100 MCG tablet Take 100 mcg by mouth daily.  0  . Liniments (BLUE-EMU SUPER STRENGTH EX) Apply 1 application topically daily as needed (neck pain).    . metoprolol (LOPRESSOR) 25 MG tablet Take 1 tablet (25 mg total) by mouth 2 (two) times daily. 60 tablet 0  . omeprazole (PRILOSEC) 20 MG capsule Take 20 mg by mouth daily.    . pantoprazole (PROTONIX) 40 MG tablet Take 1 tablet (40 mg total) by mouth daily. (Patient not taking: Reported on 03/06/2016) 30 tablet 0  . predniSONE (DELTASONE) 10 MG tablet Take 1 tablet (10 mg total) by mouth daily with breakfast. 30 tablet 0  . promethazine (PHENERGAN) 25 MG tablet Take 1 tablet (25 mg total) by mouth every 8 (eight) hours as needed for nausea or vomiting. 20 tablet 0  . silver sulfADIAZINE (SILVADENE) 1 % cream Apply 1 application topically  daily. To right foot ulcer 50 g 0  . simvastatin (ZOCOR) 20 MG tablet Take 20 mg by mouth daily.    Marland Kitchen zolpidem (AMBIEN) 5 MG tablet Take 1 tablet (5 mg total) by mouth at bedtime as needed for sleep. (Patient not taking: Reported on 03/06/2016) 30 tablet 0   Current Facility-Administered Medications on File Prior to Visit  Medication Dose Route Frequency Provider Last Rate Last Dose  . clindamycin (CLEOCIN) capsule 300 mg  300 mg Oral Q8H Kirsten Gallegos, DPM      . triamcinolone acetonide (KENALOG) 10 MG/ML injection 10 mg  10 mg Other Once Kirsten Gallegos, DPM       Allergies  Allergen Reactions  . Doxycycline      Pancreatitis with nausea  . Cephalexin Other (See Comments)    Pt states since she had meningitis, she not able to take antibiotic, but did not give reactions.   . Codeine Nausea And Vomiting    Objective: General: Patient is awake, alert, and oriented x 3 and in no acute distress.  Integument: Skin is warm, dry and supple bilateral. Nails are short and thickened, consistent with onychomycosis, 1-5 bilateral. There is a full thickness ulceration <0.5cm that probes to bone with focal erythema concerning for not improved toe infection and osteomyelitis. Right heel there is blanchable erythema, stage 1 pressure sore possibly from shoe rubbing.   Healed ulceration sub-met 2, Right foot with no underlying opening, no surrounding signs of infection. No active drainage, no erythema, no malodor.  Vasculature:  Dorsalis Pedis pulse 1/4 bilateral. Posterior Tibial pulse  1/4 bilateral. Capillary fill time <3 sec 1-5 bilateral. Scant hair growth to the level of the digits.Temperature gradient within normal limits. + varicosities present bilateral. Trace edema present bilateral R>L.    Neurology: The patient has intact sensation measured with a 5.07/10g Semmes Weinstein Monofilament at all pedal sites bilateral. Vibratory sensation diminished bilateral with tuning fork. No Babinski sign present bilateral.   Musculoskeletal: No tenderness to palpation at the right foot, no tenderness to palpation to pre-ulcerative site sub met 2 however there is tenderness at 1st toe and heel on right foot. Asymptomatic bunion and hammertoes noted bilateral. Muscular strength within normal limits. No tenderness with calf compression bilateral.  Assessment and Plan: Problem List Items Addressed This Visit    None    Visit Diagnoses    Toe ulcer, right, with necrosis of bone ([place])    -  Primary   Probable   Cellulitis and abscess of toe of right foot       Pressure ulcer of right heel, stage 1         -Examined  patient. -Applied antibiotic cream to right 1st toe -Called ER and talked with Dr. Lanny Gallegos for admission for IV antibiotics and CT scan for concern of osteomyelitis at right 1st toe; Will follow up with patient as inpatient for continued management -Recommend offloading heel to prevent worsening stage 1 ulcer to right heel  -Continue with post op shoe -Answered all  patient questions -Patient to return after hospital or sooner if condition worsens.   Kirsten Gallegos, DPM

## 2017-01-02 ENCOUNTER — Encounter: Payer: Self-pay | Admitting: Sports Medicine

## 2017-01-02 DIAGNOSIS — E11621 Type 2 diabetes mellitus with foot ulcer: Secondary | ICD-10-CM

## 2017-01-02 DIAGNOSIS — I10 Essential (primary) hypertension: Secondary | ICD-10-CM | POA: Diagnosis not present

## 2017-01-02 DIAGNOSIS — E1142 Type 2 diabetes mellitus with diabetic polyneuropathy: Secondary | ICD-10-CM | POA: Diagnosis not present

## 2017-01-02 DIAGNOSIS — E039 Hypothyroidism, unspecified: Secondary | ICD-10-CM | POA: Diagnosis not present

## 2017-01-02 DIAGNOSIS — E1165 Type 2 diabetes mellitus with hyperglycemia: Secondary | ICD-10-CM | POA: Diagnosis not present

## 2017-01-02 DIAGNOSIS — L03115 Cellulitis of right lower limb: Secondary | ICD-10-CM | POA: Diagnosis not present

## 2017-01-02 DIAGNOSIS — K219 Gastro-esophageal reflux disease without esophagitis: Secondary | ICD-10-CM | POA: Diagnosis not present

## 2017-01-02 DIAGNOSIS — I2510 Atherosclerotic heart disease of native coronary artery without angina pectoris: Secondary | ICD-10-CM | POA: Diagnosis not present

## 2017-01-02 DIAGNOSIS — Z7952 Long term (current) use of systemic steroids: Secondary | ICD-10-CM | POA: Diagnosis not present

## 2017-01-02 DIAGNOSIS — L8991 Pressure ulcer of unspecified site, stage 1: Secondary | ICD-10-CM | POA: Diagnosis not present

## 2017-01-02 DIAGNOSIS — E875 Hyperkalemia: Secondary | ICD-10-CM | POA: Diagnosis not present

## 2017-01-02 DIAGNOSIS — M069 Rheumatoid arthritis, unspecified: Secondary | ICD-10-CM | POA: Diagnosis not present

## 2017-01-02 DIAGNOSIS — I5032 Chronic diastolic (congestive) heart failure: Secondary | ICD-10-CM | POA: Diagnosis not present

## 2017-01-03 DIAGNOSIS — E875 Hyperkalemia: Secondary | ICD-10-CM | POA: Diagnosis not present

## 2017-01-03 DIAGNOSIS — E039 Hypothyroidism, unspecified: Secondary | ICD-10-CM | POA: Diagnosis not present

## 2017-01-03 DIAGNOSIS — I10 Essential (primary) hypertension: Secondary | ICD-10-CM | POA: Diagnosis not present

## 2017-01-03 DIAGNOSIS — M069 Rheumatoid arthritis, unspecified: Secondary | ICD-10-CM | POA: Diagnosis not present

## 2017-01-03 DIAGNOSIS — I2510 Atherosclerotic heart disease of native coronary artery without angina pectoris: Secondary | ICD-10-CM | POA: Diagnosis not present

## 2017-01-03 DIAGNOSIS — E1142 Type 2 diabetes mellitus with diabetic polyneuropathy: Secondary | ICD-10-CM | POA: Diagnosis not present

## 2017-01-03 DIAGNOSIS — M869 Osteomyelitis, unspecified: Secondary | ICD-10-CM

## 2017-01-03 DIAGNOSIS — E11621 Type 2 diabetes mellitus with foot ulcer: Secondary | ICD-10-CM

## 2017-01-03 DIAGNOSIS — L03115 Cellulitis of right lower limb: Secondary | ICD-10-CM | POA: Diagnosis not present

## 2017-01-03 DIAGNOSIS — E1165 Type 2 diabetes mellitus with hyperglycemia: Secondary | ICD-10-CM | POA: Diagnosis not present

## 2017-01-03 DIAGNOSIS — Z7952 Long term (current) use of systemic steroids: Secondary | ICD-10-CM | POA: Diagnosis not present

## 2017-01-03 DIAGNOSIS — K219 Gastro-esophageal reflux disease without esophagitis: Secondary | ICD-10-CM | POA: Diagnosis not present

## 2017-01-03 DIAGNOSIS — I5032 Chronic diastolic (congestive) heart failure: Secondary | ICD-10-CM | POA: Diagnosis not present

## 2017-01-04 DIAGNOSIS — R112 Nausea with vomiting, unspecified: Secondary | ICD-10-CM

## 2017-01-04 DIAGNOSIS — E1165 Type 2 diabetes mellitus with hyperglycemia: Secondary | ICD-10-CM | POA: Diagnosis not present

## 2017-01-04 DIAGNOSIS — I2510 Atherosclerotic heart disease of native coronary artery without angina pectoris: Secondary | ICD-10-CM | POA: Diagnosis not present

## 2017-01-04 DIAGNOSIS — Z7952 Long term (current) use of systemic steroids: Secondary | ICD-10-CM | POA: Diagnosis not present

## 2017-01-04 DIAGNOSIS — K219 Gastro-esophageal reflux disease without esophagitis: Secondary | ICD-10-CM | POA: Diagnosis not present

## 2017-01-04 DIAGNOSIS — I10 Essential (primary) hypertension: Secondary | ICD-10-CM | POA: Diagnosis not present

## 2017-01-04 DIAGNOSIS — M069 Rheumatoid arthritis, unspecified: Secondary | ICD-10-CM | POA: Diagnosis not present

## 2017-01-04 DIAGNOSIS — L03115 Cellulitis of right lower limb: Secondary | ICD-10-CM | POA: Diagnosis not present

## 2017-01-04 DIAGNOSIS — I5032 Chronic diastolic (congestive) heart failure: Secondary | ICD-10-CM | POA: Diagnosis not present

## 2017-01-04 DIAGNOSIS — R109 Unspecified abdominal pain: Secondary | ICD-10-CM

## 2017-01-04 DIAGNOSIS — E875 Hyperkalemia: Secondary | ICD-10-CM | POA: Diagnosis not present

## 2017-01-04 DIAGNOSIS — E1142 Type 2 diabetes mellitus with diabetic polyneuropathy: Secondary | ICD-10-CM | POA: Diagnosis not present

## 2017-01-04 DIAGNOSIS — E11621 Type 2 diabetes mellitus with foot ulcer: Secondary | ICD-10-CM | POA: Diagnosis not present

## 2017-01-04 DIAGNOSIS — E039 Hypothyroidism, unspecified: Secondary | ICD-10-CM | POA: Diagnosis not present

## 2017-01-05 DIAGNOSIS — M069 Rheumatoid arthritis, unspecified: Secondary | ICD-10-CM | POA: Diagnosis not present

## 2017-01-05 DIAGNOSIS — I2510 Atherosclerotic heart disease of native coronary artery without angina pectoris: Secondary | ICD-10-CM | POA: Diagnosis not present

## 2017-01-05 DIAGNOSIS — L03115 Cellulitis of right lower limb: Secondary | ICD-10-CM | POA: Diagnosis not present

## 2017-01-05 DIAGNOSIS — E875 Hyperkalemia: Secondary | ICD-10-CM | POA: Diagnosis not present

## 2017-01-05 DIAGNOSIS — K869 Disease of pancreas, unspecified: Secondary | ICD-10-CM

## 2017-01-05 DIAGNOSIS — I10 Essential (primary) hypertension: Secondary | ICD-10-CM | POA: Diagnosis not present

## 2017-01-05 DIAGNOSIS — K219 Gastro-esophageal reflux disease without esophagitis: Secondary | ICD-10-CM | POA: Diagnosis not present

## 2017-01-05 DIAGNOSIS — E1165 Type 2 diabetes mellitus with hyperglycemia: Secondary | ICD-10-CM | POA: Diagnosis not present

## 2017-01-05 DIAGNOSIS — E1142 Type 2 diabetes mellitus with diabetic polyneuropathy: Secondary | ICD-10-CM | POA: Diagnosis not present

## 2017-01-05 DIAGNOSIS — E039 Hypothyroidism, unspecified: Secondary | ICD-10-CM | POA: Diagnosis not present

## 2017-01-05 DIAGNOSIS — E11621 Type 2 diabetes mellitus with foot ulcer: Secondary | ICD-10-CM | POA: Diagnosis not present

## 2017-01-05 DIAGNOSIS — I5032 Chronic diastolic (congestive) heart failure: Secondary | ICD-10-CM | POA: Diagnosis not present

## 2017-01-05 DIAGNOSIS — Z7952 Long term (current) use of systemic steroids: Secondary | ICD-10-CM | POA: Diagnosis not present

## 2017-01-06 DIAGNOSIS — Z7952 Long term (current) use of systemic steroids: Secondary | ICD-10-CM | POA: Diagnosis not present

## 2017-01-06 DIAGNOSIS — E1142 Type 2 diabetes mellitus with diabetic polyneuropathy: Secondary | ICD-10-CM | POA: Diagnosis not present

## 2017-01-06 DIAGNOSIS — E039 Hypothyroidism, unspecified: Secondary | ICD-10-CM | POA: Diagnosis not present

## 2017-01-06 DIAGNOSIS — I5032 Chronic diastolic (congestive) heart failure: Secondary | ICD-10-CM | POA: Diagnosis not present

## 2017-01-06 DIAGNOSIS — E1165 Type 2 diabetes mellitus with hyperglycemia: Secondary | ICD-10-CM | POA: Diagnosis not present

## 2017-01-06 DIAGNOSIS — E875 Hyperkalemia: Secondary | ICD-10-CM | POA: Diagnosis not present

## 2017-01-06 DIAGNOSIS — L03115 Cellulitis of right lower limb: Secondary | ICD-10-CM | POA: Diagnosis not present

## 2017-01-06 DIAGNOSIS — I10 Essential (primary) hypertension: Secondary | ICD-10-CM | POA: Diagnosis not present

## 2017-01-06 DIAGNOSIS — E11621 Type 2 diabetes mellitus with foot ulcer: Secondary | ICD-10-CM | POA: Diagnosis not present

## 2017-01-06 DIAGNOSIS — K219 Gastro-esophageal reflux disease without esophagitis: Secondary | ICD-10-CM | POA: Diagnosis not present

## 2017-01-06 DIAGNOSIS — I2510 Atherosclerotic heart disease of native coronary artery without angina pectoris: Secondary | ICD-10-CM | POA: Diagnosis not present

## 2017-01-06 DIAGNOSIS — M069 Rheumatoid arthritis, unspecified: Secondary | ICD-10-CM | POA: Diagnosis not present

## 2017-01-07 DIAGNOSIS — E875 Hyperkalemia: Secondary | ICD-10-CM | POA: Diagnosis not present

## 2017-01-07 DIAGNOSIS — E1143 Type 2 diabetes mellitus with diabetic autonomic (poly)neuropathy: Secondary | ICD-10-CM

## 2017-01-07 DIAGNOSIS — L03115 Cellulitis of right lower limb: Secondary | ICD-10-CM | POA: Diagnosis not present

## 2017-01-07 DIAGNOSIS — I2510 Atherosclerotic heart disease of native coronary artery without angina pectoris: Secondary | ICD-10-CM | POA: Diagnosis not present

## 2017-01-07 DIAGNOSIS — E039 Hypothyroidism, unspecified: Secondary | ICD-10-CM | POA: Diagnosis not present

## 2017-01-07 DIAGNOSIS — K219 Gastro-esophageal reflux disease without esophagitis: Secondary | ICD-10-CM | POA: Diagnosis not present

## 2017-01-07 DIAGNOSIS — M069 Rheumatoid arthritis, unspecified: Secondary | ICD-10-CM | POA: Diagnosis not present

## 2017-01-07 DIAGNOSIS — E1142 Type 2 diabetes mellitus with diabetic polyneuropathy: Secondary | ICD-10-CM | POA: Diagnosis not present

## 2017-01-07 DIAGNOSIS — I10 Essential (primary) hypertension: Secondary | ICD-10-CM | POA: Diagnosis not present

## 2017-01-07 DIAGNOSIS — Z7952 Long term (current) use of systemic steroids: Secondary | ICD-10-CM | POA: Diagnosis not present

## 2017-01-07 DIAGNOSIS — E11621 Type 2 diabetes mellitus with foot ulcer: Secondary | ICD-10-CM | POA: Diagnosis not present

## 2017-01-07 DIAGNOSIS — I5032 Chronic diastolic (congestive) heart failure: Secondary | ICD-10-CM | POA: Diagnosis not present

## 2017-01-07 DIAGNOSIS — E1165 Type 2 diabetes mellitus with hyperglycemia: Secondary | ICD-10-CM | POA: Diagnosis not present

## 2017-01-08 ENCOUNTER — Ambulatory Visit: Payer: Medicare Other | Admitting: Sports Medicine

## 2017-01-09 ENCOUNTER — Telehealth: Payer: Self-pay

## 2017-01-09 NOTE — Telephone Encounter (Signed)
Called and spoke with daughter concerning Kirsten Gallegos's diabetic shoes since Kirsten Gallegos is at assisted living right now I am unable to get in touch with her.  Kirsten Gallegos will be coming by 3/14 to take care of her diabetic shoes.

## 2017-01-17 ENCOUNTER — Ambulatory Visit: Payer: Medicare Other | Admitting: Sports Medicine

## 2017-01-23 ENCOUNTER — Encounter: Payer: Self-pay | Admitting: Sports Medicine

## 2017-01-23 ENCOUNTER — Ambulatory Visit: Payer: Medicare Other | Admitting: Sports Medicine

## 2017-01-23 ENCOUNTER — Ambulatory Visit (INDEPENDENT_AMBULATORY_CARE_PROVIDER_SITE_OTHER): Payer: Medicare Other | Admitting: Sports Medicine

## 2017-01-23 DIAGNOSIS — L89611 Pressure ulcer of right heel, stage 1: Secondary | ICD-10-CM

## 2017-01-23 DIAGNOSIS — L97514 Non-pressure chronic ulcer of other part of right foot with necrosis of bone: Secondary | ICD-10-CM | POA: Diagnosis not present

## 2017-01-23 DIAGNOSIS — M2040 Other hammer toe(s) (acquired), unspecified foot: Secondary | ICD-10-CM | POA: Diagnosis not present

## 2017-01-23 DIAGNOSIS — E1142 Type 2 diabetes mellitus with diabetic polyneuropathy: Secondary | ICD-10-CM | POA: Diagnosis not present

## 2017-01-23 NOTE — Progress Notes (Signed)
Patient ID: Kirsten Gallegos, female   DOB: 08/26/1936, 82 y.o.   MRN: [id number]  Subjective: Kirsten Gallegos is a 81 y.o. female patient with history of type 2 diabetes who returns to office today for follow-up evaluation of Right ulceration; [facility] osteomyelitis. Patient states that she desires to go home. Patient is assisted by son. Patient denies any other complaints at this time.   Patient Active Problem List   Diagnosis Date Noted  . Encounter for chest tube removal   . Emesis   . DM type 2 (diabetes mellitus, type 2) ([place]) 01/28/2016  . Pressure ulcer 01/23/2016  . Pulmonary embolism with acute cor pulmonale ([place])   . Pleural effusion on right   . Shortness of breath   . Hypoxemia   . Pulmonary embolism ([place]) 01/22/2016  . Pleural effusion   . Pericardial effusion   . Rheumatoid arthritis with positive rheumatoid factor (HCC)   . Mycotic toenails 07/23/2013   Current Outpatient Prescriptions on File Prior to Visit  Medication Sig Dispense Refill  . ALPRAZolam (XANAX) 0.25 MG tablet Take 1 tablet (0.25 mg total) by mouth 2 (two) times daily as needed for anxiety. 30 tablet 0  . aspirin EC 81 MG tablet Take 81 mg by mouth daily. Reported on 03/06/2016    . cadexomer iodine (IODOSORB) 0.9 % gel Apply 1 application topically daily as needed for wound care. Right foot bottom at open area 40 g 1  . clindamycin (CLEOCIN) 300 MG capsule Take 1 capsule (300 mg total) by mouth 3 (three) times daily. 30 capsule 0  . furosemide (LASIX) 20 MG tablet Take 10 mg by mouth daily as needed.   0  . glimepiride (AMARYL) 1 MG tablet Take 1 mg by mouth 2 (two) times daily.  0  . HYDROcodone-acetaminophen (NORCO) 10-325 MG tablet Take 1 tablet by mouth every 8 (eight) hours as needed (pain). 15 tablet 0  . Insulin Glargine (LANTUS SOLOSTAR) 100 UNIT/ML Solostar Pen Inject 10 Units into the skin at bedtime. 15 mL 11  . levalbuterol (XOPENEX) 0.63  MG/3ML nebulizer solution Take 3 mLs (0.63 mg total) by nebulization every 6 (six) hours as needed for wheezing or shortness of breath. (Patient not taking: Reported on 03/06/2016) 500 mL 1  . levothyroxine (SYNTHROID, LEVOTHROID) 100 MCG tablet Take 100 mcg by mouth daily.  0  . Liniments (BLUE-EMU SUPER STRENGTH EX) Apply 1 application topically daily as needed (neck pain).    . metoprolol (LOPRESSOR) 25 MG tablet Take 1 tablet (25 mg total) by mouth 2 (two) times daily. 60 tablet 0  . omeprazole (PRILOSEC) 20 MG capsule Take 20 mg by mouth daily.    . pantoprazole (PROTONIX) 40 MG tablet Take 1 tablet (40 mg total) by mouth daily. (Patient not taking: Reported on 03/06/2016) 30 tablet 0  . predniSONE (DELTASONE) 10 MG tablet Take 1 tablet (10 mg total) by mouth daily with breakfast. 30 tablet 0  . promethazine (PHENERGAN) 25 MG tablet Take 1 tablet (25 mg total) by mouth every 8 (eight) hours as needed for nausea or vomiting. 20 tablet 0  . silver sulfADIAZINE (SILVADENE) 1 % cream Apply 1 application topically daily. To right foot ulcer 50 g 0  . simvastatin (ZOCOR) 20 MG tablet Take 20 mg by mouth daily.    Marland Kitchen zolpidem (AMBIEN) 5 MG tablet Take 1 tablet (5 mg total) by mouth at bedtime as needed for sleep. (Patient not taking:  Reported on 03/06/2016) 30 tablet 0   Current Facility-Administered Medications on File Prior to Visit  Medication Dose Route Frequency Provider Last Rate Last Dose  . triamcinolone acetonide (KENALOG) 10 MG/ML injection 10 mg  10 mg Other Once Landis Martins, DPM       Allergies  Allergen Reactions  . Doxycycline     Pancreatitis with nausea  . Cephalexin Other (See Comments)    Pt states since she had meningitis, she not able to take antibiotic, but did not give reactions.   . Codeine Nausea And Vomiting    Objective: General: Patient is awake, alert, and oriented x 3 and in no acute distress.  Integument: Skin is warm, dry and supple bilateral. Nails are short and  thickened, consistent with onychomycosis, 1-5 bilateral. There is a healed/scabbed over ulceration at right 1st toe plantar aspect with no acute infection. Right heel there is blanchable erythema, stage 1 pressure sore possibly from shoe rubbing that is improving.   Healed ulceration sub-met 2, Right foot with no underlying opening, no surrounding signs of infection. No active drainage, no erythema, no malodor.  Vasculature:  Dorsalis Pedis pulse 1/4 bilateral. Posterior Tibial pulse  1/4 bilateral. Capillary fill time <3 sec 1-5 bilateral. Scant hair growth to the level of the digits.Temperature gradient within normal limits. + varicosities present bilateral. Trace edema present bilateral R>L.    Neurology: The patient has intact sensation measured with a 5.07/10g Semmes Weinstein Monofilament at all pedal sites bilateral. Vibratory sensation diminished bilateral with tuning fork. No Babinski sign present bilateral.   Musculoskeletal: No tenderness to palpation at the right foot, no tenderness to palpation to pre-ulcerative site sub met 2. Asymptomatic bunion and hammertoes noted bilateral. Muscular strength within normal limits. No tenderness with calf compression bilateral.  Assessment and Plan: Problem List Items Addressed This Visit    None    Visit Diagnoses    Toe ulcer, right, with necrosis of bone ([place])    -  Primary   ON IV ABX AT [facility] 02-13-17   Pressure ulcer of right heel, stage 1       Diabetic polyneuropathy associated with type 2 diabetes mellitus ([place])         -Examined patient. -Continue with open to air or dry dressing to right hallux since now scabbed over -Recommend continue offloading heel to prevent worsening stage 1 ulcer to right heel  -Continue IV antibiotics until 02-13-17 for osteomyelitis and thereafter patient can be discharge to home with 1-2 week follow up for ulcer check in my office -Dispensed diabetic shoes and insoles to patient with wear  and break in period explained -Answered all  patient questions -Patient to return in 5 weeks or sooner if condition worsens.   Landis Martins, DPM

## 2017-02-06 ENCOUNTER — Telehealth: Payer: Self-pay | Admitting: *Deleted

## 2017-02-06 NOTE — Telephone Encounter (Signed)
Thanks I spoke to Dr. Braulio Conte

## 2017-02-06 NOTE — Telephone Encounter (Signed)
Dr. Braulio Conte - [facility] would like information on this pt.

## 2017-02-27 ENCOUNTER — Ambulatory Visit: Payer: Medicare Other | Admitting: Sports Medicine

## 2017-02-28 ENCOUNTER — Ambulatory Visit (INDEPENDENT_AMBULATORY_CARE_PROVIDER_SITE_OTHER): Payer: Medicare Other | Admitting: Sports Medicine

## 2017-02-28 ENCOUNTER — Encounter: Payer: Self-pay | Admitting: Sports Medicine

## 2017-02-28 DIAGNOSIS — L97514 Non-pressure chronic ulcer of other part of right foot with necrosis of bone: Secondary | ICD-10-CM

## 2017-02-28 DIAGNOSIS — M778 Other enthesopathies, not elsewhere classified: Secondary | ICD-10-CM

## 2017-02-28 DIAGNOSIS — M7751 Other enthesopathy of right foot: Secondary | ICD-10-CM | POA: Diagnosis not present

## 2017-02-28 DIAGNOSIS — M779 Enthesopathy, unspecified: Principal | ICD-10-CM

## 2017-02-28 DIAGNOSIS — E1142 Type 2 diabetes mellitus with diabetic polyneuropathy: Secondary | ICD-10-CM

## 2017-02-28 NOTE — Progress Notes (Signed)
Patient ID: Kirsten Gallegos, female   DOB: Aug 30, 1936, 81 y.o.   MRN: [id number]  Subjective: Kirsten Gallegos is a 81 y.o. female patient with history of type 2 diabetes who returns to office today for follow-up evaluation of Right ulceration with osteomyelitis and increased pain at bases of toes on right foot; patient reports that she was admitted to [place] and had to have her stomach pumped. Patient is assisted by son. Patient denies any other complaints at this time.   Patient Active Problem List   Diagnosis Date Noted  . Encounter for chest tube removal   . Emesis   . DM type 2 (diabetes mellitus, type 2) ([place]) 01/28/2016  . Pressure ulcer 01/23/2016  . Pulmonary embolism with acute cor pulmonale ([place])   . Pleural effusion on right   . Shortness of breath   . Hypoxemia   . Pulmonary embolism ([place]) 01/22/2016  . Pleural effusion   . Pericardial effusion   . Rheumatoid arthritis with positive rheumatoid factor (HCC)   . Mycotic toenails 07/23/2013   Current Outpatient Prescriptions on File Prior to Visit  Medication Sig Dispense Refill  . ALPRAZolam (XANAX) 0.25 MG tablet Take 1 tablet (0.25 mg total) by mouth 2 (two) times daily as needed for anxiety. 30 tablet 0  . aspirin EC 81 MG tablet Take 81 mg by mouth daily. Reported on 03/06/2016    . cadexomer iodine (IODOSORB) 0.9 % gel Apply 1 application topically daily as needed for wound care. Right foot bottom at open area 40 g 1  . clindamycin (CLEOCIN) 300 MG capsule Take 1 capsule (300 mg total) by mouth 3 (three) times daily. 30 capsule 0  . furosemide (LASIX) 20 MG tablet Take 10 mg by mouth daily as needed.   0  . glimepiride (AMARYL) 1 MG tablet Take 1 mg by mouth 2 (two) times daily.  0  . HYDROcodone-acetaminophen (NORCO) 10-325 MG tablet Take 1 tablet by mouth every 8 (eight) hours as needed (pain). 15 tablet 0  . Insulin Glargine (LANTUS SOLOSTAR) 100 UNIT/ML Solostar Pen Inject 10 Units into the skin at bedtime. 15 mL  11  . levalbuterol (XOPENEX) 0.63 MG/3ML nebulizer solution Take 3 mLs (0.63 mg total) by nebulization every 6 (six) hours as needed for wheezing or shortness of breath. (Patient not taking: Reported on 03/06/2016) 500 mL 1  . levothyroxine (SYNTHROID, LEVOTHROID) 100 MCG tablet Take 100 mcg by mouth daily.  0  . Liniments (BLUE-EMU SUPER STRENGTH EX) Apply 1 application topically daily as needed (neck pain).    . metoprolol (LOPRESSOR) 25 MG tablet Take 1 tablet (25 mg total) by mouth 2 (two) times daily. 60 tablet 0  . omeprazole (PRILOSEC) 20 MG capsule Take 20 mg by mouth daily.    . pantoprazole (PROTONIX) 40 MG tablet Take 1 tablet (40 mg total) by mouth daily. (Patient not taking: Reported on 03/06/2016) 30 tablet 0  . predniSONE (DELTASONE) 10 MG tablet Take 1 tablet (10 mg total) by mouth daily with breakfast. 30 tablet 0  . promethazine (PHENERGAN) 25 MG tablet Take 1 tablet (25 mg total) by mouth every 8 (eight) hours as needed for nausea or vomiting. 20 tablet 0  . silver sulfADIAZINE (SILVADENE) 1 % cream Apply 1 application topically daily. To right foot ulcer 50 g 0  . simvastatin (ZOCOR) 20 MG tablet Take 20 mg by mouth daily.    Marland Kitchen zolpidem (AMBIEN) 5 MG tablet Take 1 tablet (5 mg total) by mouth  at bedtime as needed for sleep. (Patient not taking: Reported on 03/06/2016) 30 tablet 0   Current Facility-Administered Medications on File Prior to Visit  Medication Dose Route Frequency Provider Last Rate Last Dose  . triamcinolone acetonide (KENALOG) 10 MG/ML injection 10 mg  10 mg Other Once Landis Martins, DPM       Allergies  Allergen Reactions  . Doxycycline     Pancreatitis with nausea  . Cephalexin Other (See Comments)    Pt states since she had meningitis, she not able to take antibiotic, but did not give reactions.   . Codeine Nausea And Vomiting    Objective: General: Patient is awake, alert, and oriented x 3 and in no acute distress.  Integument: Skin is warm, dry and  supple bilateral. Nails are short and thickened, consistent with onychomycosis, 1-5 bilateral. There is a pinpoint ulceration at right 1st toe plantar aspect with no acute infection.  Healed ulceration sub-met 2, Right foot with no underlying opening, no surrounding signs of infection. No active drainage, no erythema, no malodor.  Vasculature:  Dorsalis Pedis pulse 1/4 bilateral. Posterior Tibial pulse  1/4 bilateral. Capillary fill time <3 sec 1-5 bilateral. Scant hair growth to the level of the digits.Temperature gradient within normal limits. + varicosities present bilateral. Trace edema present bilateral R>L.    Neurology: The patient has intact sensation measured with a 5.07/10g Semmes Weinstein Monofilament at all pedal sites bilateral. Vibratory sensation diminished bilateral with tuning fork. No Babinski sign present bilateral.   Musculoskeletal: + tenderness to palpation at the right foot at lesser MTPJs, no tenderness to palpation to pre-ulcerative site sub met 2. Asymptomatic bunion and hammertoes noted bilateral. Muscular strength within normal limits. No tenderness with calf compression bilateral.  Assessment and Plan: Problem List Items Addressed This Visit    None    Visit Diagnoses    Capsulitis of foot, right    -  Primary   Toe ulcer, right, with necrosis of bone ([place])       Diabetic polyneuropathy associated with type 2 diabetes mellitus ([place])         -Examined patient. -Applied Iodosorb covered with bandaid to right hallux and advised patient to do the same at home   -After oral consent and aseptic prep, injected a mixture containing 1 ml of 2% plain lidocaine, 1 ml 0.5% plain marcaine, 0.5 ml of kenalog 10 and 0.5 ml of dexamethasone phosphate into Right lesser MTPJs without complication. Post-injection care discussed with patient.  -Answered all  patient questions -Patient to return in 3 weeks or sooner if condition worsens.   Landis Martins, DPM

## 2017-03-14 ENCOUNTER — Encounter: Payer: Self-pay | Admitting: Sports Medicine

## 2017-03-14 ENCOUNTER — Ambulatory Visit (INDEPENDENT_AMBULATORY_CARE_PROVIDER_SITE_OTHER): Payer: Medicare Other | Admitting: Sports Medicine

## 2017-03-14 VITALS — BP 131/70 | HR 80 | Temp 97.8°F | Resp 18

## 2017-03-14 DIAGNOSIS — L03031 Cellulitis of right toe: Secondary | ICD-10-CM

## 2017-03-14 DIAGNOSIS — L97514 Non-pressure chronic ulcer of other part of right foot with necrosis of bone: Secondary | ICD-10-CM

## 2017-03-14 DIAGNOSIS — L02611 Cutaneous abscess of right foot: Secondary | ICD-10-CM | POA: Diagnosis not present

## 2017-03-14 DIAGNOSIS — E1142 Type 2 diabetes mellitus with diabetic polyneuropathy: Secondary | ICD-10-CM

## 2017-03-14 NOTE — Progress Notes (Signed)
Patient ID: Kirsten Gallegos, female   DOB: 1936-11-02, 81 y.o.   MRN: [id number]  Subjective: Kirsten Gallegos is a 81 y.o. female patient with history of type 2 diabetes who returns to office today for follow-up evaluation of Right ulceration with osteomyelitis and increased pain at 1st toe on right foot; patient reports that she has a UTI and is currently on antibiotics. Patient is assisted by son who has been dressing area with iodosorb. Patient denies any other complaints at this time.   Patient Active Problem List   Diagnosis Date Noted  . Encounter for chest tube removal   . Emesis   . DM type 2 (diabetes mellitus, type 2) ([place]) 01/28/2016  . Pressure ulcer 01/23/2016  . Pulmonary embolism with acute cor pulmonale ([place])   . Pleural effusion on right   . Shortness of breath   . Hypoxemia   . Pulmonary embolism ([place]) 01/22/2016  . Pleural effusion   . Pericardial effusion   . Rheumatoid arthritis with positive rheumatoid factor (HCC)   . Mycotic toenails 07/23/2013   Current Outpatient Prescriptions on File Prior to Visit  Medication Sig Dispense Refill  . ALPRAZolam (XANAX) 0.25 MG tablet Take 1 tablet (0.25 mg total) by mouth 2 (two) times daily as needed for anxiety. 30 tablet 0  . aspirin EC 81 MG tablet Take 81 mg by mouth daily. Reported on 03/06/2016    . cadexomer iodine (IODOSORB) 0.9 % gel Apply 1 application topically daily as needed for wound care. Right foot bottom at open area 40 g 1  . clindamycin (CLEOCIN) 300 MG capsule Take 1 capsule (300 mg total) by mouth 3 (three) times daily. 30 capsule 0  . furosemide (LASIX) 20 MG tablet Take 10 mg by mouth daily as needed.   0  . glimepiride (AMARYL) 1 MG tablet Take 1 mg by mouth 2 (two) times daily.  0  . HYDROcodone-acetaminophen (NORCO) 10-325 MG tablet Take 1 tablet by mouth every 8 (eight) hours as needed (pain). 15 tablet 0  . Insulin Glargine (LANTUS SOLOSTAR) 100 UNIT/ML Solostar Pen Inject 10 Units into the skin at  bedtime. 15 mL 11  . levalbuterol (XOPENEX) 0.63 MG/3ML nebulizer solution Take 3 mLs (0.63 mg total) by nebulization every 6 (six) hours as needed for wheezing or shortness of breath. (Patient not taking: Reported on 03/06/2016) 500 mL 1  . levothyroxine (SYNTHROID, LEVOTHROID) 100 MCG tablet Take 100 mcg by mouth daily.  0  . Liniments (BLUE-EMU SUPER STRENGTH EX) Apply 1 application topically daily as needed (neck pain).    . metoprolol (LOPRESSOR) 25 MG tablet Take 1 tablet (25 mg total) by mouth 2 (two) times daily. 60 tablet 0  . omeprazole (PRILOSEC) 20 MG capsule Take 20 mg by mouth daily.    . pantoprazole (PROTONIX) 40 MG tablet Take 1 tablet (40 mg total) by mouth daily. (Patient not taking: Reported on 03/06/2016) 30 tablet 0  . predniSONE (DELTASONE) 10 MG tablet Take 1 tablet (10 mg total) by mouth daily with breakfast. 30 tablet 0  . promethazine (PHENERGAN) 25 MG tablet Take 1 tablet (25 mg total) by mouth every 8 (eight) hours as needed for nausea or vomiting. 20 tablet 0  . silver sulfADIAZINE (SILVADENE) 1 % cream Apply 1 application topically daily. To right foot ulcer 50 g 0  . simvastatin (ZOCOR) 20 MG tablet Take 20 mg by mouth daily.    Marland Kitchen zolpidem (AMBIEN) 5 MG tablet Take 1 tablet (5 mg total)  by mouth at bedtime as needed for sleep. (Patient not taking: Reported on 03/06/2016) 30 tablet 0   Current Facility-Administered Medications on File Prior to Visit  Medication Dose Route Frequency Provider Last Rate Last Dose  . triamcinolone acetonide (KENALOG) 10 MG/ML injection 10 mg  10 mg Other Once Landis Martins, DPM       Allergies  Allergen Reactions  . Doxycycline     Pancreatitis with nausea  . Cephalexin Other (See Comments)    Pt states since she had meningitis, she not able to take antibiotic, but did not give reactions.   . Codeine Nausea And Vomiting    Objective: General: Patient is awake, alert, and oriented x 3 and in no acute distress.  Integument: Skin is  warm, dry and supple bilateral. Nails are short and thickened, consistent with onychomycosis, 1-5 bilateral. There is a pinpoint ulceration at right 1st toe plantar aspect once debrided a thick mucus like drainage was expressed and cultured.   Healed ulceration sub-met 2, Right foot with no underlying opening, no surrounding signs of infection. No active drainage, no erythema, no malodor.  Vasculature:  Dorsalis Pedis pulse 1/4 bilateral. Posterior Tibial pulse  1/4 bilateral. Capillary fill time <3 sec 1-5 bilateral. Scant hair growth to the level of the digits.Temperature gradient within normal limits. + varicosities present bilateral. Trace edema present bilateral R>L.    Neurology: The patient has intact sensation measured with a 5.07/10g Semmes Weinstein Monofilament at all pedal sites bilateral. Vibratory sensation diminished bilateral with tuning fork. No Babinski sign present bilateral.   Musculoskeletal: + tenderness to palpation at the right foot at 1st toe, no tenderness to palpation to pre-ulcerative site sub met 2. Asymptomatic bunion and hammertoes noted bilateral. Muscular strength within normal limits. No tenderness with calf compression bilateral.  Assessment and Plan: Problem List Items Addressed This Visit    None    Visit Diagnoses    Cellulitis and abscess of toe of right foot    -  Primary   Relevant Orders   WOUND CULTURE   Toe ulcer, right, with necrosis of bone ([place])       Diabetic polyneuropathy associated with type 2 diabetes mellitus ([place])         -Examined patient. -Debrided right 1st toe ulceration and wound culture obtained and sent to [facility] -Continue with antibiotics for UTI will call if need to add another or if we can order medication for use from [facility] to right hallux and advised patient's son to do the same at home   -Answered all  patient questions -Patient to return in 2-3 weeks or sooner if condition worsens.   Landis Martins, DPM

## 2017-03-16 DIAGNOSIS — E119 Type 2 diabetes mellitus without complications: Secondary | ICD-10-CM | POA: Diagnosis not present

## 2017-03-16 DIAGNOSIS — Z86711 Personal history of pulmonary embolism: Secondary | ICD-10-CM | POA: Diagnosis not present

## 2017-03-16 DIAGNOSIS — R55 Syncope and collapse: Secondary | ICD-10-CM

## 2017-03-16 DIAGNOSIS — E860 Dehydration: Secondary | ICD-10-CM | POA: Diagnosis not present

## 2017-03-16 DIAGNOSIS — E1142 Type 2 diabetes mellitus with diabetic polyneuropathy: Secondary | ICD-10-CM | POA: Diagnosis not present

## 2017-03-16 DIAGNOSIS — E039 Hypothyroidism, unspecified: Secondary | ICD-10-CM | POA: Diagnosis not present

## 2017-03-16 DIAGNOSIS — I10 Essential (primary) hypertension: Secondary | ICD-10-CM | POA: Diagnosis not present

## 2017-03-16 DIAGNOSIS — S42292A Other displaced fracture of upper end of left humerus, initial encounter for closed fracture: Secondary | ICD-10-CM

## 2017-03-16 DIAGNOSIS — I5032 Chronic diastolic (congestive) heart failure: Secondary | ICD-10-CM | POA: Diagnosis not present

## 2017-03-16 DIAGNOSIS — E1165 Type 2 diabetes mellitus with hyperglycemia: Secondary | ICD-10-CM | POA: Diagnosis not present

## 2017-03-16 DIAGNOSIS — J90 Pleural effusion, not elsewhere classified: Secondary | ICD-10-CM | POA: Diagnosis not present

## 2017-03-16 DIAGNOSIS — E875 Hyperkalemia: Secondary | ICD-10-CM | POA: Diagnosis not present

## 2017-03-17 DIAGNOSIS — J90 Pleural effusion, not elsewhere classified: Secondary | ICD-10-CM | POA: Diagnosis not present

## 2017-03-17 DIAGNOSIS — R55 Syncope and collapse: Secondary | ICD-10-CM | POA: Diagnosis not present

## 2017-03-17 DIAGNOSIS — E119 Type 2 diabetes mellitus without complications: Secondary | ICD-10-CM | POA: Diagnosis not present

## 2017-03-17 DIAGNOSIS — E875 Hyperkalemia: Secondary | ICD-10-CM | POA: Diagnosis not present

## 2017-03-17 DIAGNOSIS — I10 Essential (primary) hypertension: Secondary | ICD-10-CM | POA: Diagnosis not present

## 2017-03-17 DIAGNOSIS — E1165 Type 2 diabetes mellitus with hyperglycemia: Secondary | ICD-10-CM | POA: Diagnosis not present

## 2017-03-17 DIAGNOSIS — I5032 Chronic diastolic (congestive) heart failure: Secondary | ICD-10-CM | POA: Diagnosis not present

## 2017-03-17 DIAGNOSIS — S42292A Other displaced fracture of upper end of left humerus, initial encounter for closed fracture: Secondary | ICD-10-CM | POA: Diagnosis not present

## 2017-03-17 DIAGNOSIS — Z86711 Personal history of pulmonary embolism: Secondary | ICD-10-CM | POA: Diagnosis not present

## 2017-03-17 DIAGNOSIS — E039 Hypothyroidism, unspecified: Secondary | ICD-10-CM | POA: Diagnosis not present

## 2017-03-17 DIAGNOSIS — E1142 Type 2 diabetes mellitus with diabetic polyneuropathy: Secondary | ICD-10-CM | POA: Diagnosis not present

## 2017-03-17 DIAGNOSIS — E860 Dehydration: Secondary | ICD-10-CM | POA: Diagnosis not present

## 2017-03-19 ENCOUNTER — Telehealth: Payer: Self-pay | Admitting: Sports Medicine

## 2017-03-19 DIAGNOSIS — E860 Dehydration: Secondary | ICD-10-CM | POA: Diagnosis not present

## 2017-03-19 DIAGNOSIS — Z86711 Personal history of pulmonary embolism: Secondary | ICD-10-CM | POA: Diagnosis not present

## 2017-03-19 DIAGNOSIS — E119 Type 2 diabetes mellitus without complications: Secondary | ICD-10-CM | POA: Diagnosis not present

## 2017-03-19 DIAGNOSIS — R55 Syncope and collapse: Secondary | ICD-10-CM | POA: Diagnosis not present

## 2017-03-19 DIAGNOSIS — E1165 Type 2 diabetes mellitus with hyperglycemia: Secondary | ICD-10-CM | POA: Diagnosis not present

## 2017-03-19 DIAGNOSIS — J90 Pleural effusion, not elsewhere classified: Secondary | ICD-10-CM | POA: Diagnosis not present

## 2017-03-19 DIAGNOSIS — E875 Hyperkalemia: Secondary | ICD-10-CM | POA: Diagnosis not present

## 2017-03-19 DIAGNOSIS — E1142 Type 2 diabetes mellitus with diabetic polyneuropathy: Secondary | ICD-10-CM | POA: Diagnosis not present

## 2017-03-19 DIAGNOSIS — I5032 Chronic diastolic (congestive) heart failure: Secondary | ICD-10-CM | POA: Diagnosis not present

## 2017-03-19 DIAGNOSIS — S42292A Other displaced fracture of upper end of left humerus, initial encounter for closed fracture: Secondary | ICD-10-CM | POA: Diagnosis not present

## 2017-03-19 DIAGNOSIS — I10 Essential (primary) hypertension: Secondary | ICD-10-CM | POA: Diagnosis not present

## 2017-03-19 DIAGNOSIS — E039 Hypothyroidism, unspecified: Secondary | ICD-10-CM | POA: Diagnosis not present

## 2017-03-19 NOTE — Telephone Encounter (Signed)
Pt is currently in the [facility], [place] room 370, she fell and broke her arm. Pt was admitted, per son: after she is discharged, will be going to assisted living for a while.

## 2017-03-20 ENCOUNTER — Telehealth: Payer: Self-pay | Admitting: *Deleted

## 2017-03-20 DIAGNOSIS — E1165 Type 2 diabetes mellitus with hyperglycemia: Secondary | ICD-10-CM | POA: Diagnosis not present

## 2017-03-20 DIAGNOSIS — I5032 Chronic diastolic (congestive) heart failure: Secondary | ICD-10-CM | POA: Diagnosis not present

## 2017-03-20 DIAGNOSIS — I10 Essential (primary) hypertension: Secondary | ICD-10-CM | POA: Diagnosis not present

## 2017-03-20 DIAGNOSIS — E860 Dehydration: Secondary | ICD-10-CM | POA: Diagnosis not present

## 2017-03-20 DIAGNOSIS — E875 Hyperkalemia: Secondary | ICD-10-CM | POA: Diagnosis not present

## 2017-03-20 DIAGNOSIS — E039 Hypothyroidism, unspecified: Secondary | ICD-10-CM | POA: Diagnosis not present

## 2017-03-20 DIAGNOSIS — S42292A Other displaced fracture of upper end of left humerus, initial encounter for closed fracture: Secondary | ICD-10-CM | POA: Diagnosis not present

## 2017-03-20 DIAGNOSIS — J90 Pleural effusion, not elsewhere classified: Secondary | ICD-10-CM | POA: Diagnosis not present

## 2017-03-20 DIAGNOSIS — Z86711 Personal history of pulmonary embolism: Secondary | ICD-10-CM | POA: Diagnosis not present

## 2017-03-20 DIAGNOSIS — R55 Syncope and collapse: Secondary | ICD-10-CM | POA: Diagnosis not present

## 2017-03-20 DIAGNOSIS — E1142 Type 2 diabetes mellitus with diabetic polyneuropathy: Secondary | ICD-10-CM | POA: Diagnosis not present

## 2017-03-20 DIAGNOSIS — E119 Type 2 diabetes mellitus without complications: Secondary | ICD-10-CM | POA: Diagnosis not present

## 2017-03-20 NOTE — Telephone Encounter (Addendum)
-----   Message from Asencion Islam, [place] sent at 03/20/2017  8:35 AM EDT ----- Regarding: Culture results Hi Val Can you let the patient or her son know that wound culture from last week was negative no organisms seen no additional antibiotics needed. Also if patient is still in hospital I will check on her tomorrow -Dr. Marylene Land. Unable to contact pt's son, Volanda Napoleon not set up yet. Unable to contact pt, phone required a remote access code. Left my callback number for Ardeth Perfect to have pt or son, Reuel Boom call.

## 2017-03-20 NOTE — Telephone Encounter (Signed)
Ok thank you. I will stop by and see her in the morning. -Dr. Marylene Land

## 2017-03-21 ENCOUNTER — Ambulatory Visit: Payer: Medicare Other | Admitting: Sports Medicine

## 2017-03-27 ENCOUNTER — Ambulatory Visit: Payer: Medicare Other | Admitting: Sports Medicine

## 2017-04-17 ENCOUNTER — Ambulatory Visit (INDEPENDENT_AMBULATORY_CARE_PROVIDER_SITE_OTHER): Payer: Medicare Other | Admitting: Sports Medicine

## 2017-04-17 DIAGNOSIS — R600 Localized edema: Secondary | ICD-10-CM | POA: Diagnosis not present

## 2017-04-17 DIAGNOSIS — E1142 Type 2 diabetes mellitus with diabetic polyneuropathy: Secondary | ICD-10-CM

## 2017-04-17 DIAGNOSIS — I739 Peripheral vascular disease, unspecified: Secondary | ICD-10-CM | POA: Diagnosis not present

## 2017-04-17 DIAGNOSIS — R58 Hemorrhage, not elsewhere classified: Secondary | ICD-10-CM | POA: Diagnosis not present

## 2017-04-17 DIAGNOSIS — L97514 Non-pressure chronic ulcer of other part of right foot with necrosis of bone: Secondary | ICD-10-CM | POA: Diagnosis not present

## 2017-04-17 NOTE — Progress Notes (Signed)
Patient ID: Kirsten Gallegos, female   DOB: Mar 23, 1936, 81 y.o.   MRN: [id number]  Subjective: Kirsten Gallegos is a 81 y.o. female patient with history of type 2 diabetes who returns to office today for follow-up evaluation of Right ulceration with osteomyelitis, which remains healed and swelling. Patient admits that she fell down and broke her left arm and was admitted to [facility] and since has developed congestive heart failure and has had more swelling in her feet and legs with some pain to all joints and toes. Patient is assisted by son this visit. Patient denies any other complaints at this time.   Patient Active Problem List   Diagnosis Date Noted  . Encounter for chest tube removal   . Emesis   . DM type 2 (diabetes mellitus, type 2) ([place]) 01/28/2016  . Pressure ulcer 01/23/2016  . Pulmonary embolism with acute cor pulmonale ([place])   . Pleural effusion on right   . Shortness of breath   . Hypoxemia   . Pulmonary embolism ([place]) 01/22/2016  . Pleural effusion   . Pericardial effusion   . Rheumatoid arthritis with positive rheumatoid factor (HCC)   . Mycotic toenails 07/23/2013   Current Outpatient Prescriptions on File Prior to Visit  Medication Sig Dispense Refill  . ALPRAZolam (XANAX) 0.25 MG tablet Take 1 tablet (0.25 mg total) by mouth 2 (two) times daily as needed for anxiety. 30 tablet 0  . aspirin EC 81 MG tablet Take 81 mg by mouth daily. Reported on 03/06/2016    . cadexomer iodine (IODOSORB) 0.9 % gel Apply 1 application topically daily as needed for wound care. Right foot bottom at open area 40 g 1  . clindamycin (CLEOCIN) 300 MG capsule Take 1 capsule (300 mg total) by mouth 3 (three) times daily. 30 capsule 0  . furosemide (LASIX) 20 MG tablet Take 10 mg by mouth daily as needed.   0  . glimepiride (AMARYL) 1 MG tablet Take 1 mg by mouth 2 (two) times daily.  0  . HYDROcodone-acetaminophen (NORCO) 10-325 MG tablet Take 1 tablet by mouth every 8 (eight) hours as needed  (pain). 15 tablet 0  . Insulin Glargine (LANTUS SOLOSTAR) 100 UNIT/ML Solostar Pen Inject 10 Units into the skin at bedtime. 15 mL 11  . levalbuterol (XOPENEX) 0.63 MG/3ML nebulizer solution Take 3 mLs (0.63 mg total) by nebulization every 6 (six) hours as needed for wheezing or shortness of breath. (Patient not taking: Reported on 03/06/2016) 500 mL 1  . levothyroxine (SYNTHROID, LEVOTHROID) 100 MCG tablet Take 100 mcg by mouth daily.  0  . Liniments (BLUE-EMU SUPER STRENGTH EX) Apply 1 application topically daily as needed (neck pain).    . metoprolol (LOPRESSOR) 25 MG tablet Take 1 tablet (25 mg total) by mouth 2 (two) times daily. 60 tablet 0  . omeprazole (PRILOSEC) 20 MG capsule Take 20 mg by mouth daily.    . pantoprazole (PROTONIX) 40 MG tablet Take 1 tablet (40 mg total) by mouth daily. (Patient not taking: Reported on 03/06/2016) 30 tablet 0  . predniSONE (DELTASONE) 10 MG tablet Take 1 tablet (10 mg total) by mouth daily with breakfast. 30 tablet 0  . promethazine (PHENERGAN) 25 MG tablet Take 1 tablet (25 mg total) by mouth every 8 (eight) hours as needed for nausea or vomiting. 20 tablet 0  . silver sulfADIAZINE (SILVADENE) 1 % cream Apply 1 application topically daily. To right foot ulcer 50 g 0  . simvastatin (ZOCOR) 20 MG  tablet Take 20 mg by mouth daily.    Marland Kitchen zolpidem (AMBIEN) 5 MG tablet Take 1 tablet (5 mg total) by mouth at bedtime as needed for sleep. (Patient not taking: Reported on 03/06/2016) 30 tablet 0   Current Facility-Administered Medications on File Prior to Visit  Medication Dose Route Frequency Provider Last Rate Last Dose  . triamcinolone acetonide (KENALOG) 10 MG/ML injection 10 mg  10 mg Other Once Landis Martins, DPM       Allergies  Allergen Reactions  . Doxycycline     Pancreatitis with nausea  . Cephalexin Other (See Comments)    Pt states since she had meningitis, she not able to take antibiotic, but did not give reactions.   . Codeine Nausea And Vomiting     Objective: General: Patient is awake, alert, and oriented x 3 and in no acute distress.  Integument: Skin is warm, dry and supple bilateral. Nails are Minimally elongated and thickened, consistent with onychomycosis, 1-5 bilateral. The previous ulceration at the right plantar hallux is healed with no surrounding acute signs of infection. There is no fluctuance or no acute issues, to this area.  Healed ulceration sub-met 2, Right foot with no underlying opening, no surrounding signs of infection. No active drainage, no erythema, no malodor.  Vasculature:  Dorsalis Pedis pulse 1/4 bilateral. Posterior Tibial pulse  1/4 bilateral. Capillary fill time <3 sec 1-5 bilateral. Scant hair growth to the level of the digits.Temperature gradient within normal limits. + varicosities present bilateral. 2+ pitting edema present bilateral.    Neurology: The patient has intact sensation measured with a 5.07/10g Semmes Weinstein Monofilament at all pedal sites bilateral. Vibratory sensation diminished bilateral with tuning fork. No Babinski sign present bilateral.   Musculoskeletal: Diffuse tenderness, bilateral feet and ankles, likely secondary to swelling and history of arthritis Asymptomatic bunion and hammertoes noted bilateral. Muscular strength within normal limits. No tenderness with calf compression bilateral.  Assessment and Plan: Problem List Items Addressed This Visit    None    Visit Diagnoses    Toe ulcer, right, with necrosis of bone ([place])    -  Primary   Ulceration healed   Ecchymosis       Bilateral lower extremity edema       PVD (peripheral vascular disease) ([place])       Diabetic polyneuropathy associated with type 2 diabetes mellitus ([place])         -Examined patient. -Previous ulceration remains healed. Thus, no dressings needed. -Advised patient to rest, elevate and to watch her diet to assist with edema control -Advised patient to follow up with primary care and to pick up Lasix  as prescribed to assist with edema control -Advised patient to apply topical pain creams and rubs that her over-the-counter for any foot pain can safely do this up to 4 times per day -Advised patient a cane or walker for stability in gait, especially since she has a left arm fracture -Patient to return in  4-5  weeksfor diabetic nail trim or sooner if condition worsens.   Landis Martins, DPM

## 2017-04-30 DIAGNOSIS — E119 Type 2 diabetes mellitus without complications: Secondary | ICD-10-CM

## 2017-04-30 DIAGNOSIS — I5030 Unspecified diastolic (congestive) heart failure: Secondary | ICD-10-CM

## 2017-04-30 DIAGNOSIS — I82402 Acute embolism and thrombosis of unspecified deep veins of left lower extremity: Secondary | ICD-10-CM | POA: Diagnosis not present

## 2017-04-30 DIAGNOSIS — K862 Cyst of pancreas: Secondary | ICD-10-CM | POA: Diagnosis not present

## 2017-05-01 DIAGNOSIS — E119 Type 2 diabetes mellitus without complications: Secondary | ICD-10-CM | POA: Diagnosis not present

## 2017-05-01 DIAGNOSIS — I5030 Unspecified diastolic (congestive) heart failure: Secondary | ICD-10-CM | POA: Diagnosis not present

## 2017-05-01 DIAGNOSIS — I82402 Acute embolism and thrombosis of unspecified deep veins of left lower extremity: Secondary | ICD-10-CM | POA: Diagnosis not present

## 2017-05-01 DIAGNOSIS — K862 Cyst of pancreas: Secondary | ICD-10-CM | POA: Diagnosis not present

## 2017-05-02 DIAGNOSIS — I82402 Acute embolism and thrombosis of unspecified deep veins of left lower extremity: Secondary | ICD-10-CM | POA: Diagnosis not present

## 2017-05-02 DIAGNOSIS — E119 Type 2 diabetes mellitus without complications: Secondary | ICD-10-CM | POA: Diagnosis not present

## 2017-05-02 DIAGNOSIS — K862 Cyst of pancreas: Secondary | ICD-10-CM | POA: Diagnosis not present

## 2017-05-02 DIAGNOSIS — I5030 Unspecified diastolic (congestive) heart failure: Secondary | ICD-10-CM | POA: Diagnosis not present

## 2017-05-03 DIAGNOSIS — K862 Cyst of pancreas: Secondary | ICD-10-CM | POA: Diagnosis not present

## 2017-05-03 DIAGNOSIS — E119 Type 2 diabetes mellitus without complications: Secondary | ICD-10-CM | POA: Diagnosis not present

## 2017-05-03 DIAGNOSIS — I82402 Acute embolism and thrombosis of unspecified deep veins of left lower extremity: Secondary | ICD-10-CM | POA: Diagnosis not present

## 2017-05-03 DIAGNOSIS — I5030 Unspecified diastolic (congestive) heart failure: Secondary | ICD-10-CM | POA: Diagnosis not present

## 2017-05-04 DIAGNOSIS — E119 Type 2 diabetes mellitus without complications: Secondary | ICD-10-CM | POA: Diagnosis not present

## 2017-05-04 DIAGNOSIS — I5030 Unspecified diastolic (congestive) heart failure: Secondary | ICD-10-CM | POA: Diagnosis not present

## 2017-05-04 DIAGNOSIS — K862 Cyst of pancreas: Secondary | ICD-10-CM | POA: Diagnosis not present

## 2017-05-04 DIAGNOSIS — I82402 Acute embolism and thrombosis of unspecified deep veins of left lower extremity: Secondary | ICD-10-CM | POA: Diagnosis not present

## 2017-05-05 DIAGNOSIS — E119 Type 2 diabetes mellitus without complications: Secondary | ICD-10-CM | POA: Diagnosis not present

## 2017-05-05 DIAGNOSIS — K862 Cyst of pancreas: Secondary | ICD-10-CM | POA: Diagnosis not present

## 2017-05-05 DIAGNOSIS — I5030 Unspecified diastolic (congestive) heart failure: Secondary | ICD-10-CM | POA: Diagnosis not present

## 2017-05-05 DIAGNOSIS — I82402 Acute embolism and thrombosis of unspecified deep veins of left lower extremity: Secondary | ICD-10-CM | POA: Diagnosis not present

## 2017-05-22 ENCOUNTER — Ambulatory Visit (INDEPENDENT_AMBULATORY_CARE_PROVIDER_SITE_OTHER): Payer: Medicare Other | Admitting: Sports Medicine

## 2017-05-22 DIAGNOSIS — M79674 Pain in right toe(s): Secondary | ICD-10-CM

## 2017-05-22 DIAGNOSIS — I82402 Acute embolism and thrombosis of unspecified deep veins of left lower extremity: Secondary | ICD-10-CM

## 2017-05-22 DIAGNOSIS — I739 Peripheral vascular disease, unspecified: Secondary | ICD-10-CM

## 2017-05-22 DIAGNOSIS — R58 Hemorrhage, not elsewhere classified: Secondary | ICD-10-CM

## 2017-05-22 DIAGNOSIS — R600 Localized edema: Secondary | ICD-10-CM

## 2017-05-22 DIAGNOSIS — E1142 Type 2 diabetes mellitus with diabetic polyneuropathy: Secondary | ICD-10-CM

## 2017-05-22 DIAGNOSIS — M79675 Pain in left toe(s): Secondary | ICD-10-CM | POA: Diagnosis not present

## 2017-05-22 DIAGNOSIS — B351 Tinea unguium: Secondary | ICD-10-CM | POA: Diagnosis not present

## 2017-05-22 DIAGNOSIS — L97514 Non-pressure chronic ulcer of other part of right foot with necrosis of bone: Secondary | ICD-10-CM

## 2017-05-23 NOTE — Progress Notes (Signed)
Patient ID: Trang Bouse, female   DOB: 1936-05-08, 81 y.o.   MRN: [id number]  Subjective: Anquanette Bahner is a 81 y.o. female patient with history of type 2 diabetes who returns to office today for follow-up evaluation of Right ulceration with osteomyelitis, which remains healed and swelling. Patient states that she was hospitalized for 6 days and was found to have a blood clot and left leg now on blood thinners. Patient states that she is now here to have her nails trimmed. Patient is assisted by son this visit. Patient denies any other complaints at this time.   Patient Active Problem List   Diagnosis Date Noted  . Encounter for chest tube removal   . Emesis   . DM type 2 (diabetes mellitus, type 2) ([place]) 01/28/2016  . Pressure ulcer 01/23/2016  . Pulmonary embolism with acute cor pulmonale ([place])   . Pleural effusion on right   . Shortness of breath   . Hypoxemia   . Pulmonary embolism ([place]) 01/22/2016  . Pleural effusion   . Pericardial effusion   . Rheumatoid arthritis with positive rheumatoid factor (HCC)   . Mycotic toenails 07/23/2013   Current Outpatient Prescriptions on File Prior to Visit  Medication Sig Dispense Refill  . ALPRAZolam (XANAX) 0.25 MG tablet Take 1 tablet (0.25 mg total) by mouth 2 (two) times daily as needed for anxiety. 30 tablet 0  . aspirin EC 81 MG tablet Take 81 mg by mouth daily. Reported on 03/06/2016    . cadexomer iodine (IODOSORB) 0.9 % gel Apply 1 application topically daily as needed for wound care. Right foot bottom at open area 40 g 1  . clindamycin (CLEOCIN) 300 MG capsule Take 1 capsule (300 mg total) by mouth 3 (three) times daily. 30 capsule 0  . furosemide (LASIX) 20 MG tablet Take 10 mg by mouth daily as needed.   0  . glimepiride (AMARYL) 1 MG tablet Take 1 mg by mouth 2 (two) times daily.  0  . HYDROcodone-acetaminophen (NORCO) 10-325 MG tablet Take 1 tablet by mouth every 8 (eight) hours as needed (pain). 15 tablet 0  . Insulin  Glargine (LANTUS SOLOSTAR) 100 UNIT/ML Solostar Pen Inject 10 Units into the skin at bedtime. 15 mL 11  . levalbuterol (XOPENEX) 0.63 MG/3ML nebulizer solution Take 3 mLs (0.63 mg total) by nebulization every 6 (six) hours as needed for wheezing or shortness of breath. (Patient not taking: Reported on 03/06/2016) 500 mL 1  . levothyroxine (SYNTHROID, LEVOTHROID) 100 MCG tablet Take 100 mcg by mouth daily.  0  . Liniments (BLUE-EMU SUPER STRENGTH EX) Apply 1 application topically daily as needed (neck pain).    . metoprolol (LOPRESSOR) 25 MG tablet Take 1 tablet (25 mg total) by mouth 2 (two) times daily. 60 tablet 0  . omeprazole (PRILOSEC) 20 MG capsule Take 20 mg by mouth daily.    . pantoprazole (PROTONIX) 40 MG tablet Take 1 tablet (40 mg total) by mouth daily. (Patient not taking: Reported on 03/06/2016) 30 tablet 0  . predniSONE (DELTASONE) 10 MG tablet Take 1 tablet (10 mg total) by mouth daily with breakfast. 30 tablet 0  . promethazine (PHENERGAN) 25 MG tablet Take 1 tablet (25 mg total) by mouth every 8 (eight) hours as needed for nausea or vomiting. 20 tablet 0  . silver sulfADIAZINE (SILVADENE) 1 % cream Apply 1 application topically daily. To right foot ulcer 50 g 0  . simvastatin (ZOCOR) 20 MG tablet Take 20 mg by  mouth daily.    Marland Kitchen zolpidem (AMBIEN) 5 MG tablet Take 1 tablet (5 mg total) by mouth at bedtime as needed for sleep. (Patient not taking: Reported on 03/06/2016) 30 tablet 0   Current Facility-Administered Medications on File Prior to Visit  Medication Dose Route Frequency Provider Last Rate Last Dose  . triamcinolone acetonide (KENALOG) 10 MG/ML injection 10 mg  10 mg Other Once Landis Martins, DPM       Allergies  Allergen Reactions  . Doxycycline     Pancreatitis with nausea  . Cephalexin Other (See Comments)    Pt states since she had meningitis, she not able to take antibiotic, but did not give reactions.   . Codeine Nausea And Vomiting    Objective: General: Patient  is awake, alert, and oriented x 3 and in no acute distress.  Integument: Skin is warm, dry and supple bilateral. Nails are elongated and thickened, consistent with onychomycosis, 1-5 bilateral. The previous ulceration at the right plantar hallux remains healed with no surrounding acute signs of infection. There is no fluctuance or no acute issues, to this area.  Healed ulceration sub-met 2, Right foot with no underlying opening, no surrounding signs of infection. No active drainage, no erythema, no malodor.  To right lower shin. There is ecchymosis from bruising from blood thinners. With no signs of infection.  Vasculature:  Dorsalis Pedis pulse 1/4 bilateral. Posterior Tibial pulse  1/4 bilateral. Capillary fill time <3 sec 1-5 bilateral. Scant hair growth to the level of the digits.Temperature gradient within normal limits. + varicosities present bilateral. 1+ pitting edema present bilateral.    Neurology: The patient has intact sensation measured with a 5.07/10g Semmes Weinstein Monofilament at all pedal sites bilateral. Vibratory sensation diminished bilateral with tuning fork. No Babinski sign present bilateral.   Musculoskeletal: No tenderness bilateral. Asymptomatic bunion and hammertoes noted bilateral. Muscular strength within normal limits. No tenderness with calf compression bilateral.  Assessment and Plan: Problem List Items Addressed This Visit    None    Visit Diagnoses    Pain due to onychomycosis of toenails of both feet    -  Primary   Toe ulcer, right, with necrosis of bone (HCC)       Healed   Ecchymosis       Bilateral lower extremity edema       PVD (peripheral vascular disease) (HCC)       Relevant Medications   warfarin (COUMADIN) 5 MG tablet   Deep vein thrombosis (DVT) of left lower extremity, unspecified chronicity, unspecified vein (HCC)       Relevant Medications   warfarin (COUMADIN) 5 MG tablet   Diabetic polyneuropathy associated with type 2 diabetes  mellitus ([place])         -Examined patient. -Previous ulcerations remains healed. -Mechanically debrided nails 10 using sterile nail nipper without incident -Encouraged patient to continue with close PCP follow-up and management of edema and DVT and blood thinners -Advised patient to continue with good supportive shoes -Patient to return in  6  Weeks for diabetic foot care/nail trim or sooner if condition worsens.   Landis Martins, DPM

## 2017-06-03 DIAGNOSIS — E039 Hypothyroidism, unspecified: Secondary | ICD-10-CM | POA: Diagnosis not present

## 2017-06-03 DIAGNOSIS — E875 Hyperkalemia: Secondary | ICD-10-CM | POA: Diagnosis not present

## 2017-06-03 DIAGNOSIS — M069 Rheumatoid arthritis, unspecified: Secondary | ICD-10-CM | POA: Diagnosis not present

## 2017-06-03 DIAGNOSIS — I10 Essential (primary) hypertension: Secondary | ICD-10-CM

## 2017-06-03 DIAGNOSIS — K922 Gastrointestinal hemorrhage, unspecified: Secondary | ICD-10-CM

## 2017-06-03 DIAGNOSIS — E119 Type 2 diabetes mellitus without complications: Secondary | ICD-10-CM | POA: Diagnosis not present

## 2017-06-03 DIAGNOSIS — D62 Acute posthemorrhagic anemia: Secondary | ICD-10-CM

## 2017-06-04 DIAGNOSIS — K922 Gastrointestinal hemorrhage, unspecified: Secondary | ICD-10-CM | POA: Diagnosis not present

## 2017-06-04 DIAGNOSIS — D62 Acute posthemorrhagic anemia: Secondary | ICD-10-CM | POA: Diagnosis not present

## 2017-06-04 DIAGNOSIS — E039 Hypothyroidism, unspecified: Secondary | ICD-10-CM | POA: Diagnosis not present

## 2017-06-04 DIAGNOSIS — I10 Essential (primary) hypertension: Secondary | ICD-10-CM | POA: Diagnosis not present

## 2017-06-04 DIAGNOSIS — E875 Hyperkalemia: Secondary | ICD-10-CM | POA: Diagnosis not present

## 2017-06-04 DIAGNOSIS — M069 Rheumatoid arthritis, unspecified: Secondary | ICD-10-CM | POA: Diagnosis not present

## 2017-06-04 DIAGNOSIS — E119 Type 2 diabetes mellitus without complications: Secondary | ICD-10-CM | POA: Diagnosis not present

## 2017-06-05 DIAGNOSIS — E039 Hypothyroidism, unspecified: Secondary | ICD-10-CM | POA: Diagnosis not present

## 2017-06-05 DIAGNOSIS — I10 Essential (primary) hypertension: Secondary | ICD-10-CM | POA: Diagnosis not present

## 2017-06-05 DIAGNOSIS — E119 Type 2 diabetes mellitus without complications: Secondary | ICD-10-CM | POA: Diagnosis not present

## 2017-06-05 DIAGNOSIS — E875 Hyperkalemia: Secondary | ICD-10-CM | POA: Diagnosis not present

## 2017-06-05 DIAGNOSIS — D62 Acute posthemorrhagic anemia: Secondary | ICD-10-CM | POA: Diagnosis not present

## 2017-06-05 DIAGNOSIS — K922 Gastrointestinal hemorrhage, unspecified: Secondary | ICD-10-CM | POA: Diagnosis not present

## 2017-06-05 DIAGNOSIS — M069 Rheumatoid arthritis, unspecified: Secondary | ICD-10-CM | POA: Diagnosis not present

## 2017-06-06 DIAGNOSIS — E119 Type 2 diabetes mellitus without complications: Secondary | ICD-10-CM | POA: Diagnosis not present

## 2017-06-06 DIAGNOSIS — E039 Hypothyroidism, unspecified: Secondary | ICD-10-CM | POA: Diagnosis not present

## 2017-06-06 DIAGNOSIS — K922 Gastrointestinal hemorrhage, unspecified: Secondary | ICD-10-CM | POA: Diagnosis not present

## 2017-06-06 DIAGNOSIS — I10 Essential (primary) hypertension: Secondary | ICD-10-CM | POA: Diagnosis not present

## 2017-06-06 DIAGNOSIS — D62 Acute posthemorrhagic anemia: Secondary | ICD-10-CM | POA: Diagnosis not present

## 2017-06-06 DIAGNOSIS — M069 Rheumatoid arthritis, unspecified: Secondary | ICD-10-CM | POA: Diagnosis not present

## 2017-06-06 DIAGNOSIS — E875 Hyperkalemia: Secondary | ICD-10-CM | POA: Diagnosis not present

## 2017-07-03 ENCOUNTER — Ambulatory Visit: Payer: Medicare Other | Admitting: Sports Medicine

## 2017-07-17 ENCOUNTER — Ambulatory Visit: Payer: Medicare Other | Admitting: Sports Medicine

## 2017-07-31 ENCOUNTER — Ambulatory Visit (INDEPENDENT_AMBULATORY_CARE_PROVIDER_SITE_OTHER): Payer: Medicare Other | Admitting: Sports Medicine

## 2017-07-31 DIAGNOSIS — L03032 Cellulitis of left toe: Secondary | ICD-10-CM | POA: Diagnosis not present

## 2017-07-31 DIAGNOSIS — I739 Peripheral vascular disease, unspecified: Secondary | ICD-10-CM | POA: Diagnosis not present

## 2017-07-31 DIAGNOSIS — S90421A Blister (nonthermal), right great toe, initial encounter: Secondary | ICD-10-CM

## 2017-07-31 DIAGNOSIS — E1142 Type 2 diabetes mellitus with diabetic polyneuropathy: Secondary | ICD-10-CM

## 2017-07-31 NOTE — Progress Notes (Signed)
Patient ID: Barbara Ahart, female   DOB: [birth year], 81 y.o.   MRN: [id number]  Subjective: Chakita Mcgraw is a 81 y.o. female patient with history of type 2 diabetes who returns to office today for new concerns right 1st toe blister and left 1st toe nail drainage with redness noticed this week. Patient is assisted by son this visit. Patient denies any other complaints at this time.   Patient Active Problem List   Diagnosis Date Noted  . Encounter for chest tube removal   . Emesis   . DM type 2 (diabetes mellitus, type 2) ([place]) 01/28/2016  . Pressure ulcer 01/23/2016  . Pulmonary embolism with acute cor pulmonale ([place])   . Pleural effusion on right   . Shortness of breath   . Hypoxemia   . Pulmonary embolism ([place]) 01/22/2016  . Pleural effusion   . Pericardial effusion   . Rheumatoid arthritis with positive rheumatoid factor (HCC)   . Mycotic toenails 07/23/2013   Current Outpatient Prescriptions on File Prior to Visit  Medication Sig Dispense Refill  . ALPRAZolam (XANAX) 0.25 MG tablet Take 1 tablet (0.25 mg total) by mouth 2 (two) times daily as needed for anxiety. 30 tablet 0  . aspirin EC 81 MG tablet Take 81 mg by mouth daily. Reported on 03/06/2016    . cadexomer iodine (IODOSORB) 0.9 % gel Apply 1 application topically daily as needed for wound care. Right foot bottom at open area 40 g 1  . clindamycin (CLEOCIN) 300 MG capsule Take 1 capsule (300 mg total) by mouth 3 (three) times daily. 30 capsule 0  . furosemide (LASIX) 20 MG tablet Take 10 mg by mouth daily as needed.   0  . glimepiride (AMARYL) 1 MG tablet Take 1 mg by mouth 2 (two) times daily.  0  . HYDROcodone-acetaminophen (NORCO) 10-325 MG tablet Take 1 tablet by mouth every 8 (eight) hours as needed (pain). 15 tablet 0  . Insulin Glargine (LANTUS SOLOSTAR) 100 UNIT/ML Solostar Pen Inject 10 Units into the skin at bedtime. 15 mL 11  . levalbuterol (XOPENEX) 0.63 MG/3ML nebulizer solution Take 3 mLs (0.63 mg  total) by nebulization every 6 (six) hours as needed for wheezing or shortness of breath. (Patient not taking: Reported on 03/06/2016) 500 mL 1  . levothyroxine (SYNTHROID, LEVOTHROID) 100 MCG tablet Take 100 mcg by mouth daily.  0  . Liniments (BLUE-EMU SUPER STRENGTH EX) Apply 1 application topically daily as needed (neck pain).    . metoprolol (LOPRESSOR) 25 MG tablet Take 1 tablet (25 mg total) by mouth 2 (two) times daily. 60 tablet 0  . omeprazole (PRILOSEC) 20 MG capsule Take 20 mg by mouth daily.    . pantoprazole (PROTONIX) 40 MG tablet Take 1 tablet (40 mg total) by mouth daily. (Patient not taking: Reported on 03/06/2016) 30 tablet 0  . predniSONE (DELTASONE) 10 MG tablet Take 1 tablet (10 mg total) by mouth daily with breakfast. 30 tablet 0  . promethazine (PHENERGAN) 25 MG tablet Take 1 tablet (25 mg total) by mouth every 8 (eight) hours as needed for nausea or vomiting. 20 tablet 0  . silver sulfADIAZINE (SILVADENE) 1 % cream Apply 1 application topically daily. To right foot ulcer 50 g 0  . simvastatin (ZOCOR) 20 MG tablet Take 20 mg by mouth daily.    Marland Kitchen warfarin (COUMADIN) 5 MG tablet take 1 tablet by mouth once daily AT 8PM  0  . zolpidem (AMBIEN) 5 MG tablet Take 1  tablet (5 mg total) by mouth at bedtime as needed for sleep. (Patient not taking: Reported on 03/06/2016) 30 tablet 0   Current Facility-Administered Medications on File Prior to Visit  Medication Dose Route Frequency Provider Last Rate Last Dose  . triamcinolone acetonide (KENALOG) 10 MG/ML injection 10 mg  10 mg Other Once Landis Martins, DPM       Allergies  Allergen Reactions  . Doxycycline     Pancreatitis with nausea  . Cephalexin Other (See Comments)    Pt states since she had meningitis, she not able to take antibiotic, but did not give reactions.   . Codeine Nausea And Vomiting    Objective: General: Patient is awake, alert, and oriented x 3 and in no acute distress.  Integument: Skin is warm, dry and  supple bilateral. Nails are short and thickened, consistent with onychomycosis, 1-5 bilateral. At the left great toe there is distal lysis with red nail margin at the medial aspect and focal swelling with clear drainage. There is pain with touch to this area. To right hallux plantar aspect there is a blister that appears to be ruptured with no signs of infection.   Healed ulceration sub-met 2, Right foot with no underlying opening, no surrounding signs of infection. No active drainage, no erythema, no malodor.  Vasculature:  Dorsalis Pedis pulse 1/4 bilateral. Posterior Tibial pulse  1/4 bilateral. Capillary fill time <3 sec 1-5 bilateral. Scant hair growth to the level of the digits.Temperature gradient within normal limits. + varicosities present bilateral. 1+ pitting edema present bilateral.    Neurology: The patient has intact sensation measured with a 5.07/10g Semmes Weinstein Monofilament at all pedal sites bilateral. Vibratory sensation diminished bilateral with tuning fork. No Babinski sign present bilateral.   Musculoskeletal: + Tenderness to left hallux nail and blister site on right. Asymptomatic bunion and hammertoes noted bilateral. Muscular strength within normal limits. No tenderness with calf compression bilateral.  Assessment and Plan: Problem List Items Addressed This Visit    None    Visit Diagnoses    Blister of great toe of right foot, initial encounter    -  Primary   Paronychia of great toe, left       PVD (peripheral vascular disease) ([place])       Diabetic polyneuropathy associated with type 2 diabetes mellitus ([place])         -Examined patient. Discussed treatment alternatives and plan of care for paronychia - After a verbal consent, injected 3 ml of a 50:50 mixture of 2% plain  lidocaine and 0.5% plain marcaine in a normal hallux block fashion. Next, a  betadine prep was performed. Anesthesia was tested and found to be appropriate.  The left hallux nail was then  incised from the hyponychium to the epinychium and removed and cleared from the field. The area was curretted for any remaining nail or spicule and flushed with alcohol and dressed with antibiotic cream and a dry sterile dressing. -Patient was instructed to leave the dressing intact for today and begin soaking  in a weak solution of betadine and water tomorrow. Patient was instructed to  soak for 15 minutes each day and apply neosporin and a gauze or bandaid dressing each day. -Patient was instructed to monitor the toe for signs of infection and return to office if toe becomes red, hot or swollen. -Use post op shoe on left -Applied neosporin to blister on right great toe and advised to do the same at home daily  -Patient  to return in 2  Weeks for nail check on left and blister check on right or sooner if condition worsens.   Landis Martins, DPM

## 2017-07-31 NOTE — Patient Instructions (Addendum)

## 2017-08-13 ENCOUNTER — Ambulatory Visit (INDEPENDENT_AMBULATORY_CARE_PROVIDER_SITE_OTHER): Payer: Medicare Other | Admitting: Sports Medicine

## 2017-08-13 DIAGNOSIS — L97511 Non-pressure chronic ulcer of other part of right foot limited to breakdown of skin: Secondary | ICD-10-CM

## 2017-08-13 DIAGNOSIS — S90829A Blister (nonthermal), unspecified foot, initial encounter: Secondary | ICD-10-CM | POA: Diagnosis not present

## 2017-08-13 DIAGNOSIS — I739 Peripheral vascular disease, unspecified: Secondary | ICD-10-CM

## 2017-08-13 DIAGNOSIS — Z9889 Other specified postprocedural states: Secondary | ICD-10-CM

## 2017-08-13 DIAGNOSIS — E1142 Type 2 diabetes mellitus with diabetic polyneuropathy: Secondary | ICD-10-CM

## 2017-08-13 NOTE — Progress Notes (Signed)
Patient ID: Kirsten Gallegos, female   DOB: 07-26-1936, 81 y.o.   MRN: [id number]  Subjective: Kirsten Gallegos is a 81 y.o. female patient with history of type 2 diabetes who returns to office today for for follow up left 1st toe check after removed for paronychia on 07-31-17. Patient reports increased pain to right heel and continued drainage from 1st toes. Patient reports that she hasn't been soaking as instructed. Patient is assisted by son this visit. Patient denies any other complaints at this time.   Patient Active Problem List   Diagnosis Date Noted  . Encounter for chest tube removal   . Emesis   . DM type 2 (diabetes mellitus, type 2) ([place]) 01/28/2016  . Pressure ulcer 01/23/2016  . Pulmonary embolism with acute cor pulmonale ([place])   . Pleural effusion on right   . Shortness of breath   . Hypoxemia   . Pulmonary embolism ([place]) 01/22/2016  . Pleural effusion   . Pericardial effusion   . Rheumatoid arthritis with positive rheumatoid factor (HCC)   . Mycotic toenails 07/23/2013   Current Outpatient Prescriptions on File Prior to Visit  Medication Sig Dispense Refill  . ALPRAZolam (XANAX) 0.25 MG tablet Take 1 tablet (0.25 mg total) by mouth 2 (two) times daily as needed for anxiety. 30 tablet 0  . aspirin EC 81 MG tablet Take 81 mg by mouth daily. Reported on 03/06/2016    . cadexomer iodine (IODOSORB) 0.9 % gel Apply 1 application topically daily as needed for wound care. Right foot bottom at open area 40 g 1  . clindamycin (CLEOCIN) 300 MG capsule Take 1 capsule (300 mg total) by mouth 3 (three) times daily. 30 capsule 0  . furosemide (LASIX) 20 MG tablet Take 10 mg by mouth daily as needed.   0  . glimepiride (AMARYL) 1 MG tablet Take 1 mg by mouth 2 (two) times daily.  0  . HYDROcodone-acetaminophen (NORCO) 10-325 MG tablet Take 1 tablet by mouth every 8 (eight) hours as needed (pain). 15 tablet 0  . Insulin Glargine (LANTUS SOLOSTAR) 100 UNIT/ML Solostar Pen Inject 10 Units  into the skin at bedtime. 15 mL 11  . levalbuterol (XOPENEX) 0.63 MG/3ML nebulizer solution Take 3 mLs (0.63 mg total) by nebulization every 6 (six) hours as needed for wheezing or shortness of breath. (Patient not taking: Reported on 03/06/2016) 500 mL 1  . levothyroxine (SYNTHROID, LEVOTHROID) 100 MCG tablet Take 100 mcg by mouth daily.  0  . Liniments (BLUE-EMU SUPER STRENGTH EX) Apply 1 application topically daily as needed (neck pain).    . metoprolol (LOPRESSOR) 25 MG tablet Take 1 tablet (25 mg total) by mouth 2 (two) times daily. 60 tablet 0  . omeprazole (PRILOSEC) 20 MG capsule Take 20 mg by mouth daily.    . pantoprazole (PROTONIX) 40 MG tablet Take 1 tablet (40 mg total) by mouth daily. (Patient not taking: Reported on 03/06/2016) 30 tablet 0  . predniSONE (DELTASONE) 10 MG tablet Take 1 tablet (10 mg total) by mouth daily with breakfast. 30 tablet 0  . promethazine (PHENERGAN) 25 MG tablet Take 1 tablet (25 mg total) by mouth every 8 (eight) hours as needed for nausea or vomiting. 20 tablet 0  . silver sulfADIAZINE (SILVADENE) 1 % cream Apply 1 application topically daily. To right foot ulcer 50 g 0  . simvastatin (ZOCOR) 20 MG tablet Take 20 mg by mouth daily.    Marland Kitchen warfarin (COUMADIN) 5 MG tablet take 1  tablet by mouth once daily AT 8PM  0  . zolpidem (AMBIEN) 5 MG tablet Take 1 tablet (5 mg total) by mouth at bedtime as needed for sleep. (Patient not taking: Reported on 03/06/2016) 30 tablet 0   Current Facility-Administered Medications on File Prior to Visit  Medication Dose Route Frequency Provider Last Rate Last Dose  . triamcinolone acetonide (KENALOG) 10 MG/ML injection 10 mg  10 mg Other Once Landis Martins, DPM       Allergies  Allergen Reactions  . Doxycycline     Pancreatitis with nausea  . Cephalexin Other (See Comments)    Pt states since she had meningitis, she not able to take antibiotic, but did not give reactions.   . Codeine Nausea And Vomiting     Objective: General: Patient is awake, alert, and oriented x 3 and in no acute distress.  Integument: Skin is warm, dry and supple bilateral. Nails are short and thickened, consistent with onychomycosis, 1-5 bilateral. At the left 1st toe nail bed has fibrotic skin with decreased erythema, edema, and clear drainage without odor. To right hallux plantar at area of previous blister there is a partial thickness ulceration that measures 1 x 1 cm with a fibro-granular base that does not probe to bone. There is no significant malodor, no active drainage, no other acute signs of infection. To Right posterior heel there is a clear fluid blister with no surrounding signs of infection.  Healed ulceration sub-met 2, Right foot with no underlying opening, no surrounding signs of infection. No active drainage, no erythema, no malodor.  Vasculature:  Dorsalis Pedis pulse 1/4 bilateral. Posterior Tibial pulse  1/4 bilateral. Capillary fill time <3 sec 1-5 bilateral. Scant hair growth to the level of the digits.Temperature gradient within normal limits. + varicosities present bilateral. 1+ pitting edema present bilateral.    Neurology: The patient has intact sensation measured with a 5.07/10g Semmes Weinstein Monofilament at all pedal sites bilateral. Vibratory sensation diminished bilateral with tuning fork. No Babinski sign present bilateral.   Musculoskeletal: + Tenderness to left hallux nailbed and blister site on right. Asymptomatic bunion and hammertoes noted bilateral. Muscular strength within normal limits. No tenderness with calf compression bilateral.  Assessment and Plan: Problem List Items Addressed This Visit    None    Visit Diagnoses    Skin ulcer of right great toe, limited to breakdown of skin (HCC)    -  Primary   Blister of heel, initial encounter       S/P nail surgery       Left 1st toe secondary to paronychia   PVD (peripheral vascular disease) ([place])       Diabetic polyneuropathy  associated with type 2 diabetes mellitus ([place])         -Examined patient. -Discussed plan of care and treatment options for postop left first toe right heel blister and ulcer at right great toe -Recommend patient to continue with Epsom salt so and after soaking apply Betadine to left great toe, right great toe and to posterior heel at today's visit, I lanced the fluid blister using a sterile blade at the right posterior heel and redressed. All areas with Betadine and dry gauze -Gave replacement Postoperative shoe to wear on right to prevent rubbing at back of heel and advised elevation when sitting to assist with edema control in making sure she is offloading the heels when in bed or at rest -Recommend close monitoring of all sites areas worsen to return to  office sooner -Patient to return in 2  Weeks for nail check on left and ulcer and blister check on right or sooner if condition worsens.   Landis Martins, DPM

## 2017-08-29 ENCOUNTER — Ambulatory Visit: Payer: Medicare Other | Admitting: Sports Medicine

## 2017-09-10 ENCOUNTER — Encounter: Payer: Self-pay | Admitting: Sports Medicine

## 2017-09-10 ENCOUNTER — Ambulatory Visit (INDEPENDENT_AMBULATORY_CARE_PROVIDER_SITE_OTHER): Payer: Medicare Other | Admitting: Sports Medicine

## 2017-09-10 DIAGNOSIS — M86171 Other acute osteomyelitis, right ankle and foot: Secondary | ICD-10-CM | POA: Diagnosis not present

## 2017-09-10 DIAGNOSIS — E1142 Type 2 diabetes mellitus with diabetic polyneuropathy: Secondary | ICD-10-CM

## 2017-09-10 DIAGNOSIS — L97514 Non-pressure chronic ulcer of other part of right foot with necrosis of bone: Secondary | ICD-10-CM

## 2017-09-10 DIAGNOSIS — L02619 Cutaneous abscess of unspecified foot: Secondary | ICD-10-CM

## 2017-09-10 DIAGNOSIS — L03039 Cellulitis of unspecified toe: Secondary | ICD-10-CM

## 2017-09-10 DIAGNOSIS — I739 Peripheral vascular disease, unspecified: Secondary | ICD-10-CM

## 2017-09-10 DIAGNOSIS — S90821D Blister (nonthermal), right foot, subsequent encounter: Secondary | ICD-10-CM

## 2017-09-10 DIAGNOSIS — Z9889 Other specified postprocedural states: Secondary | ICD-10-CM

## 2017-09-10 MED ORDER — CLINDAMYCIN HCL 300 MG PO CAPS: 300.0000 mg | ORAL_CAPSULE | Freq: Three times a day (TID) | ORAL | 0 refills | Status: AC

## 2017-09-10 NOTE — Progress Notes (Signed)
Patient ID: Kirsten Gallegos, female   DOB: November 17, 1935, 81 y.o.   MRN: [id number]  Subjective: Kirsten Gallegos is a 81 y.o. female patient with history of type 2 diabetes who returns to office today for for follow up left 1st toe check after removed for paronychia on 07-31-17. Patient is also here for follow up wound care on right. Reports that right 1st toe wound has been draining, red, and swollen, states been soaking with epsom salt and dressing with antibiotic cream. Reports that heel on right is peeling. Patient is assisted by son this visit who has been helping with wound care. Patient denies any other complaints at this time.   Patient Active Problem List   Diagnosis Date Noted  . Encounter for chest tube removal   . Emesis   . DM type 2 (diabetes mellitus, type 2) ([place]) 01/28/2016  . Pressure ulcer 01/23/2016  . Pulmonary embolism with acute cor pulmonale ([place])   . Pleural effusion on right   . Shortness of breath   . Hypoxemia   . Pulmonary embolism ([place]) 01/22/2016  . Pleural effusion   . Pericardial effusion   . Rheumatoid arthritis with positive rheumatoid factor (HCC)   . Mycotic toenails 07/23/2013   Current Outpatient Medications on File Prior to Visit  Medication Sig Dispense Refill  . ALPRAZolam (XANAX) 0.25 MG tablet Take 1 tablet (0.25 mg total) by mouth 2 (two) times daily as needed for anxiety. 30 tablet 0  . aspirin EC 81 MG tablet Take 81 mg by mouth daily. Reported on 03/06/2016    . cadexomer iodine (IODOSORB) 0.9 % gel Apply 1 application topically daily as needed for wound care. Right foot bottom at open area 40 g 1  . furosemide (LASIX) 20 MG tablet Take 10 mg by mouth daily as needed.   0  . glimepiride (AMARYL) 1 MG tablet Take 1 mg by mouth 2 (two) times daily.  0  . HYDROcodone-acetaminophen (NORCO) 10-325 MG tablet Take 1 tablet by mouth every 8 (eight) hours as needed (pain). 15 tablet 0  . Insulin Glargine (LANTUS SOLOSTAR) 100 UNIT/ML Solostar Pen  Inject 10 Units into the skin at bedtime. 15 mL 11  . levalbuterol (XOPENEX) 0.63 MG/3ML nebulizer solution Take 3 mLs (0.63 mg total) by nebulization every 6 (six) hours as needed for wheezing or shortness of breath. (Patient not taking: Reported on 03/06/2016) 500 mL 1  . levothyroxine (SYNTHROID, LEVOTHROID) 100 MCG tablet Take 100 mcg by mouth daily.  0  . Liniments (BLUE-EMU SUPER STRENGTH EX) Apply 1 application topically daily as needed (neck pain).    . metoprolol (LOPRESSOR) 25 MG tablet Take 1 tablet (25 mg total) by mouth 2 (two) times daily. 60 tablet 0  . omeprazole (PRILOSEC) 20 MG capsule Take 20 mg by mouth daily.    . pantoprazole (PROTONIX) 40 MG tablet Take 1 tablet (40 mg total) by mouth daily. (Patient not taking: Reported on 03/06/2016) 30 tablet 0  . predniSONE (DELTASONE) 10 MG tablet Take 1 tablet (10 mg total) by mouth daily with breakfast. 30 tablet 0  . promethazine (PHENERGAN) 25 MG tablet Take 1 tablet (25 mg total) by mouth every 8 (eight) hours as needed for nausea or vomiting. 20 tablet 0  . silver sulfADIAZINE (SILVADENE) 1 % cream Apply 1 application topically daily. To right foot ulcer 50 g 0  . simvastatin (ZOCOR) 20 MG tablet Take 20 mg by mouth daily.    Marland Kitchen warfarin (COUMADIN) 5  MG tablet take 1 tablet by mouth once daily AT 8PM  0  . zolpidem (AMBIEN) 5 MG tablet Take 1 tablet (5 mg total) by mouth at bedtime as needed for sleep. (Patient not taking: Reported on 03/06/2016) 30 tablet 0   Current Facility-Administered Medications on File Prior to Visit  Medication Dose Route Frequency Provider Last Rate Last Dose  . triamcinolone acetonide (KENALOG) 10 MG/ML injection 10 mg  10 mg Other Once Landis Martins, DPM       Allergies  Allergen Reactions  . Doxycycline     Pancreatitis with nausea  . Cephalexin Other (See Comments)    Pt states since she had meningitis, she not able to take antibiotic, but did not give reactions.   . Codeine Nausea And Vomiting     Objective: General: Patient is awake, alert, and oriented x 3 and in no acute distress.  Integument: Skin is warm, dry and supple bilateral. Nails are short and thickened, consistent with onychomycosis, 1-5 bilateral. At the left 1st toe nail bed has fibrotic skin with decreased erythema, edema, and clear drainage without odor that is improving and healing up well.  To right hallux plantar aspect at area of previous partial thickness ulceration there is a full thickness ulceration now with bone and tendon exposure measures 2.5 x 1.5 cm with fatty tissue surrounding the wound margins. There is no significant malodor, + blood active drainage, + focal erythema and edema, no other acute signs of infection.   To Right posterior heel there is a healed blister with peeling skin.   Healed ulceration sub-met 2, Right foot with no underlying opening, no surrounding signs of infection. No active drainage, no erythema, no malodor.  Vasculature:  Dorsalis Pedis pulse 1/4 bilateral. Posterior Tibial pulse  1/4 bilateral. Capillary fill time <3 sec 1-5 bilateral. Scant hair growth to the level of the digits.Temperature gradient within normal limits. + varicosities present bilateral. 1+ pitting edema present bilateral.    Neurology: The patient has intact sensation measured with a 5.07/10g Semmes Weinstein Monofilament at all pedal sites bilateral. Vibratory sensation diminished bilateral with tuning fork. No Babinski sign present bilateral.   Musculoskeletal: + Tenderness to right 1st toe ulcer. Asymptomatic bunion and hammertoes noted bilateral. Muscular strength within normal limits. No tenderness with calf compression bilateral.  Assessment and Plan: Problem List Items Addressed This Visit    None    Visit Diagnoses    Acute osteomyelitis of right ankle or foot ([place])    -  Primary   Relevant Medications   clindamycin (CLEOCIN) 300 MG capsule   Other Relevant Orders   Biopsy bone   Toe ulcer,  right, with necrosis of bone (HCC)       Relevant Orders   Biopsy bone   Cellulitis and abscess of toe, unspecified laterality       Relevant Medications   clindamycin (CLEOCIN) 300 MG capsule   Other Relevant Orders   Biopsy bone   Blister of right heel, subsequent encounter       resolved   S/P nail surgery       PVD (peripheral vascular disease) (HCC)       Diabetic polyneuropathy associated with type 2 diabetes mellitus ([place])         -Examined patient. -Discussed plan of care and treatment options -Patient refused to go to hospital for admission for right 1st toe osteomyelitis and debridement. Advised patient that she is at risk of amputation. Patient still refused to go  to hospital.  -After aseptic prep using a tissue nipper exposed bone was taken from the wound bed in a biospy fashion and sent to pathology and started patient on Clindamycin, + past history of MSSA and osteomyelitis back in March and was on PICC line antibiotics which were d/c after 3 weeks when STEMI was idenitified. After bone biopsy was taken cleansed area and applied surgicel to stop bleeding and advised patient to do the same. Once bleeding has resolved by start using iodosorb to area daily. -Recommend antibacterial cleanse for left 1st toe and to continue with antibiotic cream and bandaid -Advised patient to let her PCP check her feet on Friday and if no better to have PCP call me  -Recommend continue with post on shoe only on right -Advised elevation when sitting to assist with edema control in making sure she is offloading the heels when in bed or at rest -Recommend close monitoring of all sites areas worsen to return to office sooner or ER -Patient to return in 1 week for follow up care or sooner if condition worsens.   Landis Martins, DPM

## 2017-09-12 ENCOUNTER — Telehealth: Payer: Self-pay | Admitting: *Deleted

## 2017-09-12 ENCOUNTER — Telehealth: Payer: Self-pay | Admitting: Sports Medicine

## 2017-09-12 DIAGNOSIS — N179 Acute kidney failure, unspecified: Secondary | ICD-10-CM

## 2017-09-12 DIAGNOSIS — I10 Essential (primary) hypertension: Secondary | ICD-10-CM

## 2017-09-12 DIAGNOSIS — M069 Rheumatoid arthritis, unspecified: Secondary | ICD-10-CM | POA: Diagnosis not present

## 2017-09-12 DIAGNOSIS — M869 Osteomyelitis, unspecified: Secondary | ICD-10-CM | POA: Diagnosis not present

## 2017-09-12 DIAGNOSIS — E11621 Type 2 diabetes mellitus with foot ulcer: Secondary | ICD-10-CM

## 2017-09-12 DIAGNOSIS — M86671 Other chronic osteomyelitis, right ankle and foot: Secondary | ICD-10-CM

## 2017-09-12 DIAGNOSIS — E039 Hypothyroidism, unspecified: Secondary | ICD-10-CM | POA: Diagnosis not present

## 2017-09-12 NOTE — Telephone Encounter (Signed)
Tried to call patient to follow up after appointment Wednesday and phone call from PCP this morning to Dr Marylene Land.  Phone just rang will try again after lunch.

## 2017-09-12 NOTE — Telephone Encounter (Signed)
[facility] (Nurse Practitioner, Sherol Dade) called concerning pt. Her foot is not getting any better its actually getting worse with streaking up the leg almost to the knee. Please call her at [phone].

## 2017-09-12 NOTE — Telephone Encounter (Signed)
Spoke with Martie Lee nurse practitioner from [facility] she informed me that she encourage patient to go to the emergency room as well because the cellulitis had extended up her foot and leg and she had not started on the oral antibiotic that I have prescribed to her 2 days ago; I encouraged Martie Lee that the office will make a phone call to follow-up to check on the patient to make sure the patient went to the emergency room/hospital per our recommendations.  Martie Lee thank me for taking the time to discuss this patient with her. -Dr Marylene Land

## 2017-09-13 DIAGNOSIS — E039 Hypothyroidism, unspecified: Secondary | ICD-10-CM | POA: Diagnosis not present

## 2017-09-13 DIAGNOSIS — M869 Osteomyelitis, unspecified: Secondary | ICD-10-CM | POA: Diagnosis not present

## 2017-09-13 DIAGNOSIS — N179 Acute kidney failure, unspecified: Secondary | ICD-10-CM | POA: Diagnosis not present

## 2017-09-13 DIAGNOSIS — E11621 Type 2 diabetes mellitus with foot ulcer: Secondary | ICD-10-CM | POA: Diagnosis not present

## 2017-09-13 DIAGNOSIS — M86671 Other chronic osteomyelitis, right ankle and foot: Secondary | ICD-10-CM | POA: Diagnosis not present

## 2017-09-13 DIAGNOSIS — M069 Rheumatoid arthritis, unspecified: Secondary | ICD-10-CM | POA: Diagnosis not present

## 2017-09-13 DIAGNOSIS — I10 Essential (primary) hypertension: Secondary | ICD-10-CM | POA: Diagnosis not present

## 2017-09-14 DIAGNOSIS — N179 Acute kidney failure, unspecified: Secondary | ICD-10-CM | POA: Diagnosis not present

## 2017-09-14 DIAGNOSIS — E039 Hypothyroidism, unspecified: Secondary | ICD-10-CM | POA: Diagnosis not present

## 2017-09-14 DIAGNOSIS — M069 Rheumatoid arthritis, unspecified: Secondary | ICD-10-CM | POA: Diagnosis not present

## 2017-09-14 DIAGNOSIS — I10 Essential (primary) hypertension: Secondary | ICD-10-CM | POA: Diagnosis not present

## 2017-09-14 DIAGNOSIS — M869 Osteomyelitis, unspecified: Secondary | ICD-10-CM | POA: Diagnosis not present

## 2017-09-14 DIAGNOSIS — E11621 Type 2 diabetes mellitus with foot ulcer: Secondary | ICD-10-CM | POA: Diagnosis not present

## 2017-09-15 DIAGNOSIS — E039 Hypothyroidism, unspecified: Secondary | ICD-10-CM | POA: Diagnosis not present

## 2017-09-15 DIAGNOSIS — I10 Essential (primary) hypertension: Secondary | ICD-10-CM | POA: Diagnosis not present

## 2017-09-15 DIAGNOSIS — E11621 Type 2 diabetes mellitus with foot ulcer: Secondary | ICD-10-CM | POA: Diagnosis not present

## 2017-09-15 DIAGNOSIS — N179 Acute kidney failure, unspecified: Secondary | ICD-10-CM | POA: Diagnosis not present

## 2017-09-15 DIAGNOSIS — M869 Osteomyelitis, unspecified: Secondary | ICD-10-CM | POA: Diagnosis not present

## 2017-09-15 DIAGNOSIS — M069 Rheumatoid arthritis, unspecified: Secondary | ICD-10-CM | POA: Diagnosis not present

## 2017-09-16 NOTE — Telephone Encounter (Signed)
Received call after lunch 09/12/17 that patient was in the ER and being admitted Dr. Marylene Land to see patient after clinic.

## 2017-09-17 ENCOUNTER — Ambulatory Visit (INDEPENDENT_AMBULATORY_CARE_PROVIDER_SITE_OTHER): Payer: Medicare Other | Admitting: Sports Medicine

## 2017-09-17 ENCOUNTER — Encounter: Payer: Self-pay | Admitting: Sports Medicine

## 2017-09-17 DIAGNOSIS — I739 Peripheral vascular disease, unspecified: Secondary | ICD-10-CM

## 2017-09-17 DIAGNOSIS — Z89411 Acquired absence of right great toe: Secondary | ICD-10-CM

## 2017-09-17 DIAGNOSIS — Z9889 Other specified postprocedural states: Secondary | ICD-10-CM

## 2017-09-17 DIAGNOSIS — E1142 Type 2 diabetes mellitus with diabetic polyneuropathy: Secondary | ICD-10-CM

## 2017-09-17 NOTE — Progress Notes (Signed)
Subjective: Kirsten Gallegos is a 81 y.o. female patient seen today in office for POV #1  (DOS 09-13-17), S/P right hallux amputation secondary to osteomyelitis and worsening diabetic foot ulcer. Patient denies pain at surgical site currently however does state that sometimes she gets occasional shooting pain, denies calf pain, denies headache, chest pain, shortness of breath, nausea, vomiting, fever, or chills. Patient states that she is back on her blood thinners and is only taking Tylenol if needed for any discomfort. No other issues noted.   Patient Active Problem List   Diagnosis Date Noted  . Encounter for chest tube removal   . Emesis   . DM type 2 (diabetes mellitus, type 2) (HCC) 01/28/2016  . Pressure ulcer 01/23/2016  . Pulmonary embolism with acute cor pulmonale (HCC)   . Pleural effusion on right   . Shortness of breath   . Hypoxemia   . Pulmonary embolism (HCC) 01/22/2016  . Pleural effusion   . Pericardial effusion   . Rheumatoid arthritis with positive rheumatoid factor (HCC)   . Mycotic toenails 07/23/2013    Current Outpatient Medications on File Prior to Visit  Medication Sig Dispense Refill  . ALPRAZolam (XANAX) 0.25 MG tablet Take 1 tablet (0.25 mg total) by mouth 2 (two) times daily as needed for anxiety. 30 tablet 0  . aspirin EC 81 MG tablet Take 81 mg by mouth daily. Reported on 03/06/2016    . cadexomer iodine (IODOSORB) 0.9 % gel Apply 1 application topically daily as needed for wound care. Right foot bottom at open area 40 g 1  . clindamycin (CLEOCIN) 300 MG capsule Take 1 capsule (300 mg total) 3 (three) times daily by mouth. 30 capsule 0  . furosemide (LASIX) 20 MG tablet Take 10 mg by mouth daily as needed.   0  . glimepiride (AMARYL) 1 MG tablet Take 1 mg by mouth 2 (two) times daily.  0  . HYDROcodone-acetaminophen (NORCO) 10-325 MG tablet Take 1 tablet by mouth every 8 (eight) hours as needed (pain). 15 tablet 0  . Insulin Glargine (LANTUS SOLOSTAR) 100  UNIT/ML Solostar Pen Inject 10 Units into the skin at bedtime. 15 mL 11  . levalbuterol (XOPENEX) 0.63 MG/3ML nebulizer solution Take 3 mLs (0.63 mg total) by nebulization every 6 (six) hours as needed for wheezing or shortness of breath. (Patient not taking: Reported on 03/06/2016) 500 mL 1  . levothyroxine (SYNTHROID, LEVOTHROID) 100 MCG tablet Take 100 mcg by mouth daily.  0  . Liniments (BLUE-EMU SUPER STRENGTH EX) Apply 1 application topically daily as needed (neck pain).    . metoprolol (LOPRESSOR) 25 MG tablet Take 1 tablet (25 mg total) by mouth 2 (two) times daily. 60 tablet 0  . omeprazole (PRILOSEC) 20 MG capsule Take 20 mg by mouth daily.    . pantoprazole (PROTONIX) 40 MG tablet Take 1 tablet (40 mg total) by mouth daily. (Patient not taking: Reported on 03/06/2016) 30 tablet 0  . predniSONE (DELTASONE) 10 MG tablet Take 1 tablet (10 mg total) by mouth daily with breakfast. 30 tablet 0  . promethazine (PHENERGAN) 25 MG tablet Take 1 tablet (25 mg total) by mouth every 8 (eight) hours as needed for nausea or vomiting. 20 tablet 0  . silver sulfADIAZINE (SILVADENE) 1 % cream Apply 1 application topically daily. To right foot ulcer 50 g 0  . simvastatin (ZOCOR) 20 MG tablet Take 20 mg by mouth daily.    Marland Kitchen warfarin (COUMADIN) 5 MG tablet take 1 tablet  by mouth once daily AT 8PM  0  . zolpidem (AMBIEN) 5 MG tablet Take 1 tablet (5 mg total) by mouth at bedtime as needed for sleep. (Patient not taking: Reported on 03/06/2016) 30 tablet 0   Current Facility-Administered Medications on File Prior to Visit  Medication Dose Route Frequency Provider Last Rate Last Dose  . triamcinolone acetonide (KENALOG) 10 MG/ML injection 10 mg  10 mg Other Once Asencion Islam, DPM        Allergies  Allergen Reactions  . Doxycycline     Pancreatitis with nausea  . Cephalexin Other (See Comments)    Pt states since she had meningitis, she not able to take antibiotic, but did not give reactions.   . Codeine  Nausea And Vomiting    Objective: There were no vitals filed for this visit.  General: No acute distress, AAOx3   Right foot: Sutures intact with no gapping or dehiscence at surgical site, moderate swelling and ecchymosis to the incision line over the amputation site at the right foot, no warmth, scant bloody drainage, no signs of infection noted, Capillary fill time <5 seconds in all remaining digits, gross sensation present via light touch to right foot subjective shooting pains. No pain or crepitation with range of motion right foot.  No pain with calf compression.   Left foot: First toe nailbed appears to be healing well with no acute signs of infection status post total nail avulsion with no acute signs of infection.  Assessment and Plan:  Problem List Items Addressed This Visit    None    Visit Diagnoses    Status post amputation of great toe, right (HCC)    -  Primary   S/P nail surgery       PVD (peripheral vascular disease) (HCC)       Diabetic polyneuropathy associated with type 2 diabetes mellitus (HCC)           -Patient seen and evaluated -Apply Betadine and Band-Aid to left first toenail bed and advised patient to continue to do the same with assistance from son may leave open to air to also assist with the area of drying up and continuing to heal. -Applied dry sterile dressing to surgical site right foot secured with ACE wrap and stockinet  -Advised patient to make sure to keep dressings clean, dry, and intact to left/right surgical site, removing the ACE as needed  -Advised patient to continue with post-op shoe on right foot   -Advised patient to limit activity to necessity with use of wheelchair -Advised patient to elevate as necessary  -We will continue to monitor her circulation and if areas shows difficulty in healing will reconsult interventional radiology since patient had diminished ABIs 0.6 as performed in hospital -Will plan for x-rays and wound dressing  change at next office visit. In the meantime, patient to call office if any issues or problems arise.   Asencion Islam, DPM

## 2017-09-19 ENCOUNTER — Encounter: Payer: Self-pay | Admitting: Sports Medicine

## 2017-09-24 ENCOUNTER — Encounter: Payer: Self-pay | Admitting: Sports Medicine

## 2017-09-24 ENCOUNTER — Ambulatory Visit (INDEPENDENT_AMBULATORY_CARE_PROVIDER_SITE_OTHER): Payer: Medicare Other | Admitting: Sports Medicine

## 2017-09-24 ENCOUNTER — Ambulatory Visit (INDEPENDENT_AMBULATORY_CARE_PROVIDER_SITE_OTHER): Payer: Medicare Other

## 2017-09-24 DIAGNOSIS — M86171 Other acute osteomyelitis, right ankle and foot: Secondary | ICD-10-CM | POA: Diagnosis not present

## 2017-09-24 DIAGNOSIS — Z89411 Acquired absence of right great toe: Secondary | ICD-10-CM

## 2017-09-24 DIAGNOSIS — I739 Peripheral vascular disease, unspecified: Secondary | ICD-10-CM

## 2017-09-24 DIAGNOSIS — E1142 Type 2 diabetes mellitus with diabetic polyneuropathy: Secondary | ICD-10-CM

## 2017-09-24 DIAGNOSIS — T148XXA Other injury of unspecified body region, initial encounter: Secondary | ICD-10-CM

## 2017-09-24 NOTE — Progress Notes (Signed)
Subjective: Kirsten Gallegos is a 81 y.o. female patient seen today in office for POV #2  (DOS 09-13-17), S/P right hallux amputation secondary to osteomyelitis and worsening diabetic foot ulcer. Patient denies pain at surgical site currently however does state that sometimes she gets occasional shooting pain feeling like her toe is still there like previously noted, denies calf pain, denies headache, chest pain, shortness of breath, nausea, vomiting, fever, or chills. No other issues noted.   Patient Active Problem List   Diagnosis Date Noted  . Encounter for chest tube removal   . Emesis   . DM type 2 (diabetes mellitus, type 2) (HCC) 01/28/2016  . Pressure ulcer 01/23/2016  . Pulmonary embolism with acute cor pulmonale (HCC)   . Pleural effusion on right   . Shortness of breath   . Hypoxemia   . Pulmonary embolism (HCC) 01/22/2016  . Pleural effusion   . Pericardial effusion   . Rheumatoid arthritis with positive rheumatoid factor (HCC)   . Mycotic toenails 07/23/2013    Current Outpatient Medications on File Prior to Visit  Medication Sig Dispense Refill  . ALPRAZolam (XANAX) 0.25 MG tablet Take 1 tablet (0.25 mg total) by mouth 2 (two) times daily as needed for anxiety. 30 tablet 0  . aspirin EC 81 MG tablet Take 81 mg by mouth daily. Reported on 03/06/2016    . cadexomer iodine (IODOSORB) 0.9 % gel Apply 1 application topically daily as needed for wound care. Right foot bottom at open area 40 g 1  . clindamycin (CLEOCIN) 300 MG capsule Take 1 capsule (300 mg total) 3 (three) times daily by mouth. 30 capsule 0  . furosemide (LASIX) 20 MG tablet Take 10 mg by mouth daily as needed.   0  . glimepiride (AMARYL) 1 MG tablet Take 1 mg by mouth 2 (two) times daily.  0  . HYDROcodone-acetaminophen (NORCO) 10-325 MG tablet Take 1 tablet by mouth every 8 (eight) hours as needed (pain). 15 tablet 0  . Insulin Glargine (LANTUS SOLOSTAR) 100 UNIT/ML Solostar Pen Inject 10 Units into the skin at  bedtime. 15 mL 11  . levalbuterol (XOPENEX) 0.63 MG/3ML nebulizer solution Take 3 mLs (0.63 mg total) by nebulization every 6 (six) hours as needed for wheezing or shortness of breath. (Patient not taking: Reported on 03/06/2016) 500 mL 1  . levothyroxine (SYNTHROID, LEVOTHROID) 100 MCG tablet Take 100 mcg by mouth daily.  0  . Liniments (BLUE-EMU SUPER STRENGTH EX) Apply 1 application topically daily as needed (neck pain).    . metoprolol (LOPRESSOR) 25 MG tablet Take 1 tablet (25 mg total) by mouth 2 (two) times daily. 60 tablet 0  . omeprazole (PRILOSEC) 20 MG capsule Take 20 mg by mouth daily.    . pantoprazole (PROTONIX) 40 MG tablet Take 1 tablet (40 mg total) by mouth daily. (Patient not taking: Reported on 03/06/2016) 30 tablet 0  . predniSONE (DELTASONE) 10 MG tablet Take 1 tablet (10 mg total) by mouth daily with breakfast. 30 tablet 0  . promethazine (PHENERGAN) 25 MG tablet Take 1 tablet (25 mg total) by mouth every 8 (eight) hours as needed for nausea or vomiting. 20 tablet 0  . silver sulfADIAZINE (SILVADENE) 1 % cream Apply 1 application topically daily. To right foot ulcer 50 g 0  . simvastatin (ZOCOR) 20 MG tablet Take 20 mg by mouth daily.    Marland Kitchen warfarin (COUMADIN) 5 MG tablet take 1 tablet by mouth once daily AT 8PM  0  .  zolpidem (AMBIEN) 5 MG tablet Take 1 tablet (5 mg total) by mouth at bedtime as needed for sleep. (Patient not taking: Reported on 03/06/2016) 30 tablet 0   Current Facility-Administered Medications on File Prior to Visit  Medication Dose Route Frequency Provider Last Rate Last Dose  . triamcinolone acetonide (KENALOG) 10 MG/ML injection 10 mg  10 mg Other Once Asencion Islam, DPM        Allergies  Allergen Reactions  . Doxycycline     Pancreatitis with nausea  . Cephalexin Other (See Comments)    Pt states since she had meningitis, she not able to take antibiotic, but did not give reactions.   . Codeine Nausea And Vomiting    Objective: There were no  vitals filed for this visit.  General: No acute distress, AAOx3   Right foot: Sutures intact with no gapping or dehiscence at surgical site, moderate swelling and ecchymosis and hematoma to the incision line over the amputation site at the right foot, no warmth, scant bloody drainage, no signs of infection noted, Capillary fill time <5 seconds in all remaining digits, gross sensation present via light touch to right foot subjective shooting pains. No pain or crepitation with range of motion right foot.  No pain with calf compression.   Left foot: First toe nailbed appears to be healing well with no acute signs of infection status post total nail avulsion with no acute signs of infection.  Xrays right foot consistent with toe amputation status with soft tissue swelling, previous hardware intact, no other acute findings.   Assessment and Plan:  Problem List Items Addressed This Visit    None    Visit Diagnoses    Status post amputation of great toe, right (HCC)    -  Primary   Relevant Orders   DG Foot Complete Right   Hematoma       PVD (peripheral vascular disease) (HCC)       Diabetic polyneuropathy associated with type 2 diabetes mellitus (HCC)           -Patient seen and evaluated -Xrays reviewed  -Continue to apply Betadine and Band-Aid to left first toenail bed and advised to continue to do the same with assistance from son may leave open to air to also assist with the area of drying up and continuing to heal. -Applied surgicel and dry sterile dressing to surgical site right foot secured with ACE wrap and stockinet  -Advised patient to make sure to keep dressings clean, dry, and intact to right surgical site, removing the ACE as needed  -Advised patient to continue with post-op shoe on right foot   -Advised patient to limit activity to necessity with use of wheelchair -Advised patient to elevate as necessary  -We will continue to monitor her circulation and if areas shows  difficulty in healing will reconsult interventional radiology since patient had diminished ABIs 0.6 as performed in hospital -Will plan for wound dressing change at next office visit since patient refused home nursing. In the meantime, patient to call office if any issues or problems arise.   Asencion Islam, DPM

## 2017-10-01 ENCOUNTER — Encounter: Payer: Self-pay | Admitting: Sports Medicine

## 2017-10-01 ENCOUNTER — Ambulatory Visit (INDEPENDENT_AMBULATORY_CARE_PROVIDER_SITE_OTHER): Payer: Medicare Other | Admitting: Sports Medicine

## 2017-10-01 DIAGNOSIS — I739 Peripheral vascular disease, unspecified: Secondary | ICD-10-CM

## 2017-10-01 DIAGNOSIS — Z89411 Acquired absence of right great toe: Secondary | ICD-10-CM

## 2017-10-01 DIAGNOSIS — E1142 Type 2 diabetes mellitus with diabetic polyneuropathy: Secondary | ICD-10-CM

## 2017-10-01 DIAGNOSIS — Z9889 Other specified postprocedural states: Secondary | ICD-10-CM

## 2017-10-01 DIAGNOSIS — M86171 Other acute osteomyelitis, right ankle and foot: Secondary | ICD-10-CM

## 2017-10-01 DIAGNOSIS — T148XXA Other injury of unspecified body region, initial encounter: Secondary | ICD-10-CM

## 2017-10-01 NOTE — Progress Notes (Signed)
Subjective: Kirsten Gallegos is a 81 y.o. female patient seen today in office for POV #3  (DOS 09-13-17), S/P right hallux amputation secondary to osteomyelitis and worsening diabetic foot ulcer. Patient denies pain at surgical site currently however does state that sometimes she gets occasional shooting pain as previously noted, denies calf pain, denies headache, chest pain, shortness of breath, nausea, vomiting, fever, or chills. No other issues noted.   Patient reports that she has been staying off her foot and has been using wheelchair.  Patient Active Problem List   Diagnosis Date Noted  . Encounter for chest tube removal   . Emesis   . DM type 2 (diabetes mellitus, type 2) (HCC) 01/28/2016  . Pressure ulcer 01/23/2016  . Pulmonary embolism with acute cor pulmonale (HCC)   . Pleural effusion on right   . Shortness of breath   . Hypoxemia   . Pulmonary embolism (HCC) 01/22/2016  . Pleural effusion   . Pericardial effusion   . Rheumatoid arthritis with positive rheumatoid factor (HCC)   . Mycotic toenails 07/23/2013    Current Outpatient Medications on File Prior to Visit  Medication Sig Dispense Refill  . ALPRAZolam (XANAX) 0.25 MG tablet Take 1 tablet (0.25 mg total) by mouth 2 (two) times daily as needed for anxiety. 30 tablet 0  . aspirin EC 81 MG tablet Take 81 mg by mouth daily. Reported on 03/06/2016    . cadexomer iodine (IODOSORB) 0.9 % gel Apply 1 application topically daily as needed for wound care. Right foot bottom at open area 40 g 1  . clindamycin (CLEOCIN) 300 MG capsule Take 1 capsule (300 mg total) 3 (three) times daily by mouth. 30 capsule 0  . furosemide (LASIX) 20 MG tablet Take 10 mg by mouth daily as needed.   0  . glimepiride (AMARYL) 1 MG tablet Take 1 mg by mouth 2 (two) times daily.  0  . HYDROcodone-acetaminophen (NORCO) 10-325 MG tablet Take 1 tablet by mouth every 8 (eight) hours as needed (pain). 15 tablet 0  . Insulin Glargine (LANTUS SOLOSTAR) 100  UNIT/ML Solostar Pen Inject 10 Units into the skin at bedtime. 15 mL 11  . levalbuterol (XOPENEX) 0.63 MG/3ML nebulizer solution Take 3 mLs (0.63 mg total) by nebulization every 6 (six) hours as needed for wheezing or shortness of breath. (Patient not taking: Reported on 03/06/2016) 500 mL 1  . levothyroxine (SYNTHROID, LEVOTHROID) 100 MCG tablet Take 100 mcg by mouth daily.  0  . Liniments (BLUE-EMU SUPER STRENGTH EX) Apply 1 application topically daily as needed (neck pain).    . metoprolol (LOPRESSOR) 25 MG tablet Take 1 tablet (25 mg total) by mouth 2 (two) times daily. 60 tablet 0  . omeprazole (PRILOSEC) 20 MG capsule Take 20 mg by mouth daily.    . pantoprazole (PROTONIX) 40 MG tablet Take 1 tablet (40 mg total) by mouth daily. (Patient not taking: Reported on 03/06/2016) 30 tablet 0  . predniSONE (DELTASONE) 10 MG tablet Take 1 tablet (10 mg total) by mouth daily with breakfast. 30 tablet 0  . promethazine (PHENERGAN) 25 MG tablet Take 1 tablet (25 mg total) by mouth every 8 (eight) hours as needed for nausea or vomiting. 20 tablet 0  . silver sulfADIAZINE (SILVADENE) 1 % cream Apply 1 application topically daily. To right foot ulcer 50 g 0  . simvastatin (ZOCOR) 20 MG tablet Take 20 mg by mouth daily.    Marland Kitchen warfarin (COUMADIN) 5 MG tablet take 1 tablet by  mouth once daily AT 8PM  0  . zolpidem (AMBIEN) 5 MG tablet Take 1 tablet (5 mg total) by mouth at bedtime as needed for sleep. (Patient not taking: Reported on 03/06/2016) 30 tablet 0   Current Facility-Administered Medications on File Prior to Visit  Medication Dose Route Frequency Provider Last Rate Last Dose  . triamcinolone acetonide (KENALOG) 10 MG/ML injection 10 mg  10 mg Other Once Asencion Islam, DPM        Allergies  Allergen Reactions  . Doxycycline     Pancreatitis with nausea  . Cephalexin Other (See Comments)    Pt states since she had meningitis, she not able to take antibiotic, but did not give reactions.   . Codeine  Nausea And Vomiting    Objective: There were no vitals filed for this visit.  General: No acute distress, AAOx3   Right foot: Sutures intact with no dehiscence at surgical site, moderate swelling and ecchymosis and hematoma to the incision line over the amputation site at the right foot that is improving, no warmth, scant bloody drainage, no signs of infection noted, Capillary fill time <5 seconds in all remaining digits, gross sensation present via light touch to right foot subjective shooting pains. No pain or crepitation with range of motion right foot.  No pain with calf compression.   Left foot: First toe nailbed appears to be healing well with no acute signs of infection status post total nail avulsion with no acute signs of infection.  Assessment and Plan:  Problem List Items Addressed This Visit    None    Visit Diagnoses    Status post amputation of great toe, right (HCC)    -  Primary   Hematoma       PVD (peripheral vascular disease) (HCC)       Diabetic polyneuropathy associated with type 2 diabetes mellitus (HCC)       S/P nail surgery       Acute osteomyelitis of right ankle or foot (HCC)           -Patient seen and evaluated -Continue to apply Betadine and leave open to air to also assist with the area of drying up and continuing to heal. -Applied Betadine and dry sterile dressing to surgical site right foot secured with ACE wrap and stockinet  -Advised patient to make sure to keep dressings clean, dry, and intact to right surgical site, removing the ACE as needed  -Advised patient to continue with post-op shoe on right foot   -Advised patient to limit activity to necessity with use of wheelchair -Advised patient to elevate as necessary  -We will continue to monitor her circulation and if areas shows difficulty in healing will reconsult interventional radiology since patient had diminished ABIs 0.6 as performed in hospital -Will plan for wound dressing change at next  office visit.  Patient still refuses home nursing or wound care consultation.  In the meantime, patient to call office if any issues or problems arise.   Asencion Islam, DPM

## 2017-10-09 ENCOUNTER — Ambulatory Visit (INDEPENDENT_AMBULATORY_CARE_PROVIDER_SITE_OTHER): Payer: Medicare Other | Admitting: Sports Medicine

## 2017-10-09 ENCOUNTER — Encounter: Payer: Self-pay | Admitting: Sports Medicine

## 2017-10-09 DIAGNOSIS — Z89411 Acquired absence of right great toe: Secondary | ICD-10-CM

## 2017-10-09 DIAGNOSIS — Z9889 Other specified postprocedural states: Secondary | ICD-10-CM

## 2017-10-09 DIAGNOSIS — T148XXA Other injury of unspecified body region, initial encounter: Secondary | ICD-10-CM

## 2017-10-09 DIAGNOSIS — I739 Peripheral vascular disease, unspecified: Secondary | ICD-10-CM

## 2017-10-09 DIAGNOSIS — E1142 Type 2 diabetes mellitus with diabetic polyneuropathy: Secondary | ICD-10-CM

## 2017-10-09 NOTE — Progress Notes (Signed)
Subjective: Kirsten Gallegos is a 81 y.o. female patient seen today in office for POV #4  (DOS 09-13-17), S/P right hallux amputation secondary to osteomyelitis and worsening diabetic foot ulcer. Patient denies pain at surgical site currently however does state that sometimes she gets occasional shooting pain as previously noted and pain in her right hip which her orthopedic doctor is treating, denies calf pain, denies headache, chest pain, shortness of breath, nausea, vomiting, fever, or chills. No other issues noted.   Patient reports that she has been staying off her foot and has been using wheelchair as previously noted.  Patient is assisted by son at this visit.  Patient Active Problem List   Diagnosis Date Noted  . Encounter for chest tube removal   . Emesis   . DM type 2 (diabetes mellitus, type 2) (HCC) 01/28/2016  . Pressure ulcer 01/23/2016  . Pulmonary embolism with acute cor pulmonale (HCC)   . Pleural effusion on right   . Shortness of breath   . Hypoxemia   . Pulmonary embolism (HCC) 01/22/2016  . Pleural effusion   . Pericardial effusion   . Rheumatoid arthritis with positive rheumatoid factor (HCC)   . Mycotic toenails 07/23/2013    Current Outpatient Medications on File Prior to Visit  Medication Sig Dispense Refill  . ALPRAZolam (XANAX) 0.25 MG tablet Take 1 tablet (0.25 mg total) by mouth 2 (two) times daily as needed for anxiety. 30 tablet 0  . aspirin EC 81 MG tablet Take 81 mg by mouth daily. Reported on 03/06/2016    . cadexomer iodine (IODOSORB) 0.9 % gel Apply 1 application topically daily as needed for wound care. Right foot bottom at open area 40 g 1  . clindamycin (CLEOCIN) 300 MG capsule Take 1 capsule (300 mg total) 3 (three) times daily by mouth. 30 capsule 0  . furosemide (LASIX) 20 MG tablet Take 10 mg by mouth daily as needed.   0  . glimepiride (AMARYL) 1 MG tablet Take 1 mg by mouth 2 (two) times daily.  0  . HYDROcodone-acetaminophen (NORCO) 10-325 MG  tablet Take 1 tablet by mouth every 8 (eight) hours as needed (pain). 15 tablet 0  . Insulin Glargine (LANTUS SOLOSTAR) 100 UNIT/ML Solostar Pen Inject 10 Units into the skin at bedtime. 15 mL 11  . levalbuterol (XOPENEX) 0.63 MG/3ML nebulizer solution Take 3 mLs (0.63 mg total) by nebulization every 6 (six) hours as needed for wheezing or shortness of breath. (Patient not taking: Reported on 03/06/2016) 500 mL 1  . levothyroxine (SYNTHROID, LEVOTHROID) 100 MCG tablet Take 100 mcg by mouth daily.  0  . Liniments (BLUE-EMU SUPER STRENGTH EX) Apply 1 application topically daily as needed (neck pain).    . metoprolol (LOPRESSOR) 25 MG tablet Take 1 tablet (25 mg total) by mouth 2 (two) times daily. 60 tablet 0  . omeprazole (PRILOSEC) 20 MG capsule Take 20 mg by mouth daily.    . pantoprazole (PROTONIX) 40 MG tablet Take 1 tablet (40 mg total) by mouth daily. (Patient not taking: Reported on 03/06/2016) 30 tablet 0  . predniSONE (DELTASONE) 10 MG tablet Take 1 tablet (10 mg total) by mouth daily with breakfast. 30 tablet 0  . promethazine (PHENERGAN) 25 MG tablet Take 1 tablet (25 mg total) by mouth every 8 (eight) hours as needed for nausea or vomiting. 20 tablet 0  . silver sulfADIAZINE (SILVADENE) 1 % cream Apply 1 application topically daily. To right foot ulcer 50 g 0  .  simvastatin (ZOCOR) 20 MG tablet Take 20 mg by mouth daily.    Marland Kitchen warfarin (COUMADIN) 5 MG tablet take 1 tablet by mouth once daily AT 8PM  0  . zolpidem (AMBIEN) 5 MG tablet Take 1 tablet (5 mg total) by mouth at bedtime as needed for sleep. (Patient not taking: Reported on 03/06/2016) 30 tablet 0   Current Facility-Administered Medications on File Prior to Visit  Medication Dose Route Frequency Provider Last Rate Last Dose  . triamcinolone acetonide (KENALOG) 10 MG/ML injection 10 mg  10 mg Other Once Asencion Islam, DPM        Allergies  Allergen Reactions  . Doxycycline     Pancreatitis with nausea  . Cephalexin Other (See  Comments)    Pt states since she had meningitis, she not able to take antibiotic, but did not give reactions.   . Codeine Nausea And Vomiting    Objective: There were no vitals filed for this visit.  General: No acute distress, AAOx3   Right foot: Sutures intact with no dehiscence at surgical site, improving swelling and ecchymosis and hematoma to the incision line over the amputation site at the right foot with dried on surgical gauze dressing, no warmth, scant bloody drainage, no signs of infection noted, Capillary fill time <5 seconds in all remaining digits, gross sensation present via light touch to right foot subjective shooting pains. No pain or crepitation with range of motion right foot.  No pain with calf compression.   Left foot: First toe nailbed appears to continue to be healing well with no acute signs of infection status post total nail avulsion.  Assessment and Plan:  Problem List Items Addressed This Visit    None    Visit Diagnoses    Status post amputation of great toe, right (HCC)    -  Primary   Hematoma       PVD (peripheral vascular disease) (HCC)       Diabetic polyneuropathy associated with type 2 diabetes mellitus (HCC)       S/P nail surgery           -Patient seen and evaluated -Continue to apply Betadine and leave open to air to also assist with the area of drying up and continuing to heal. -Applied Betadine and dry sterile dressing to surgical site right foot secured with ACE wrap and stockinet  -Advised patient to make sure to keep dressings clean, dry, and intact to right surgical site, removing the ACE as needed  -Advised patient to continue with post-op shoe on right foot   -Advised patient to limit activity to necessity with use of wheelchair -Advised patient to elevate as necessary  -We will continue to monitor her circulation and if areas shows difficulty in healing will reconsult interventional radiology since patient had diminished ABIs 0.6 as  performed in hospital may consider this consult at next visit since it will be 1 month after surgery if still shows visible signs of slow healing -Will plan for wound dressing change at next office visit.  Patient still refuses home nursing or wound care consultation.  In the meantime, patient to call office if any issues or problems arise.   Asencion Islam, DPM

## 2017-10-15 ENCOUNTER — Ambulatory Visit (INDEPENDENT_AMBULATORY_CARE_PROVIDER_SITE_OTHER): Payer: Medicare Other | Admitting: Sports Medicine

## 2017-10-15 ENCOUNTER — Encounter: Payer: Self-pay | Admitting: Sports Medicine

## 2017-10-15 DIAGNOSIS — M86171 Other acute osteomyelitis, right ankle and foot: Secondary | ICD-10-CM

## 2017-10-15 DIAGNOSIS — Z89411 Acquired absence of right great toe: Secondary | ICD-10-CM

## 2017-10-15 DIAGNOSIS — I739 Peripheral vascular disease, unspecified: Secondary | ICD-10-CM

## 2017-10-15 DIAGNOSIS — E1142 Type 2 diabetes mellitus with diabetic polyneuropathy: Secondary | ICD-10-CM

## 2017-10-15 DIAGNOSIS — Z9889 Other specified postprocedural states: Secondary | ICD-10-CM

## 2017-10-15 DIAGNOSIS — T148XXA Other injury of unspecified body region, initial encounter: Secondary | ICD-10-CM

## 2017-10-15 NOTE — Progress Notes (Signed)
Subjective: Kirsten Gallegos is a 81 y.o. female patient seen today in office for POV #5  (DOS 09-13-17), S/P right hallux amputation secondary to osteomyelitis and worsening diabetic foot ulcer. Patient denies pain at surgical site; states that her foot does not bother her for as bad as she thought it would after having an amputation, denies calf pain, denies headache, chest pain, shortness of breath, nausea, vomiting, fever, or chills. No other issues noted.   Patient reports that she has been staying off her foot and has been using wheelchair as previously noted.  Patient is assisted by son at this visit.  Patient Active Problem List   Diagnosis Date Noted  . Encounter for chest tube removal   . Emesis   . DM type 2 (diabetes mellitus, type 2) (HCC) 01/28/2016  . Pressure ulcer 01/23/2016  . Pulmonary embolism with acute cor pulmonale (HCC)   . Pleural effusion on right   . Shortness of breath   . Hypoxemia   . Pulmonary embolism (HCC) 01/22/2016  . Pleural effusion   . Pericardial effusion   . Rheumatoid arthritis with positive rheumatoid factor (HCC)   . Mycotic toenails 07/23/2013    Current Outpatient Medications on File Prior to Visit  Medication Sig Dispense Refill  . ALPRAZolam (XANAX) 0.25 MG tablet Take 1 tablet (0.25 mg total) by mouth 2 (two) times daily as needed for anxiety. 30 tablet 0  . aspirin EC 81 MG tablet Take 81 mg by mouth daily. Reported on 03/06/2016    . cadexomer iodine (IODOSORB) 0.9 % gel Apply 1 application topically daily as needed for wound care. Right foot bottom at open area 40 g 1  . clindamycin (CLEOCIN) 300 MG capsule Take 1 capsule (300 mg total) 3 (three) times daily by mouth. 30 capsule 0  . furosemide (LASIX) 20 MG tablet Take 10 mg by mouth daily as needed.   0  . glimepiride (AMARYL) 1 MG tablet Take 1 mg by mouth 2 (two) times daily.  0  . HYDROcodone-acetaminophen (NORCO) 10-325 MG tablet Take 1 tablet by mouth every 8 (eight) hours as  needed (pain). 15 tablet 0  . Insulin Glargine (LANTUS SOLOSTAR) 100 UNIT/ML Solostar Pen Inject 10 Units into the skin at bedtime. 15 mL 11  . levalbuterol (XOPENEX) 0.63 MG/3ML nebulizer solution Take 3 mLs (0.63 mg total) by nebulization every 6 (six) hours as needed for wheezing or shortness of breath. (Patient not taking: Reported on 03/06/2016) 500 mL 1  . levothyroxine (SYNTHROID, LEVOTHROID) 100 MCG tablet Take 100 mcg by mouth daily.  0  . Liniments (BLUE-EMU SUPER STRENGTH EX) Apply 1 application topically daily as needed (neck pain).    . metoprolol (LOPRESSOR) 25 MG tablet Take 1 tablet (25 mg total) by mouth 2 (two) times daily. 60 tablet 0  . omeprazole (PRILOSEC) 20 MG capsule Take 20 mg by mouth daily.    . pantoprazole (PROTONIX) 40 MG tablet Take 1 tablet (40 mg total) by mouth daily. (Patient not taking: Reported on 03/06/2016) 30 tablet 0  . predniSONE (DELTASONE) 10 MG tablet Take 1 tablet (10 mg total) by mouth daily with breakfast. 30 tablet 0  . promethazine (PHENERGAN) 25 MG tablet Take 1 tablet (25 mg total) by mouth every 8 (eight) hours as needed for nausea or vomiting. 20 tablet 0  . silver sulfADIAZINE (SILVADENE) 1 % cream Apply 1 application topically daily. To right foot ulcer 50 g 0  . simvastatin (ZOCOR) 20 MG tablet Take  20 mg by mouth daily.    Marland Kitchen warfarin (COUMADIN) 5 MG tablet take 1 tablet by mouth once daily AT 8PM  0  . zolpidem (AMBIEN) 5 MG tablet Take 1 tablet (5 mg total) by mouth at bedtime as needed for sleep. (Patient not taking: Reported on 03/06/2016) 30 tablet 0   Current Facility-Administered Medications on File Prior to Visit  Medication Dose Route Frequency Provider Last Rate Last Dose  . triamcinolone acetonide (KENALOG) 10 MG/ML injection 10 mg  10 mg Other Once Asencion Islam, DPM        Allergies  Allergen Reactions  . Doxycycline     Pancreatitis with nausea  . Cephalexin Other (See Comments)    Pt states since she had meningitis, she not  able to take antibiotic, but did not give reactions.   . Codeine Nausea And Vomiting    Objective: There were no vitals filed for this visit.  General: No acute distress, AAOx3   Right foot: Sutures intact with no dehiscence at surgical site, improving swelling and ecchymosis and hematoma to the incision line over the amputation site at the right foot with dried on surgicell dressing that is slowly lifting off and eschar, no warmth, scant bloody drainage, no signs of infection noted, Capillary fill time <5 seconds in all remaining digits, gross sensation present via light touch to right foot subjective shooting pains. No pain or crepitation with range of motion right foot.  No pain with calf compression.   Left foot: First toe nailbed appears to continues to be healing well with no acute signs of infection status post total nail avulsion.  Assessment and Plan:  Problem List Items Addressed This Visit    None    Visit Diagnoses    Status post amputation of great toe, right (HCC)    -  Primary   Hematoma       PVD (peripheral vascular disease) (HCC)       Diabetic polyneuropathy associated with type 2 diabetes mellitus (HCC)       S/P nail surgery       Acute osteomyelitis of right ankle or foot (HCC)          -Patient seen and evaluated -Continue to apply Betadine on left and leave open to air to also assist with the area of drying up and continuing to heal. -Applied Betadine and dry sterile dressing to surgical site right foot secured with ACE wrap and stockinet  -Advised patient to make sure to keep dressings clean, dry, and intact to right surgical site, removing the ACE as needed  -Advised patient to continue with post-op shoe on right foot   -Advised patient to limit activity to necessity with use of wheelchair -Advised patient to elevate as necessary  -We will continue to monitor her circulation and if areas shows difficulty in healing will reconsult interventional radiology  since patient had diminished ABIs 0.6 as performed in hospital may consider this consult at next visit since it will be 1 month after surgery if still shows visible signs of slow healing; Appears to be healing will hold off for now on Vascular -Will plan for wound dressing change at next office visit.  Patient still refuses home nursing or wound care consultation and prefers to come to my office weekly.  In the meantime, patient to call office if any issues or problems arise.   Asencion Islam, DPM

## 2017-10-23 ENCOUNTER — Ambulatory Visit (INDEPENDENT_AMBULATORY_CARE_PROVIDER_SITE_OTHER): Payer: Medicare Other | Admitting: Sports Medicine

## 2017-10-23 ENCOUNTER — Encounter: Payer: Self-pay | Admitting: Sports Medicine

## 2017-10-23 DIAGNOSIS — E1142 Type 2 diabetes mellitus with diabetic polyneuropathy: Secondary | ICD-10-CM

## 2017-10-23 DIAGNOSIS — I739 Peripheral vascular disease, unspecified: Secondary | ICD-10-CM

## 2017-10-23 DIAGNOSIS — Z9889 Other specified postprocedural states: Secondary | ICD-10-CM

## 2017-10-23 DIAGNOSIS — Z89411 Acquired absence of right great toe: Secondary | ICD-10-CM

## 2017-10-23 DIAGNOSIS — T8131XA Disruption of external operation (surgical) wound, not elsewhere classified, initial encounter: Secondary | ICD-10-CM | POA: Diagnosis not present

## 2017-10-23 NOTE — Progress Notes (Signed)
Subjective: Kirsten Gallegos is a 81 y.o. female patient seen today in office for POV #6  (DOS 09-13-17), S/P right hallux amputation secondary to osteomyelitis and worsening diabetic foot ulcer. Patient denies pain at surgical site; states that she does get some pain in her heel every denies calf pain, denies headache, chest pain, shortness of breath, nausea, vomiting, fever, or chills. No other issues noted.   Patient reports that she has been staying off her foot and has been using wheelchair as previously noted.  Patient is assisted by son at this visit.  Patient Active Problem List   Diagnosis Date Noted  . Encounter for chest tube removal   . Emesis   . DM type 2 (diabetes mellitus, type 2) (HCC) 01/28/2016  . Pressure ulcer 01/23/2016  . Pulmonary embolism with acute cor pulmonale (HCC)   . Pleural effusion on right   . Shortness of breath   . Hypoxemia   . Pulmonary embolism (HCC) 01/22/2016  . Pleural effusion   . Pericardial effusion   . Rheumatoid arthritis with positive rheumatoid factor (HCC)   . Mycotic toenails 07/23/2013    Current Outpatient Medications on File Prior to Visit  Medication Sig Dispense Refill  . ALPRAZolam (XANAX) 0.25 MG tablet Take 1 tablet (0.25 mg total) by mouth 2 (two) times daily as needed for anxiety. 30 tablet 0  . aspirin EC 81 MG tablet Take 81 mg by mouth daily. Reported on 03/06/2016    . cadexomer iodine (IODOSORB) 0.9 % gel Apply 1 application topically daily as needed for wound care. Right foot bottom at open area 40 g 1  . clindamycin (CLEOCIN) 300 MG capsule Take 1 capsule (300 mg total) 3 (three) times daily by mouth. 30 capsule 0  . furosemide (LASIX) 20 MG tablet Take 10 mg by mouth daily as needed.   0  . glimepiride (AMARYL) 1 MG tablet Take 1 mg by mouth 2 (two) times daily.  0  . HYDROcodone-acetaminophen (NORCO) 10-325 MG tablet Take 1 tablet by mouth every 8 (eight) hours as needed (pain). 15 tablet 0  . Insulin Glargine (LANTUS  SOLOSTAR) 100 UNIT/ML Solostar Pen Inject 10 Units into the skin at bedtime. 15 mL 11  . levalbuterol (XOPENEX) 0.63 MG/3ML nebulizer solution Take 3 mLs (0.63 mg total) by nebulization every 6 (six) hours as needed for wheezing or shortness of breath. (Patient not taking: Reported on 03/06/2016) 500 mL 1  . levothyroxine (SYNTHROID, LEVOTHROID) 100 MCG tablet Take 100 mcg by mouth daily.  0  . Liniments (BLUE-EMU SUPER STRENGTH EX) Apply 1 application topically daily as needed (neck pain).    . metoprolol (LOPRESSOR) 25 MG tablet Take 1 tablet (25 mg total) by mouth 2 (two) times daily. 60 tablet 0  . omeprazole (PRILOSEC) 20 MG capsule Take 20 mg by mouth daily.    . pantoprazole (PROTONIX) 40 MG tablet Take 1 tablet (40 mg total) by mouth daily. (Patient not taking: Reported on 03/06/2016) 30 tablet 0  . predniSONE (DELTASONE) 10 MG tablet Take 1 tablet (10 mg total) by mouth daily with breakfast. 30 tablet 0  . promethazine (PHENERGAN) 25 MG tablet Take 1 tablet (25 mg total) by mouth every 8 (eight) hours as needed for nausea or vomiting. 20 tablet 0  . silver sulfADIAZINE (SILVADENE) 1 % cream Apply 1 application topically daily. To right foot ulcer 50 g 0  . simvastatin (ZOCOR) 20 MG tablet Take 20 mg by mouth daily.    Marland Kitchen  warfarin (COUMADIN) 5 MG tablet take 1 tablet by mouth once daily AT 8PM  0  . zolpidem (AMBIEN) 5 MG tablet Take 1 tablet (5 mg total) by mouth at bedtime as needed for sleep. (Patient not taking: Reported on 03/06/2016) 30 tablet 0   Current Facility-Administered Medications on File Prior to Visit  Medication Dose Route Frequency Provider Last Rate Last Dose  . triamcinolone acetonide (KENALOG) 10 MG/ML injection 10 mg  10 mg Other Once Asencion Islam, DPM        Allergies  Allergen Reactions  . Doxycycline     Pancreatitis with nausea  . Cephalexin Other (See Comments)    Pt states since she had meningitis, she not able to take antibiotic, but did not give reactions.     . Codeine Nausea And Vomiting    Objective: There were no vitals filed for this visit.  General: No acute distress, AAOx3   Right foot: Sutures gapping at surgical site, removed sutures revealing some dehiscence at surgical site measing 4cm in length 0.5 in width, and 0.3cm in depth with subcutaneous tissue exposed with no other acute signs of infection, no warmth, scant bloody drainage, no other signs of infection noted, Capillary fill time <5 seconds in all remaining digits, gross sensation present via light touch to right foot subjective shooting pains at heel at area of dry skin. No pain or crepitation with range of motion right foot.  No pain with calf compression.   Left foot: First toe nailbed appears to continues to be healing well with no acute signs of infection status post total nail avulsion.     Assessment and Plan:  Problem List Items Addressed This Visit    None    Visit Diagnoses    Dehiscence of operative wound, initial encounter    -  Primary   Status post amputation of great toe, right (HCC)       PVD (peripheral vascular disease) (HCC)       Diabetic polyneuropathy associated with type 2 diabetes mellitus (HCC)       S/P nail surgery          -Patient seen and evaluated -Removed remaining sutures and debrided surgical wound to healthy bleeding wound margins and applied Oasis tri-layer wound matrix 3 x 7 cm a lot and will be [id number] use one third of the collagen wound matrix and secured to wound bed using Steri-Strips.  Patient tolerated debridement and application of collagen matrix well without any need for anesthesia. Applied Betadine to right heel all covered with dry sterile dressing to surgical site right foot secured with ACE wrap and stockinet  -Advised patient to make sure to keep dressings clean, dry, and intact to right surgical site, removing the ACE as needed  -Advised patient to continue with post-op shoe on right foot   -Advised patient to limit  activity to necessity with use of wheelchair -Advised patient to elevate as necessary  -We will continue to monitor her circulation and if areas shows difficulty in healing will reconsult interventional radiology since patient had diminished ABIs 0.6 as performed in hospital -Will plan for wound check and possible reapplication of Oasis secured with Steri-Strips at next office visit.  Patient still refuses home nursing or wound care consultation and prefers to come to my office weekly.  In the meantime, patient to call office if any issues or problems arise.   Asencion Islam, DPM

## 2017-10-30 ENCOUNTER — Encounter: Payer: Self-pay | Admitting: Podiatry

## 2017-10-30 ENCOUNTER — Ambulatory Visit (INDEPENDENT_AMBULATORY_CARE_PROVIDER_SITE_OTHER): Payer: Medicare Other | Admitting: Podiatry

## 2017-10-30 DIAGNOSIS — T8131XA Disruption of external operation (surgical) wound, not elsewhere classified, initial encounter: Secondary | ICD-10-CM | POA: Diagnosis not present

## 2017-10-30 DIAGNOSIS — I739 Peripheral vascular disease, unspecified: Secondary | ICD-10-CM

## 2017-10-30 DIAGNOSIS — Z89411 Acquired absence of right great toe: Secondary | ICD-10-CM

## 2017-10-30 NOTE — Progress Notes (Signed)
  Subjective:  Patient ID: Kirsten Gallegos, female    DOB: 11-05-1935,  MRN: [id number]  Chief Complaint  Patient presents with  . Routine Post Op      (DOS 09-13-17), S/P right hallux amputation and it looks a little dark   81 y.o. female returns for wound care.  Had Oasis graft applied at last visit denies nausea vomiting fever chills  Objective:  There were no vitals filed for this visit. General AA&O x3. Normal mood and affect.  Vascular Foot warm to touch.  Neurologic Sensation grossly diminished.  Dermatologic (Wound) Wound Location: Right hallux amputation wound dehiscence Wound Measurement: 1.5 x 3.5 x 0.3.  No probe to bone Wound Base: Mixed Granular/Fibrotic Peri-wound: Reddened Exudate: None: wound tissue dry Verbalization noted at the dorsal aspect of the wound bed  Wound progress: about the same since last check. Right heel intact without any open area  Orthopedic: No pain to palpation either foot.   Assessment & Plan:  Patient was evaluated and treated and all questions answered.  Wound dehiscence right first MPJ -Wound gently cleansed. -Medi-Honey applied for some fibrotic lysis -Up with Dr. Marylene Land in 1 week for possible repeat wound graft application  No Follow-up on file.

## 2017-10-31 DIAGNOSIS — N189 Chronic kidney disease, unspecified: Secondary | ICD-10-CM

## 2017-10-31 DIAGNOSIS — J111 Influenza due to unidentified influenza virus with other respiratory manifestations: Secondary | ICD-10-CM

## 2017-10-31 DIAGNOSIS — R791 Abnormal coagulation profile: Secondary | ICD-10-CM

## 2017-11-01 DIAGNOSIS — J111 Influenza due to unidentified influenza virus with other respiratory manifestations: Secondary | ICD-10-CM | POA: Diagnosis not present

## 2017-11-01 DIAGNOSIS — N189 Chronic kidney disease, unspecified: Secondary | ICD-10-CM | POA: Diagnosis not present

## 2017-11-01 DIAGNOSIS — R791 Abnormal coagulation profile: Secondary | ICD-10-CM | POA: Diagnosis not present

## 2017-11-05 ENCOUNTER — Encounter: Payer: Medicare Other | Admitting: Sports Medicine

## 2017-11-07 ENCOUNTER — Encounter: Payer: Self-pay | Admitting: Sports Medicine

## 2017-11-07 ENCOUNTER — Ambulatory Visit (INDEPENDENT_AMBULATORY_CARE_PROVIDER_SITE_OTHER): Payer: Medicare PPO | Admitting: Sports Medicine

## 2017-11-07 DIAGNOSIS — Z9889 Other specified postprocedural states: Secondary | ICD-10-CM

## 2017-11-07 DIAGNOSIS — T8131XA Disruption of external operation (surgical) wound, not elsewhere classified, initial encounter: Secondary | ICD-10-CM

## 2017-11-07 DIAGNOSIS — Z89411 Acquired absence of right great toe: Secondary | ICD-10-CM

## 2017-11-07 DIAGNOSIS — I739 Peripheral vascular disease, unspecified: Secondary | ICD-10-CM

## 2017-11-07 DIAGNOSIS — E1142 Type 2 diabetes mellitus with diabetic polyneuropathy: Secondary | ICD-10-CM

## 2017-11-07 NOTE — Progress Notes (Signed)
Subjective: Kirsten Gallegos is a 82 y.o. female patient seen today in office for POV #7  (DOS 09-13-17), S/P right hallux amputation secondary to osteomyelitis and worsening diabetic foot ulcer. Patient denies pain at surgical site, denies calf pain, denies headache, chest pain, shortness of breath, nausea, vomiting, fever, or chills.  Reports that she was just released from the hospital was diagnosed with flu and stay in the hospital for 2 days.  No other issues noted.   Patient reports that she has been staying off her foot and has been using wheelchair as previously noted.  Patient is assisted by son at this visit.  Patient Active Problem List   Diagnosis Date Noted  . Encounter for chest tube removal   . Emesis   . DM type 2 (diabetes mellitus, type 2) (HCC) 01/28/2016  . Pressure ulcer 01/23/2016  . Pulmonary embolism with acute cor pulmonale (HCC)   . Pleural effusion on right   . Shortness of breath   . Hypoxemia   . Pulmonary embolism (HCC) 01/22/2016  . Pleural effusion   . Pericardial effusion   . Rheumatoid arthritis with positive rheumatoid factor (HCC)   . Mycotic toenails 07/23/2013    Current Outpatient Medications on File Prior to Visit  Medication Sig Dispense Refill  . ALPRAZolam (XANAX) 0.25 MG tablet Take 1 tablet (0.25 mg total) by mouth 2 (two) times daily as needed for anxiety. 30 tablet 0  . aspirin EC 81 MG tablet Take 81 mg by mouth daily. Reported on 03/06/2016    . cadexomer iodine (IODOSORB) 0.9 % gel Apply 1 application topically daily as needed for wound care. Right foot bottom at open area 40 g 1  . clindamycin (CLEOCIN) 300 MG capsule Take 1 capsule (300 mg total) 3 (three) times daily by mouth. 30 capsule 0  . furosemide (LASIX) 20 MG tablet Take 10 mg by mouth daily as needed.   0  . glimepiride (AMARYL) 1 MG tablet Take 1 mg by mouth 2 (two) times daily.  0  . HYDROcodone-acetaminophen (NORCO) 10-325 MG tablet Take 1 tablet by mouth every 8 (eight)  hours as needed (pain). 15 tablet 0  . Insulin Glargine (LANTUS SOLOSTAR) 100 UNIT/ML Solostar Pen Inject 10 Units into the skin at bedtime. 15 mL 11  . levalbuterol (XOPENEX) 0.63 MG/3ML nebulizer solution Take 3 mLs (0.63 mg total) by nebulization every 6 (six) hours as needed for wheezing or shortness of breath. (Patient not taking: Reported on 03/06/2016) 500 mL 1  . levothyroxine (SYNTHROID, LEVOTHROID) 100 MCG tablet Take 100 mcg by mouth daily.  0  . Liniments (BLUE-EMU SUPER STRENGTH EX) Apply 1 application topically daily as needed (neck pain).    . metoprolol (LOPRESSOR) 25 MG tablet Take 1 tablet (25 mg total) by mouth 2 (two) times daily. 60 tablet 0  . omeprazole (PRILOSEC) 20 MG capsule Take 20 mg by mouth daily.    . pantoprazole (PROTONIX) 40 MG tablet Take 1 tablet (40 mg total) by mouth daily. (Patient not taking: Reported on 03/06/2016) 30 tablet 0  . predniSONE (DELTASONE) 10 MG tablet Take 1 tablet (10 mg total) by mouth daily with breakfast. 30 tablet 0  . promethazine (PHENERGAN) 25 MG tablet Take 1 tablet (25 mg total) by mouth every 8 (eight) hours as needed for nausea or vomiting. 20 tablet 0  . silver sulfADIAZINE (SILVADENE) 1 % cream Apply 1 application topically daily. To right foot ulcer 50 g 0  . simvastatin (ZOCOR) 20  MG tablet Take 20 mg by mouth daily.    Marland Kitchen warfarin (COUMADIN) 5 MG tablet take 1 tablet by mouth once daily AT 8PM  0  . zolpidem (AMBIEN) 5 MG tablet Take 1 tablet (5 mg total) by mouth at bedtime as needed for sleep. (Patient not taking: Reported on 03/06/2016) 30 tablet 0   Current Facility-Administered Medications on File Prior to Visit  Medication Dose Route Frequency Provider Last Rate Last Dose  . triamcinolone acetonide (KENALOG) 10 MG/ML injection 10 mg  10 mg Other Once Asencion Islam, DPM        Allergies  Allergen Reactions  . Doxycycline     Pancreatitis with nausea  . Cephalexin Other (See Comments)    Pt states since she had  meningitis, she not able to take antibiotic, but did not give reactions.   . Codeine Nausea And Vomiting    Objective: There were no vitals filed for this visit.  General: No acute distress, AAOx3   Right foot: At surgical site, dehiscence measuring 2 cm in length 0.5 in width, and 0.3cm in depth with subcutaneous tissue exposed with no other acute signs of infection, no warmth, scant bloody drainage, no other signs of infection noted, Capillary fill time <5 seconds in all remaining digits, gross sensation present via light touch to right foot subjective shooting pains at heel at area of dry skin as previously noted. No pain or crepitation with range of motion right foot.  No pain with calf compression.   Left foot: First toe nailbed appears to continues to be healing well with no acute signs of infection status post total nail avulsion.   Assessment and Plan:  Problem List Items Addressed This Visit    None    Visit Diagnoses    Dehiscence of operative wound, initial encounter    -  Primary   Status post amputation of great toe, right (HCC)       PVD (peripheral vascular disease) (HCC)       Diabetic polyneuropathy associated with type 2 diabetes mellitus (HCC)       S/P nail surgery          -Patient seen and evaluated -Debrided surgical wound to healthy bleeding wound margins and applied a small amount of Iodosorb and secured to wound bed using Steri-Strips.  Will plan to resume Oasis at next visit.  Patient tolerated debridement and application of topical well without any need for anesthesia. Applied dry sterile dressing to surgical site right foot secured with ACE wrap and stockinet  -Advised patient to make sure to keep dressings clean, dry, and intact to right surgical site, removing the ACE as needed  -Advised patient to continue with post-op shoe on right foot    -Advised patient to limit activity to necessity with use of wheelchair -Advised patient to elevate as necessary   -We will continue to monitor her circulation and if areas shows difficulty in healing will reconsult interventional radiology since patient had diminished ABIs 0.6 as performed in hospital -Will plan for wound check and possible reapplication of Oasis secured with Steri-Strips at next office visit.  Patient still refuses home nursing or wound care consultation and prefers to come to my office weekly.  In the meantime, patient to call office if any issues or problems arise.   Asencion Islam, DPM

## 2017-11-12 ENCOUNTER — Encounter: Payer: Self-pay | Admitting: Sports Medicine

## 2017-11-12 ENCOUNTER — Ambulatory Visit (INDEPENDENT_AMBULATORY_CARE_PROVIDER_SITE_OTHER): Payer: Medicare PPO | Admitting: Sports Medicine

## 2017-11-12 DIAGNOSIS — T8131XA Disruption of external operation (surgical) wound, not elsewhere classified, initial encounter: Secondary | ICD-10-CM

## 2017-11-12 DIAGNOSIS — Z89411 Acquired absence of right great toe: Secondary | ICD-10-CM

## 2017-11-12 DIAGNOSIS — Z9889 Other specified postprocedural states: Secondary | ICD-10-CM

## 2017-11-12 DIAGNOSIS — E1142 Type 2 diabetes mellitus with diabetic polyneuropathy: Secondary | ICD-10-CM

## 2017-11-12 DIAGNOSIS — I739 Peripheral vascular disease, unspecified: Secondary | ICD-10-CM

## 2017-11-12 NOTE — Progress Notes (Signed)
Subjective: Kirsten Gallegos is a 82 y.o. female patient seen today in office for POV #8  (DOS 09-13-17), S/P right hallux amputation secondary to osteomyelitis and worsening diabetic foot ulcer. Patient denies pain at surgical site, denies calf pain, denies headache, chest pain, shortness of breath, nausea, vomiting, fever, or chills.  Reports that she is having more pain in her right hip when she goes to stand or get up for food or bathroom or transfer.  No other issues noted.   Patient reports that she has been staying off her foot and has been using wheelchair as previously noted.  Patient is assisted by son at this visit.  Patient Active Problem List   Diagnosis Date Noted  . Encounter for chest tube removal   . Emesis   . DM type 2 (diabetes mellitus, type 2) (HCC) 01/28/2016  . Pressure ulcer 01/23/2016  . Pulmonary embolism with acute cor pulmonale (HCC)   . Pleural effusion on right   . Shortness of breath   . Hypoxemia   . Pulmonary embolism (HCC) 01/22/2016  . Pleural effusion   . Pericardial effusion   . Rheumatoid arthritis with positive rheumatoid factor (HCC)   . Mycotic toenails 07/23/2013    Current Outpatient Medications on File Prior to Visit  Medication Sig Dispense Refill  . ALPRAZolam (XANAX) 0.25 MG tablet Take 1 tablet (0.25 mg total) by mouth 2 (two) times daily as needed for anxiety. 30 tablet 0  . aspirin EC 81 MG tablet Take 81 mg by mouth daily. Reported on 03/06/2016    . cadexomer iodine (IODOSORB) 0.9 % gel Apply 1 application topically daily as needed for wound care. Right foot bottom at open area 40 g 1  . clindamycin (CLEOCIN) 300 MG capsule Take 1 capsule (300 mg total) 3 (three) times daily by mouth. 30 capsule 0  . furosemide (LASIX) 20 MG tablet Take 10 mg by mouth daily as needed.   0  . glimepiride (AMARYL) 1 MG tablet Take 1 mg by mouth 2 (two) times daily.  0  . HYDROcodone-acetaminophen (NORCO) 10-325 MG tablet Take 1 tablet by mouth every 8  (eight) hours as needed (pain). 15 tablet 0  . Insulin Glargine (LANTUS SOLOSTAR) 100 UNIT/ML Solostar Pen Inject 10 Units into the skin at bedtime. 15 mL 11  . levalbuterol (XOPENEX) 0.63 MG/3ML nebulizer solution Take 3 mLs (0.63 mg total) by nebulization every 6 (six) hours as needed for wheezing or shortness of breath. (Patient not taking: Reported on 03/06/2016) 500 mL 1  . levothyroxine (SYNTHROID, LEVOTHROID) 100 MCG tablet Take 100 mcg by mouth daily.  0  . Liniments (BLUE-EMU SUPER STRENGTH EX) Apply 1 application topically daily as needed (neck pain).    . metoprolol (LOPRESSOR) 25 MG tablet Take 1 tablet (25 mg total) by mouth 2 (two) times daily. 60 tablet 0  . omeprazole (PRILOSEC) 20 MG capsule Take 20 mg by mouth daily.    . pantoprazole (PROTONIX) 40 MG tablet Take 1 tablet (40 mg total) by mouth daily. (Patient not taking: Reported on 03/06/2016) 30 tablet 0  . predniSONE (DELTASONE) 10 MG tablet Take 1 tablet (10 mg total) by mouth daily with breakfast. 30 tablet 0  . promethazine (PHENERGAN) 25 MG tablet Take 1 tablet (25 mg total) by mouth every 8 (eight) hours as needed for nausea or vomiting. 20 tablet 0  . silver sulfADIAZINE (SILVADENE) 1 % cream Apply 1 application topically daily. To right foot ulcer 50 g 0  .  simvastatin (ZOCOR) 20 MG tablet Take 20 mg by mouth daily.    Marland Kitchen warfarin (COUMADIN) 5 MG tablet take 1 tablet by mouth once daily AT 8PM  0  . zolpidem (AMBIEN) 5 MG tablet Take 1 tablet (5 mg total) by mouth at bedtime as needed for sleep. (Patient not taking: Reported on 03/06/2016) 30 tablet 0   Current Facility-Administered Medications on File Prior to Visit  Medication Dose Route Frequency Provider Last Rate Last Dose  . triamcinolone acetonide (KENALOG) 10 MG/ML injection 10 mg  10 mg Other Once Asencion Islam, DPM        Allergies  Allergen Reactions  . Doxycycline     Pancreatitis with nausea  . Cephalexin Other (See Comments)    Pt states since she had  meningitis, she not able to take antibiotic, but did not give reactions.   . Codeine Nausea And Vomiting    Objective: There were no vitals filed for this visit.  General: No acute distress, AAOx3   Right foot: At surgical site, dehiscence measuring 2.5 cm in length 0.6 in width, and 0.3cm in depth with decreased subcutaneous tissue exposed with no other acute signs of infection, no warmth, scant bloody drainage, no other signs of infection noted, Capillary fill time <5 seconds in all remaining digits, gross sensation present via light touch to right foot resolved shooting pains at heel at area of dry skin as previously noted. No pain or crepitation with range of motion right foot.  No pain with calf compression.   Left foot: First toe nailbed appears to continues to be healing well with no acute signs of infection status post total nail avulsion.   Assessment and Plan:  Problem List Items Addressed This Visit    None    Visit Diagnoses    Dehiscence of operative wound, initial encounter    -  Primary   Status post amputation of great toe, right (HCC)       PVD (peripheral vascular disease) (HCC)       Diabetic polyneuropathy associated with type 2 diabetes mellitus (HCC)       S/P nail surgery          -Patient seen and evaluated -Cleansed surgical wound to healthy bleeding wound margins and applied a small amount of Medihoney and secured to wound bed using Steri-Strips. Patient tolerated dressing change and application of topical well without any need for anesthesia. Applied dry sterile dressing to surgical site right foot secured with ACE wrap and stockinet  -Advised patient to make sure to keep dressings clean, dry, and intact to right surgical site, removing the ACE as needed  -Advised patient to continue with post-op shoe on right foot    -Advised patient to limit activity to necessity with use of wheelchair -Advised patient to elevate as necessary  -We will continue to monitor  her circulation and if areas shows difficulty in healing will reconsult interventional radiology since patient had diminished ABIs 0.6 as performed in hospital -Will plan for wound check and possible reapplication of medihoney or possible oasis secured with Steri-Strips at next office visit.  Patient still refuses home nursing or wound care consultation and prefers to come to my office weekly.  In the meantime, patient to call office if any issues or problems arise.  -Advised patient to refer to her PCP or Ortho to have her right hip evaluated   Asencion Islam, DPM

## 2017-11-20 ENCOUNTER — Ambulatory Visit (INDEPENDENT_AMBULATORY_CARE_PROVIDER_SITE_OTHER): Payer: Medicare PPO | Admitting: Sports Medicine

## 2017-11-20 ENCOUNTER — Encounter: Payer: Self-pay | Admitting: Sports Medicine

## 2017-11-20 DIAGNOSIS — T8131XA Disruption of external operation (surgical) wound, not elsewhere classified, initial encounter: Secondary | ICD-10-CM

## 2017-11-20 DIAGNOSIS — I739 Peripheral vascular disease, unspecified: Secondary | ICD-10-CM

## 2017-11-20 DIAGNOSIS — Z89411 Acquired absence of right great toe: Secondary | ICD-10-CM

## 2017-11-20 DIAGNOSIS — E1142 Type 2 diabetes mellitus with diabetic polyneuropathy: Secondary | ICD-10-CM

## 2017-11-20 DIAGNOSIS — Z9889 Other specified postprocedural states: Secondary | ICD-10-CM

## 2017-11-20 NOTE — Progress Notes (Signed)
Subjective: Kirsten Gallegos is a 82 y.o. female patient seen today in office for POV #9  (DOS 09-13-17), S/P right hallux amputation secondary to osteomyelitis and worsening diabetic foot ulcer. Patient denies pain at surgical site, states that she did have a little pain from a tight dressing which improved with adjusting the dressing, denies calf pain, denies headache, chest pain, shortness of breath, nausea, vomiting, fever, or chills. No other issues noted.   Patient is assisted by son at this visit.  Patient Active Problem List   Diagnosis Date Noted  . Encounter for chest tube removal   . Emesis   . DM type 2 (diabetes mellitus, type 2) (HCC) 01/28/2016  . Pressure ulcer 01/23/2016  . Pulmonary embolism with acute cor pulmonale (HCC)   . Pleural effusion on right   . Shortness of breath   . Hypoxemia   . Pulmonary embolism (HCC) 01/22/2016  . Pleural effusion   . Pericardial effusion   . Rheumatoid arthritis with positive rheumatoid factor (HCC)   . Mycotic toenails 07/23/2013    Current Outpatient Medications on File Prior to Visit  Medication Sig Dispense Refill  . ALPRAZolam (XANAX) 0.25 MG tablet Take 1 tablet (0.25 mg total) by mouth 2 (two) times daily as needed for anxiety. 30 tablet 0  . aspirin EC 81 MG tablet Take 81 mg by mouth daily. Reported on 03/06/2016    . cadexomer iodine (IODOSORB) 0.9 % gel Apply 1 application topically daily as needed for wound care. Right foot bottom at open area 40 g 1  . clindamycin (CLEOCIN) 300 MG capsule Take 1 capsule (300 mg total) 3 (three) times daily by mouth. 30 capsule 0  . furosemide (LASIX) 20 MG tablet Take 10 mg by mouth daily as needed.   0  . glimepiride (AMARYL) 1 MG tablet Take 1 mg by mouth 2 (two) times daily.  0  . HYDROcodone-acetaminophen (NORCO) 10-325 MG tablet Take 1 tablet by mouth every 8 (eight) hours as needed (pain). 15 tablet 0  . Insulin Glargine (LANTUS SOLOSTAR) 100 UNIT/ML Solostar Pen Inject 10 Units into  the skin at bedtime. 15 mL 11  . levalbuterol (XOPENEX) 0.63 MG/3ML nebulizer solution Take 3 mLs (0.63 mg total) by nebulization every 6 (six) hours as needed for wheezing or shortness of breath. (Patient not taking: Reported on 03/06/2016) 500 mL 1  . levothyroxine (SYNTHROID, LEVOTHROID) 100 MCG tablet Take 100 mcg by mouth daily.  0  . Liniments (BLUE-EMU SUPER STRENGTH EX) Apply 1 application topically daily as needed (neck pain).    . metoprolol (LOPRESSOR) 25 MG tablet Take 1 tablet (25 mg total) by mouth 2 (two) times daily. 60 tablet 0  . omeprazole (PRILOSEC) 20 MG capsule Take 20 mg by mouth daily.    . pantoprazole (PROTONIX) 40 MG tablet Take 1 tablet (40 mg total) by mouth daily. (Patient not taking: Reported on 03/06/2016) 30 tablet 0  . predniSONE (DELTASONE) 10 MG tablet Take 1 tablet (10 mg total) by mouth daily with breakfast. 30 tablet 0  . promethazine (PHENERGAN) 25 MG tablet Take 1 tablet (25 mg total) by mouth every 8 (eight) hours as needed for nausea or vomiting. 20 tablet 0  . silver sulfADIAZINE (SILVADENE) 1 % cream Apply 1 application topically daily. To right foot ulcer 50 g 0  . simvastatin (ZOCOR) 20 MG tablet Take 20 mg by mouth daily.    Marland Kitchen warfarin (COUMADIN) 5 MG tablet take 1 tablet by mouth once daily  AT 8PM  0  . zolpidem (AMBIEN) 5 MG tablet Take 1 tablet (5 mg total) by mouth at bedtime as needed for sleep. (Patient not taking: Reported on 03/06/2016) 30 tablet 0   Current Facility-Administered Medications on File Prior to Visit  Medication Dose Route Frequency Provider Last Rate Last Dose  . triamcinolone acetonide (KENALOG) 10 MG/ML injection 10 mg  10 mg Other Once Asencion Islam, DPM        Allergies  Allergen Reactions  . Doxycycline     Pancreatitis with nausea  . Cephalexin Other (See Comments)    Pt states since she had meningitis, she not able to take antibiotic, but did not give reactions.   . Codeine Nausea And Vomiting    Objective: There  were no vitals filed for this visit.  General: No acute distress, AAOx3   Right foot: At surgical site, dehiscence measuring 2.0 cm in length 0.6 in width, and 0.3cm in depth with decreased subcutaneous tissue exposed with no other acute signs of infection, no warmth, scant bloody drainage, no other signs of infection noted, there is a small abrasion at the dorsal aspect of the right foot and at the side of the right foot along the fifth metatarsal head without any openings or breaks in skin, there is no signs of infection, capillary fill time <5 seconds in all remaining digits, gross sensation present via light touch to right foot resolved shooting pains at heel at area of dry skin as previously noted. No pain or crepitation with range of motion right foot.  No pain with calf compression.   Left foot: First toe nailbed appears to continues to be healing well with no acute signs of infection status post total nail avulsion.   Assessment and Plan:  Problem List Items Addressed This Visit    None    Visit Diagnoses    Dehiscence of operative wound, initial encounter    -  Primary   Status post amputation of great toe, right (HCC)       PVD (peripheral vascular disease) (HCC)       Diabetic polyneuropathy associated with type 2 diabetes mellitus (HCC)       S/P nail surgery          -Patient seen and evaluated -Cleansed surgical wound to healthy bleeding wound margins and applied a small amount of Medihoney and secured to wound bed using Steri-Strips.  Applied external ABD padding to prevent rubbing at abrasion site.  Patient tolerated dressing change and application of topical well. Applied dry sterile dressing to surgical site right foot secured with ACE wrap and stockinet  -Advised patient to make sure to keep dressings clean, dry, and intact to right surgical site, removing the ACE as needed  -Advised patient to continue with post-op shoe on right foot    -Advised patient to limit activity  to necessity with use of wheelchair -Advised patient to elevate as necessary  -We will continue to monitor her circulation and if areas shows difficulty in healing will reconsult interventional radiology since patient had diminished ABIs 0.6 as performed in hospital -Will plan for wound check and possible reapplication of medihoney or possible oasis secured with Steri-Strips at next office visit.  Patient still refuses home nursing or wound care consultation and prefers to come to my office weekly.  In the meantime, patient to call office if any issues or problems arise.   Asencion Islam, DPM

## 2017-11-26 ENCOUNTER — Encounter: Payer: Self-pay | Admitting: Sports Medicine

## 2017-11-26 ENCOUNTER — Ambulatory Visit (INDEPENDENT_AMBULATORY_CARE_PROVIDER_SITE_OTHER): Payer: Medicare PPO | Admitting: Sports Medicine

## 2017-11-26 DIAGNOSIS — E1142 Type 2 diabetes mellitus with diabetic polyneuropathy: Secondary | ICD-10-CM

## 2017-11-26 DIAGNOSIS — I739 Peripheral vascular disease, unspecified: Secondary | ICD-10-CM

## 2017-11-26 DIAGNOSIS — Z89411 Acquired absence of right great toe: Secondary | ICD-10-CM

## 2017-11-26 DIAGNOSIS — T8131XA Disruption of external operation (surgical) wound, not elsewhere classified, initial encounter: Secondary | ICD-10-CM

## 2017-11-26 NOTE — Progress Notes (Signed)
Subjective: Kirsten Gallegos is a 82 y.o. female patient seen today in office for POV # 10 (DOS 09-13-17), S/P right hallux amputation secondary to osteomyelitis and worsening diabetic foot ulcer. Patient denies pain at surgical site, states that she is not in any pain at all especially like last time, denies calf pain, denies headache, chest pain, shortness of breath, nausea, vomiting, fever, or chills. No other issues noted.   Patient is assisted by son at this visit.  Patient Active Problem List   Diagnosis Date Noted  . Encounter for chest tube removal   . Emesis   . DM type 2 (diabetes mellitus, type 2) (HCC) 01/28/2016  . Pressure ulcer 01/23/2016  . Pulmonary embolism with acute cor pulmonale (HCC)   . Pleural effusion on right   . Shortness of breath   . Hypoxemia   . Pulmonary embolism (HCC) 01/22/2016  . Pleural effusion   . Pericardial effusion   . Rheumatoid arthritis with positive rheumatoid factor (HCC)   . Mycotic toenails 07/23/2013    Current Outpatient Medications on File Prior to Visit  Medication Sig Dispense Refill  . ALPRAZolam (XANAX) 0.25 MG tablet Take 1 tablet (0.25 mg total) by mouth 2 (two) times daily as needed for anxiety. 30 tablet 0  . aspirin EC 81 MG tablet Take 81 mg by mouth daily. Reported on 03/06/2016    . cadexomer iodine (IODOSORB) 0.9 % gel Apply 1 application topically daily as needed for wound care. Right foot bottom at open area 40 g 1  . clindamycin (CLEOCIN) 300 MG capsule Take 1 capsule (300 mg total) 3 (three) times daily by mouth. 30 capsule 0  . furosemide (LASIX) 20 MG tablet Take 10 mg by mouth daily as needed.   0  . glimepiride (AMARYL) 1 MG tablet Take 1 mg by mouth 2 (two) times daily.  0  . HYDROcodone-acetaminophen (NORCO) 10-325 MG tablet Take 1 tablet by mouth every 8 (eight) hours as needed (pain). 15 tablet 0  . Insulin Glargine (LANTUS SOLOSTAR) 100 UNIT/ML Solostar Pen Inject 10 Units into the skin at bedtime. 15 mL 11  .  levalbuterol (XOPENEX) 0.63 MG/3ML nebulizer solution Take 3 mLs (0.63 mg total) by nebulization every 6 (six) hours as needed for wheezing or shortness of breath. (Patient not taking: Reported on 03/06/2016) 500 mL 1  . levothyroxine (SYNTHROID, LEVOTHROID) 100 MCG tablet Take 100 mcg by mouth daily.  0  . Liniments (BLUE-EMU SUPER STRENGTH EX) Apply 1 application topically daily as needed (neck pain).    . metoprolol (LOPRESSOR) 25 MG tablet Take 1 tablet (25 mg total) by mouth 2 (two) times daily. 60 tablet 0  . omeprazole (PRILOSEC) 20 MG capsule Take 20 mg by mouth daily.    . pantoprazole (PROTONIX) 40 MG tablet Take 1 tablet (40 mg total) by mouth daily. (Patient not taking: Reported on 03/06/2016) 30 tablet 0  . predniSONE (DELTASONE) 10 MG tablet Take 1 tablet (10 mg total) by mouth daily with breakfast. 30 tablet 0  . promethazine (PHENERGAN) 25 MG tablet Take 1 tablet (25 mg total) by mouth every 8 (eight) hours as needed for nausea or vomiting. 20 tablet 0  . silver sulfADIAZINE (SILVADENE) 1 % cream Apply 1 application topically daily. To right foot ulcer 50 g 0  . simvastatin (ZOCOR) 20 MG tablet Take 20 mg by mouth daily.    Marland Kitchen warfarin (COUMADIN) 5 MG tablet take 1 tablet by mouth once daily AT 8PM  0  .  zolpidem (AMBIEN) 5 MG tablet Take 1 tablet (5 mg total) by mouth at bedtime as needed for sleep. (Patient not taking: Reported on 03/06/2016) 30 tablet 0   Current Facility-Administered Medications on File Prior to Visit  Medication Dose Route Frequency Provider Last Rate Last Dose  . triamcinolone acetonide (KENALOG) 10 MG/ML injection 10 mg  10 mg Other Once Asencion Islam, DPM        Allergies  Allergen Reactions  . Doxycycline     Pancreatitis with nausea  . Cephalexin Other (See Comments)    Pt states since she had meningitis, she not able to take antibiotic, but did not give reactions.   . Codeine Nausea And Vomiting    Objective: There were no vitals filed for this  visit.  General: No acute distress, AAOx3   Right foot: At surgical site, dehiscence measuring 1 cm in length 0.3 in width, and 0.2cm in depth with decreased subcutaneous tissue exposed with no other acute signs of infection, no warmth, scant bloody drainage, no other signs of infection noted, there is a small abrasion at the dorsal aspect of the right foot and at the side of the right foot along the fifth metatarsal head without any openings or breaks in skin, there is no signs of infection, capillary fill time <5 seconds in all remaining digits, gross sensation present via light touch to right foot. No pain or crepitation with range of motion right foot.  No pain with calf compression.   Left foot: First toe nailbed appears to continues to be healing well with no acute signs of infection status post total nail avulsion.   Assessment and Plan:  Problem List Items Addressed This Visit    None    Visit Diagnoses    Dehiscence of operative wound, initial encounter    -  Primary   Status post amputation of great toe, right (HCC)       PVD (peripheral vascular disease) (HCC)       Diabetic polyneuropathy associated with type 2 diabetes mellitus (HCC)          -Patient seen and evaluated -Cleansed surgical wound to healthy bleeding wound margins and applied a small amount of Medihoney and secured to wound bed using Steri-Strips.  Applied external ABD padding to prevent rubbing at abrasion site.  Patient tolerated dressing change and application of topical well. Applied dry sterile dressing to surgical site right foot secured with ACE wrap and stockinet  -Advised patient to make sure to keep dressings clean, dry, and intact to right surgical site, removing the ACE as needed  -Advised patient to continue with post-op shoe on right foot    -Advised patient to limit activity to necessity with use of wheelchair -Advised patient to elevate as necessary  -We will continue to monitor her circulation and  if areas shows difficulty in healing will reconsult interventional radiology since patient had diminished ABIs 0.6 as performed in hospital -Will plan for wound check and possible reapplication of medihoney or possible oasis secured with Steri-Strips at next office visit.  Patient still refuses home nursing or wound care consultation and prefers to come to my office weekly as previously noted.  In the meantime, patient to call office if any issues or problems arise.   Asencion Islam, DPM

## 2017-12-03 ENCOUNTER — Encounter: Payer: Self-pay | Admitting: Sports Medicine

## 2017-12-03 ENCOUNTER — Ambulatory Visit (INDEPENDENT_AMBULATORY_CARE_PROVIDER_SITE_OTHER): Payer: Medicare PPO | Admitting: Sports Medicine

## 2017-12-03 DIAGNOSIS — Z9889 Other specified postprocedural states: Secondary | ICD-10-CM

## 2017-12-03 DIAGNOSIS — I739 Peripheral vascular disease, unspecified: Secondary | ICD-10-CM

## 2017-12-03 DIAGNOSIS — T8131XA Disruption of external operation (surgical) wound, not elsewhere classified, initial encounter: Secondary | ICD-10-CM

## 2017-12-03 DIAGNOSIS — Z89411 Acquired absence of right great toe: Secondary | ICD-10-CM

## 2017-12-03 DIAGNOSIS — E1142 Type 2 diabetes mellitus with diabetic polyneuropathy: Secondary | ICD-10-CM

## 2017-12-03 NOTE — Progress Notes (Signed)
Subjective: Kirsten Gallegos is a 82 y.o. female patient seen today in office for POV # 11 (DOS 09-13-17), S/P right hallux amputation secondary to osteomyelitis and worsening diabetic foot ulcer. Patient denies pain at surgical site, states that she is not in any pain, denies calf pain, denies headache, chest pain, shortness of breath, nausea, vomiting, fever, or chills. No other issues noted.   Patient is assisted by son at this visit.  Patient Active Problem List   Diagnosis Date Noted  . Encounter for chest tube removal   . Emesis   . DM type 2 (diabetes mellitus, type 2) (HCC) 01/28/2016  . Pressure ulcer 01/23/2016  . Pulmonary embolism with acute cor pulmonale (HCC)   . Pleural effusion on right   . Shortness of breath   . Hypoxemia   . Pulmonary embolism (HCC) 01/22/2016  . Pleural effusion   . Pericardial effusion   . Rheumatoid arthritis with positive rheumatoid factor (HCC)   . Mycotic toenails 07/23/2013    Current Outpatient Medications on File Prior to Visit  Medication Sig Dispense Refill  . ALPRAZolam (XANAX) 0.25 MG tablet Take 1 tablet (0.25 mg total) by mouth 2 (two) times daily as needed for anxiety. 30 tablet 0  . aspirin EC 81 MG tablet Take 81 mg by mouth daily. Reported on 03/06/2016    . cadexomer iodine (IODOSORB) 0.9 % gel Apply 1 application topically daily as needed for wound care. Right foot bottom at open area 40 g 1  . clindamycin (CLEOCIN) 300 MG capsule Take 1 capsule (300 mg total) 3 (three) times daily by mouth. 30 capsule 0  . furosemide (LASIX) 20 MG tablet Take 10 mg by mouth daily as needed.   0  . glimepiride (AMARYL) 1 MG tablet Take 1 mg by mouth 2 (two) times daily.  0  . HYDROcodone-acetaminophen (NORCO) 10-325 MG tablet Take 1 tablet by mouth every 8 (eight) hours as needed (pain). 15 tablet 0  . Insulin Glargine (LANTUS SOLOSTAR) 100 UNIT/ML Solostar Pen Inject 10 Units into the skin at bedtime. 15 mL 11  . levalbuterol (XOPENEX) 0.63 MG/3ML  nebulizer solution Take 3 mLs (0.63 mg total) by nebulization every 6 (six) hours as needed for wheezing or shortness of breath. (Patient not taking: Reported on 03/06/2016) 500 mL 1  . levothyroxine (SYNTHROID, LEVOTHROID) 100 MCG tablet Take 100 mcg by mouth daily.  0  . Liniments (BLUE-EMU SUPER STRENGTH EX) Apply 1 application topically daily as needed (neck pain).    . metoprolol (LOPRESSOR) 25 MG tablet Take 1 tablet (25 mg total) by mouth 2 (two) times daily. 60 tablet 0  . omeprazole (PRILOSEC) 20 MG capsule Take 20 mg by mouth daily.    . pantoprazole (PROTONIX) 40 MG tablet Take 1 tablet (40 mg total) by mouth daily. (Patient not taking: Reported on 03/06/2016) 30 tablet 0  . predniSONE (DELTASONE) 10 MG tablet Take 1 tablet (10 mg total) by mouth daily with breakfast. 30 tablet 0  . promethazine (PHENERGAN) 25 MG tablet Take 1 tablet (25 mg total) by mouth every 8 (eight) hours as needed for nausea or vomiting. 20 tablet 0  . silver sulfADIAZINE (SILVADENE) 1 % cream Apply 1 application topically daily. To right foot ulcer 50 g 0  . simvastatin (ZOCOR) 20 MG tablet Take 20 mg by mouth daily.    Marland Kitchen warfarin (COUMADIN) 5 MG tablet take 1 tablet by mouth once daily AT 8PM  0  . zolpidem (AMBIEN) 5 MG  tablet Take 1 tablet (5 mg total) by mouth at bedtime as needed for sleep. (Patient not taking: Reported on 03/06/2016) 30 tablet 0   Current Facility-Administered Medications on File Prior to Visit  Medication Dose Route Frequency Provider Last Rate Last Dose  . triamcinolone acetonide (KENALOG) 10 MG/ML injection 10 mg  10 mg Other Once Asencion Islam, DPM        Allergies  Allergen Reactions  . Doxycycline     Pancreatitis with nausea  . Cephalexin Other (See Comments)    Pt states since she had meningitis, she not able to take antibiotic, but did not give reactions.   . Codeine Nausea And Vomiting    Objective: There were no vitals filed for this visit.  General: No acute distress,  AAOx3   Right foot: At surgical site, dehiscence measuring 0.5 cm in length 0.3 in width, and 0.2cm in depth with decreased subcutaneous tissue exposed with no other acute signs of infection, no warmth, scant bloody drainage, no other signs of infection noted, there is a small abrasion at the dorsal aspect of the right foot and at the side of the right foot along the fifth metatarsal head without any openings or breaks in skin, there is no signs of infection, capillary fill time <5 seconds in all remaining digits, gross sensation present via light touch to right foot. No pain or crepitation with range of motion right foot.  No pain with calf compression.   Left foot: First toe nailbed is well-healed with no acute signs of infection status post total nail avulsion.   Assessment and Plan:  Problem List Items Addressed This Visit    None    Visit Diagnoses    Dehiscence of operative wound, initial encounter    -  Primary   Status post amputation of great toe, right (HCC)       PVD (peripheral vascular disease) (HCC)       Diabetic polyneuropathy associated with type 2 diabetes mellitus (HCC)       S/P nail surgery          -Patient seen and evaluated -Cleansed surgical wound to healthy bleeding wound margins and applied a small amount of Medihoney and secured to wound bed using Steri-Strips.  Patient tolerated dressing change and application of topical well. Applied dry sterile dressing to surgical site right foot secured with ACE wrap and stockinet  -Advised patient to make sure to keep dressings clean, dry, and intact to right surgical site, removing the ACE as needed  -Advised patient to continue with post-op shoe on right foot    -Advised patient to limit activity to necessity with use of wheelchair -Will plan for wound check and possible reapplication of medihoney secured with Steri-Strips at next office visit.  Patient still refuses home nursing or wound care consultation and prefers to  come to my office weekly as previously noted.  In the meantime, patient to call office if any issues or problems arise.   Asencion Islam, DPM

## 2017-12-11 ENCOUNTER — Ambulatory Visit (INDEPENDENT_AMBULATORY_CARE_PROVIDER_SITE_OTHER): Payer: Medicare PPO | Admitting: Sports Medicine

## 2017-12-11 ENCOUNTER — Encounter: Payer: Self-pay | Admitting: Sports Medicine

## 2017-12-11 DIAGNOSIS — Z9889 Other specified postprocedural states: Secondary | ICD-10-CM

## 2017-12-11 DIAGNOSIS — T8131XD Disruption of external operation (surgical) wound, not elsewhere classified, subsequent encounter: Secondary | ICD-10-CM

## 2017-12-11 DIAGNOSIS — Z89411 Acquired absence of right great toe: Secondary | ICD-10-CM

## 2017-12-11 DIAGNOSIS — E1142 Type 2 diabetes mellitus with diabetic polyneuropathy: Secondary | ICD-10-CM

## 2017-12-11 DIAGNOSIS — I739 Peripheral vascular disease, unspecified: Secondary | ICD-10-CM

## 2017-12-11 NOTE — Progress Notes (Signed)
Subjective: Kirsten Gallegos is a 82 y.o. female patient seen today in office for POV # 12 (DOS 09-13-17), S/P right hallux amputation secondary to osteomyelitis and worsening diabetic foot ulcer. Patient denies pain at surgical site, states that she is not in any pain except in her right hip for the past 3 months, denies calf pain, denies headache, chest pain, shortness of breath, nausea, vomiting, fever, or chills. No other issues noted.   Patient is assisted by son at this visit.  Patient Active Problem List   Diagnosis Date Noted  . Encounter for chest tube removal   . Emesis   . DM type 2 (diabetes mellitus, type 2) (HCC) 01/28/2016  . Pressure ulcer 01/23/2016  . Pulmonary embolism with acute cor pulmonale (HCC)   . Pleural effusion on right   . Shortness of breath   . Hypoxemia   . Pulmonary embolism (HCC) 01/22/2016  . Pleural effusion   . Pericardial effusion   . Rheumatoid arthritis with positive rheumatoid factor (HCC)   . Mycotic toenails 07/23/2013    Current Outpatient Medications on File Prior to Visit  Medication Sig Dispense Refill  . ALPRAZolam (XANAX) 0.25 MG tablet Take 1 tablet (0.25 mg total) by mouth 2 (two) times daily as needed for anxiety. 30 tablet 0  . aspirin EC 81 MG tablet Take 81 mg by mouth daily. Reported on 03/06/2016    . cadexomer iodine (IODOSORB) 0.9 % gel Apply 1 application topically daily as needed for wound care. Right foot bottom at open area 40 g 1  . clindamycin (CLEOCIN) 300 MG capsule Take 1 capsule (300 mg total) 3 (three) times daily by mouth. 30 capsule 0  . furosemide (LASIX) 20 MG tablet Take 10 mg by mouth daily as needed.   0  . glimepiride (AMARYL) 1 MG tablet Take 1 mg by mouth 2 (two) times daily.  0  . HYDROcodone-acetaminophen (NORCO) 10-325 MG tablet Take 1 tablet by mouth every 8 (eight) hours as needed (pain). 15 tablet 0  . Insulin Glargine (LANTUS SOLOSTAR) 100 UNIT/ML Solostar Pen Inject 10 Units into the skin at bedtime.  15 mL 11  . levalbuterol (XOPENEX) 0.63 MG/3ML nebulizer solution Take 3 mLs (0.63 mg total) by nebulization every 6 (six) hours as needed for wheezing or shortness of breath. (Patient not taking: Reported on 03/06/2016) 500 mL 1  . levothyroxine (SYNTHROID, LEVOTHROID) 100 MCG tablet Take 100 mcg by mouth daily.  0  . Liniments (BLUE-EMU SUPER STRENGTH EX) Apply 1 application topically daily as needed (neck pain).    . metoprolol (LOPRESSOR) 25 MG tablet Take 1 tablet (25 mg total) by mouth 2 (two) times daily. 60 tablet 0  . omeprazole (PRILOSEC) 20 MG capsule Take 20 mg by mouth daily.    . pantoprazole (PROTONIX) 40 MG tablet Take 1 tablet (40 mg total) by mouth daily. (Patient not taking: Reported on 03/06/2016) 30 tablet 0  . predniSONE (DELTASONE) 10 MG tablet Take 1 tablet (10 mg total) by mouth daily with breakfast. 30 tablet 0  . promethazine (PHENERGAN) 25 MG tablet Take 1 tablet (25 mg total) by mouth every 8 (eight) hours as needed for nausea or vomiting. 20 tablet 0  . silver sulfADIAZINE (SILVADENE) 1 % cream Apply 1 application topically daily. To right foot ulcer 50 g 0  . simvastatin (ZOCOR) 20 MG tablet Take 20 mg by mouth daily.    Marland Kitchen warfarin (COUMADIN) 5 MG tablet take 1 tablet by mouth once daily  AT 8PM  0  . zolpidem (AMBIEN) 5 MG tablet Take 1 tablet (5 mg total) by mouth at bedtime as needed for sleep. (Patient not taking: Reported on 03/06/2016) 30 tablet 0   Current Facility-Administered Medications on File Prior to Visit  Medication Dose Route Frequency Provider Last Rate Last Dose  . triamcinolone acetonide (KENALOG) 10 MG/ML injection 10 mg  10 mg Other Once Asencion Islam, DPM        Allergies  Allergen Reactions  . Doxycycline     Pancreatitis with nausea  . Cephalexin Other (See Comments)    Pt states since she had meningitis, she not able to take antibiotic, but did not give reactions.   . Codeine Nausea And Vomiting    Objective: There were no vitals filed  for this visit.  General: No acute distress, AAOx3   Right foot: At surgical site, dehiscence measuring 0.3 cm in length 0.3 in width, and 0.2cm in depth with decreased subcutaneous tissue exposed with no other acute signs of infection, no warmth, scant bloody drainage, no other signs of infection noted, there is a small abrasion at the dorsal aspect of the right foot and at the side of the right foot along the fifth metatarsal head without any openings or breaks in skin, there is no signs of infection, capillary fill time <5 seconds in all remaining digits, gross sensation present via light touch to right foot. No pain or crepitation with range of motion right foot.  No pain with calf compression.   Left foot: First toe nailbed is well-healed with no acute signs of infection status post total nail avulsion.   Assessment and Plan:  Problem List Items Addressed This Visit    None    Visit Diagnoses    Dehiscence of operative wound, subsequent encounter    -  Primary   Status post amputation of great toe, right (HCC)       PVD (peripheral vascular disease) (HCC)       Diabetic polyneuropathy associated with type 2 diabetes mellitus (HCC)       S/P nail surgery          -Patient seen and evaluated -No acute changes with treatment plan of care -Cleansed surgical wound to healthy bleeding wound margins and applied a small amount of Medihoney and secured to wound bed using Steri-Strips.  Patient tolerated dressing change and application of topical well. Applied dry sterile dressing to surgical site right foot secured with ACE wrap and stockinet  -Advised patient to make sure to keep dressings clean, dry, and intact to right surgical site, removing the ACE as needed  -Advised patient to continue with post-op shoe on right foot    -Advised patient to limit activity to necessity with use of wheelchair -Will plan for wound check and possible reapplication of medihoney secured with Steri-Strips at  next office visit.  Patient still refuses home nursing or wound care consultation and prefers to come to my office weekly as previously noted.  In the meantime, patient to call office if any issues or problems arise.   Asencion Islam, DPM

## 2017-12-19 ENCOUNTER — Encounter: Payer: Self-pay | Admitting: Sports Medicine

## 2017-12-19 ENCOUNTER — Ambulatory Visit (INDEPENDENT_AMBULATORY_CARE_PROVIDER_SITE_OTHER): Payer: Medicare PPO | Admitting: Sports Medicine

## 2017-12-19 DIAGNOSIS — Z89411 Acquired absence of right great toe: Secondary | ICD-10-CM

## 2017-12-19 DIAGNOSIS — Z9889 Other specified postprocedural states: Secondary | ICD-10-CM

## 2017-12-19 DIAGNOSIS — T8131XD Disruption of external operation (surgical) wound, not elsewhere classified, subsequent encounter: Secondary | ICD-10-CM

## 2017-12-19 DIAGNOSIS — I739 Peripheral vascular disease, unspecified: Secondary | ICD-10-CM

## 2017-12-19 DIAGNOSIS — E1142 Type 2 diabetes mellitus with diabetic polyneuropathy: Secondary | ICD-10-CM

## 2017-12-19 NOTE — Progress Notes (Signed)
Subjective: Kirsten Gallegos is a 82 y.o. female patient seen today in office for POV # 13 (DOS 09-13-17), S/P right hallux amputation secondary to osteomyelitis and worsening diabetic foot ulcer. Patient denies pain at surgical site, denies calf pain, denies headache, chest pain, shortness of breath, nausea, vomiting, fever, or chills. No other issues noted.   Patient is assisted by son at this visit.  Patient Active Problem List   Diagnosis Date Noted  . Encounter for chest tube removal   . Emesis   . DM type 2 (diabetes mellitus, type 2) (HCC) 01/28/2016  . Pressure ulcer 01/23/2016  . Pulmonary embolism with acute cor pulmonale (HCC)   . Pleural effusion on right   . Shortness of breath   . Hypoxemia   . Pulmonary embolism (HCC) 01/22/2016  . Pleural effusion   . Pericardial effusion   . Rheumatoid arthritis with positive rheumatoid factor (HCC)   . Mycotic toenails 07/23/2013    Current Outpatient Medications on File Prior to Visit  Medication Sig Dispense Refill  . ALPRAZolam (XANAX) 0.25 MG tablet Take 1 tablet (0.25 mg total) by mouth 2 (two) times daily as needed for anxiety. 30 tablet 0  . aspirin EC 81 MG tablet Take 81 mg by mouth daily. Reported on 03/06/2016    . cadexomer iodine (IODOSORB) 0.9 % gel Apply 1 application topically daily as needed for wound care. Right foot bottom at open area 40 g 1  . clindamycin (CLEOCIN) 300 MG capsule Take 1 capsule (300 mg total) 3 (three) times daily by mouth. 30 capsule 0  . furosemide (LASIX) 20 MG tablet Take 10 mg by mouth daily as needed.   0  . glimepiride (AMARYL) 1 MG tablet Take 1 mg by mouth 2 (two) times daily.  0  . HYDROcodone-acetaminophen (NORCO) 10-325 MG tablet Take 1 tablet by mouth every 8 (eight) hours as needed (pain). 15 tablet 0  . Insulin Glargine (LANTUS SOLOSTAR) 100 UNIT/ML Solostar Pen Inject 10 Units into the skin at bedtime. 15 mL 11  . levalbuterol (XOPENEX) 0.63 MG/3ML nebulizer solution Take 3 mLs (0.63  mg total) by nebulization every 6 (six) hours as needed for wheezing or shortness of breath. (Patient not taking: Reported on 03/06/2016) 500 mL 1  . levothyroxine (SYNTHROID, LEVOTHROID) 100 MCG tablet Take 100 mcg by mouth daily.  0  . Liniments (BLUE-EMU SUPER STRENGTH EX) Apply 1 application topically daily as needed (neck pain).    . metoprolol (LOPRESSOR) 25 MG tablet Take 1 tablet (25 mg total) by mouth 2 (two) times daily. 60 tablet 0  . omeprazole (PRILOSEC) 20 MG capsule Take 20 mg by mouth daily.    . pantoprazole (PROTONIX) 40 MG tablet Take 1 tablet (40 mg total) by mouth daily. (Patient not taking: Reported on 03/06/2016) 30 tablet 0  . predniSONE (DELTASONE) 10 MG tablet Take 1 tablet (10 mg total) by mouth daily with breakfast. 30 tablet 0  . promethazine (PHENERGAN) 25 MG tablet Take 1 tablet (25 mg total) by mouth every 8 (eight) hours as needed for nausea or vomiting. 20 tablet 0  . silver sulfADIAZINE (SILVADENE) 1 % cream Apply 1 application topically daily. To right foot ulcer 50 g 0  . simvastatin (ZOCOR) 20 MG tablet Take 20 mg by mouth daily.    Marland Kitchen warfarin (COUMADIN) 5 MG tablet take 1 tablet by mouth once daily AT 8PM  0  . zolpidem (AMBIEN) 5 MG tablet Take 1 tablet (5 mg total) by  mouth at bedtime as needed for sleep. (Patient not taking: Reported on 03/06/2016) 30 tablet 0   Current Facility-Administered Medications on File Prior to Visit  Medication Dose Route Frequency Provider Last Rate Last Dose  . triamcinolone acetonide (KENALOG) 10 MG/ML injection 10 mg  10 mg Other Once Asencion Islam, DPM        Allergies  Allergen Reactions  . Doxycycline     Pancreatitis with nausea  . Cephalexin Other (See Comments)    Pt states since she had meningitis, she not able to take antibiotic, but did not give reactions.   . Codeine Nausea And Vomiting    Objective: There were no vitals filed for this visit.  General: No acute distress, AAOx3   Right foot: At surgical  site, dehiscence measuring 0.1 cm in length 0.1 in width, and 0.2cm in depth with decreased subcutaneous tissue exposed with no other acute signs of infection, no warmth, scant bloody drainage, no other signs of infection noted, there is a small abrasion at the dorsal aspect of the right foot and at the side of the right foot along the fifth metatarsal head without any openings or breaks in skin, there is no signs of infection, capillary fill time <5 seconds in all remaining digits, gross sensation present via light touch to right foot. No pain or crepitation with range of motion right foot.  No pain with calf compression.   Left foot: First toe nailbed is well-healed with no acute signs of infection status post total nail avulsion.   Assessment and Plan:  Problem List Items Addressed This Visit    None    Visit Diagnoses    Dehiscence of operative wound, subsequent encounter    -  Primary   Status post amputation of great toe, right (HCC)       PVD (peripheral vascular disease) (HCC)       Diabetic polyneuropathy associated with type 2 diabetes mellitus (HCC)       S/P nail surgery          -Patient seen and evaluated -No acute changes with treatment plan of care -Cleansed surgical wound to healthy bleeding wound margins and applied Steri-Strips. Applied dry sterile dressing to surgical site right foot secured with ACE wrap and stockinet  -Advised patient to make sure to keep dressings clean, dry, and intact to right surgical site, removing the ACE as needed  -Advised patient to continue with post-op shoe on right foot    -Advised patient to limit activity to necessity with use of wheelchair -Will plan for wound check and possible reapplication of Steri-Strips at next office visit.  Patient still refuses home nursing or wound care consultation and prefers to come to my office weekly as previously noted.  In the meantime, patient to call office if any issues or problems arise.   Asencion Islam, DPM

## 2017-12-25 ENCOUNTER — Ambulatory Visit (INDEPENDENT_AMBULATORY_CARE_PROVIDER_SITE_OTHER): Payer: Medicare PPO | Admitting: Sports Medicine

## 2017-12-25 ENCOUNTER — Encounter: Payer: Self-pay | Admitting: Sports Medicine

## 2017-12-25 DIAGNOSIS — Z89411 Acquired absence of right great toe: Secondary | ICD-10-CM

## 2017-12-25 DIAGNOSIS — Z9889 Other specified postprocedural states: Secondary | ICD-10-CM

## 2017-12-25 DIAGNOSIS — I739 Peripheral vascular disease, unspecified: Secondary | ICD-10-CM

## 2017-12-25 DIAGNOSIS — T8131XD Disruption of external operation (surgical) wound, not elsewhere classified, subsequent encounter: Secondary | ICD-10-CM

## 2017-12-25 DIAGNOSIS — E1142 Type 2 diabetes mellitus with diabetic polyneuropathy: Secondary | ICD-10-CM

## 2017-12-25 NOTE — Progress Notes (Signed)
Subjective: Reed Eifert is a 82 y.o. female patient seen today in office for POV # 14 (DOS 09-13-17), S/P right hallux amputation secondary to osteomyelitis and worsening diabetic foot ulcer. Patient is here for a wound check.  Denies pain at surgical site, denies calf pain, denies headache, chest pain, shortness of breath, nausea, vomiting, fever, or chills.  Reports that she had to go to the hospital for high blood sugar.  No other issues noted.   Patient is assisted by son at this visit.  Patient Active Problem List   Diagnosis Date Noted  . Encounter for chest tube removal   . Emesis   . DM type 2 (diabetes mellitus, type 2) (HCC) 01/28/2016  . Pressure ulcer 01/23/2016  . Pulmonary embolism with acute cor pulmonale (HCC)   . Pleural effusion on right   . Shortness of breath   . Hypoxemia   . Pulmonary embolism (HCC) 01/22/2016  . Pleural effusion   . Pericardial effusion   . Rheumatoid arthritis with positive rheumatoid factor (HCC)   . Mycotic toenails 07/23/2013    Current Outpatient Medications on File Prior to Visit  Medication Sig Dispense Refill  . ALPRAZolam (XANAX) 0.25 MG tablet Take 1 tablet (0.25 mg total) by mouth 2 (two) times daily as needed for anxiety. 30 tablet 0  . aspirin EC 81 MG tablet Take 81 mg by mouth daily. Reported on 03/06/2016    . cadexomer iodine (IODOSORB) 0.9 % gel Apply 1 application topically daily as needed for wound care. Right foot bottom at open area 40 g 1  . clindamycin (CLEOCIN) 300 MG capsule Take 1 capsule (300 mg total) 3 (three) times daily by mouth. 30 capsule 0  . furosemide (LASIX) 20 MG tablet Take 10 mg by mouth daily as needed.   0  . glimepiride (AMARYL) 1 MG tablet Take 1 mg by mouth 2 (two) times daily.  0  . HYDROcodone-acetaminophen (NORCO) 10-325 MG tablet Take 1 tablet by mouth every 8 (eight) hours as needed (pain). 15 tablet 0  . Insulin Glargine (LANTUS SOLOSTAR) 100 UNIT/ML Solostar Pen Inject 10 Units into the skin  at bedtime. 15 mL 11  . levalbuterol (XOPENEX) 0.63 MG/3ML nebulizer solution Take 3 mLs (0.63 mg total) by nebulization every 6 (six) hours as needed for wheezing or shortness of breath. (Patient not taking: Reported on 03/06/2016) 500 mL 1  . levothyroxine (SYNTHROID, LEVOTHROID) 100 MCG tablet Take 100 mcg by mouth daily.  0  . Liniments (BLUE-EMU SUPER STRENGTH EX) Apply 1 application topically daily as needed (neck pain).    . metoprolol (LOPRESSOR) 25 MG tablet Take 1 tablet (25 mg total) by mouth 2 (two) times daily. 60 tablet 0  . omeprazole (PRILOSEC) 20 MG capsule Take 20 mg by mouth daily.    . pantoprazole (PROTONIX) 40 MG tablet Take 1 tablet (40 mg total) by mouth daily. (Patient not taking: Reported on 03/06/2016) 30 tablet 0  . predniSONE (DELTASONE) 10 MG tablet Take 1 tablet (10 mg total) by mouth daily with breakfast. 30 tablet 0  . promethazine (PHENERGAN) 25 MG tablet Take 1 tablet (25 mg total) by mouth every 8 (eight) hours as needed for nausea or vomiting. 20 tablet 0  . silver sulfADIAZINE (SILVADENE) 1 % cream Apply 1 application topically daily. To right foot ulcer 50 g 0  . simvastatin (ZOCOR) 20 MG tablet Take 20 mg by mouth daily.    Marland Kitchen warfarin (COUMADIN) 5 MG tablet take 1 tablet  by mouth once daily AT 8PM  0  . zolpidem (AMBIEN) 5 MG tablet Take 1 tablet (5 mg total) by mouth at bedtime as needed for sleep. (Patient not taking: Reported on 03/06/2016) 30 tablet 0   Current Facility-Administered Medications on File Prior to Visit  Medication Dose Route Frequency Provider Last Rate Last Dose  . triamcinolone acetonide (KENALOG) 10 MG/ML injection 10 mg  10 mg Other Once Asencion Islam, DPM        Allergies  Allergen Reactions  . Doxycycline     Pancreatitis with nausea  . Cephalexin Other (See Comments)    Pt states since she had meningitis, she not able to take antibiotic, but did not give reactions.   . Codeine Nausea And Vomiting    Objective: There were no  vitals filed for this visit.  General: No acute distress, AAOx3   Right foot: At surgical site, dehiscence measuring 0.1 cm in length 0.1 in width, and 0.1cm in depth with no subcutaneous tissue exposed with no other acute signs of infection, no warmth, scant bloody drainage, no other signs of infection noted, there is a small abrasion at the dorsal aspect of the right foot and at the side of the right foot along the fifth metatarsal head without any openings or breaks in skin, there is no signs of infection, capillary fill time <5 seconds in all remaining digits, gross sensation present via light touch to right foot. No pain or crepitation with range of motion right foot.  No pain with calf compression.   Left foot: First toe nailbed remains well-healed with no acute signs of infection status post total nail avulsion.   Assessment and Plan:  Problem List Items Addressed This Visit    None    Visit Diagnoses    Dehiscence of operative wound, subsequent encounter    -  Primary   Status post amputation of great toe, right (HCC)       PVD (peripheral vascular disease) (HCC)       Diabetic polyneuropathy associated with type 2 diabetes mellitus (HCC)       S/P nail surgery          -Patient seen and evaluated -No acute changes with treatment plan of care -Cleansed surgical wound to healthy bleeding wound margins and applied very small amount of medihoney and Steri-Strips. Applied dry sterile dressing to surgical site right foot secured with ACE wrap and stockinet  -Advised patient to make sure to keep dressings clean, dry, and intact to right surgical site, removing the ACE as needed  -Advised patient to continue with post-op shoe on right foot    -Advised patient to limit activity to necessity with use of wheelchair -Will plan for wound check and possible reapplication of Steri-Strips at next office visit.  Likely at next visit patient probably will not need any dressings and the Steri-Strips  and a sterile sock area continues with improving.  Patient still refuses home nursing or wound care consultation and prefers to come to my office weekly as previously noted.  In the meantime, patient to call office if any issues or problems arise.   Asencion Islam, DPM

## 2018-01-01 ENCOUNTER — Ambulatory Visit (INDEPENDENT_AMBULATORY_CARE_PROVIDER_SITE_OTHER): Payer: Medicare PPO | Admitting: Sports Medicine

## 2018-01-01 ENCOUNTER — Encounter: Payer: Self-pay | Admitting: Sports Medicine

## 2018-01-01 DIAGNOSIS — Z9889 Other specified postprocedural states: Secondary | ICD-10-CM

## 2018-01-01 DIAGNOSIS — I739 Peripheral vascular disease, unspecified: Secondary | ICD-10-CM

## 2018-01-01 DIAGNOSIS — Z89411 Acquired absence of right great toe: Secondary | ICD-10-CM

## 2018-01-01 DIAGNOSIS — T8131XD Disruption of external operation (surgical) wound, not elsewhere classified, subsequent encounter: Secondary | ICD-10-CM

## 2018-01-01 DIAGNOSIS — E1142 Type 2 diabetes mellitus with diabetic polyneuropathy: Secondary | ICD-10-CM

## 2018-01-01 NOTE — Progress Notes (Signed)
Subjective: Kirsten Gallegos is a 82 y.o. female patient seen today in office for POV # 15 (DOS 09-13-17), S/P right hallux amputation secondary to osteomyelitis and worsening diabetic foot ulcer. Patient is here for a wound check.  Denies pain at surgical site, denies calf pain, denies headache, chest pain, shortness of breath, nausea, vomiting, fever, or chills.  Reports that she is still having a difficult time controlling her sugars had to have her insulin increased.  No other issues noted.   Patient is assisted by son at this visit.  Patient Active Problem List   Diagnosis Date Noted  . Encounter for chest tube removal   . Emesis   . DM type 2 (diabetes mellitus, type 2) (HCC) 01/28/2016  . Pressure ulcer 01/23/2016  . Pulmonary embolism with acute cor pulmonale (HCC)   . Pleural effusion on right   . Shortness of breath   . Hypoxemia   . Pulmonary embolism (HCC) 01/22/2016  . Pleural effusion   . Pericardial effusion   . Rheumatoid arthritis with positive rheumatoid factor (HCC)   . Mycotic toenails 07/23/2013    Current Outpatient Medications on File Prior to Visit  Medication Sig Dispense Refill  . ALPRAZolam (XANAX) 0.25 MG tablet Take 1 tablet (0.25 mg total) by mouth 2 (two) times daily as needed for anxiety. 30 tablet 0  . aspirin EC 81 MG tablet Take 81 mg by mouth daily. Reported on 03/06/2016    . cadexomer iodine (IODOSORB) 0.9 % gel Apply 1 application topically daily as needed for wound care. Right foot bottom at open area 40 g 1  . clindamycin (CLEOCIN) 300 MG capsule Take 1 capsule (300 mg total) 3 (three) times daily by mouth. 30 capsule 0  . furosemide (LASIX) 20 MG tablet Take 10 mg by mouth daily as needed.   0  . glimepiride (AMARYL) 1 MG tablet Take 1 mg by mouth 2 (two) times daily.  0  . HYDROcodone-acetaminophen (NORCO) 10-325 MG tablet Take 1 tablet by mouth every 8 (eight) hours as needed (pain). 15 tablet 0  . Insulin Glargine (LANTUS SOLOSTAR) 100 UNIT/ML  Solostar Pen Inject 10 Units into the skin at bedtime. 15 mL 11  . levalbuterol (XOPENEX) 0.63 MG/3ML nebulizer solution Take 3 mLs (0.63 mg total) by nebulization every 6 (six) hours as needed for wheezing or shortness of breath. (Patient not taking: Reported on 03/06/2016) 500 mL 1  . levothyroxine (SYNTHROID, LEVOTHROID) 100 MCG tablet Take 100 mcg by mouth daily.  0  . Liniments (BLUE-EMU SUPER STRENGTH EX) Apply 1 application topically daily as needed (neck pain).    . metoprolol (LOPRESSOR) 25 MG tablet Take 1 tablet (25 mg total) by mouth 2 (two) times daily. 60 tablet 0  . omeprazole (PRILOSEC) 20 MG capsule Take 20 mg by mouth daily.    . pantoprazole (PROTONIX) 40 MG tablet Take 1 tablet (40 mg total) by mouth daily. (Patient not taking: Reported on 03/06/2016) 30 tablet 0  . predniSONE (DELTASONE) 10 MG tablet Take 1 tablet (10 mg total) by mouth daily with breakfast. 30 tablet 0  . promethazine (PHENERGAN) 25 MG tablet Take 1 tablet (25 mg total) by mouth every 8 (eight) hours as needed for nausea or vomiting. 20 tablet 0  . silver sulfADIAZINE (SILVADENE) 1 % cream Apply 1 application topically daily. To right foot ulcer 50 g 0  . simvastatin (ZOCOR) 20 MG tablet Take 20 mg by mouth daily.    Marland Kitchen warfarin (COUMADIN) 5  MG tablet take 1 tablet by mouth once daily AT 8PM  0  . zolpidem (AMBIEN) 5 MG tablet Take 1 tablet (5 mg total) by mouth at bedtime as needed for sleep. (Patient not taking: Reported on 03/06/2016) 30 tablet 0   Current Facility-Administered Medications on File Prior to Visit  Medication Dose Route Frequency Provider Last Rate Last Dose  . triamcinolone acetonide (KENALOG) 10 MG/ML injection 10 mg  10 mg Other Once Asencion Islam, DPM        Allergies  Allergen Reactions  . Doxycycline     Pancreatitis with nausea  . Cephalexin Other (See Comments)    Pt states since she had meningitis, she not able to take antibiotic, but did not give reactions.   . Codeine Nausea And  Vomiting    Objective: There were no vitals filed for this visit.  General: No acute distress, AAOx3   Right foot: At surgical site, dehiscence measuring 0.1 cm in length 0.1 in width, and 0.1cm in depth, same as last visit, with no subcutaneous tissue exposed with no other acute signs of infection, no warmth, scant bloody drainage, no other signs of infection noted, there is a small abrasion at the dorsal aspect of the right foot and at the side of the right foot along the fifth metatarsal head without any openings or breaks in skin, there is no signs of infection, capillary fill time <5 seconds in all remaining digits, gross sensation present via light touch to right foot. No pain or crepitation with range of motion right foot.  No pain with calf compression.   Left foot: First toe nailbed remains well-healed with no acute signs of infection status post total nail avulsion.   Assessment and Plan:  Problem List Items Addressed This Visit    None    Visit Diagnoses    Dehiscence of operative wound, subsequent encounter    -  Primary   Status post amputation of great toe, right (HCC)       PVD (peripheral vascular disease) (HCC)       Diabetic polyneuropathy associated with type 2 diabetes mellitus (HCC)       S/P nail surgery          -Patient seen and evaluated -No acute changes with treatment plan of care -Cleansed surgical wound to healthy bleeding wound margins and applied Steri-Strips and stockinet  -Advised patient to make sure to keep dressings clean, dry, and intact to right surgical site, removing the stockinette as needed to monitor and closely check the right foot -Advised patient to continue with post-op shoe on right foot    -Advised patient to limit activity to necessity with use of wheelchair -Will plan for wound check and possible reapplication of Steri-Strips at next office visit. In the meantime, patient to call office if any issues or problems arise.   Asencion Islam, DPM

## 2018-01-08 ENCOUNTER — Ambulatory Visit (INDEPENDENT_AMBULATORY_CARE_PROVIDER_SITE_OTHER): Payer: Medicare PPO | Admitting: Sports Medicine

## 2018-01-08 ENCOUNTER — Encounter: Payer: Self-pay | Admitting: Sports Medicine

## 2018-01-08 DIAGNOSIS — Z9889 Other specified postprocedural states: Secondary | ICD-10-CM

## 2018-01-08 DIAGNOSIS — T8131XD Disruption of external operation (surgical) wound, not elsewhere classified, subsequent encounter: Secondary | ICD-10-CM

## 2018-01-08 DIAGNOSIS — E1142 Type 2 diabetes mellitus with diabetic polyneuropathy: Secondary | ICD-10-CM

## 2018-01-08 DIAGNOSIS — Z89411 Acquired absence of right great toe: Secondary | ICD-10-CM

## 2018-01-08 DIAGNOSIS — I739 Peripheral vascular disease, unspecified: Secondary | ICD-10-CM

## 2018-01-08 NOTE — Progress Notes (Signed)
Subjective: Kirsten Gallegos is a 82 y.o. female patient seen today in office for POV # 16 (DOS 09-13-17), S/P right hallux amputation secondary to osteomyelitis and worsening diabetic foot ulcer. Patient is here for a wound check.  Denies pain at surgical site, reports that she is not feeling well today having a difficult time as the previously noted controlling her blood sugars.  No other issues noted.   Patient is assisted by son at this visit.  Patient Active Problem List   Diagnosis Date Noted  . Encounter for chest tube removal   . Emesis   . DM type 2 (diabetes mellitus, type 2) (HCC) 01/28/2016  . Pressure ulcer 01/23/2016  . Pulmonary embolism with acute cor pulmonale (HCC)   . Pleural effusion on right   . Shortness of breath   . Hypoxemia   . Pulmonary embolism (HCC) 01/22/2016  . Pleural effusion   . Pericardial effusion   . Rheumatoid arthritis with positive rheumatoid factor (HCC)   . Mycotic toenails 07/23/2013    Current Outpatient Medications on File Prior to Visit  Medication Sig Dispense Refill  . ALPRAZolam (XANAX) 0.25 MG tablet Take 1 tablet (0.25 mg total) by mouth 2 (two) times daily as needed for anxiety. 30 tablet 0  . aspirin EC 81 MG tablet Take 81 mg by mouth daily. Reported on 03/06/2016    . cadexomer iodine (IODOSORB) 0.9 % gel Apply 1 application topically daily as needed for wound care. Right foot bottom at open area 40 g 1  . clindamycin (CLEOCIN) 300 MG capsule Take 1 capsule (300 mg total) 3 (three) times daily by mouth. 30 capsule 0  . furosemide (LASIX) 20 MG tablet Take 10 mg by mouth daily as needed.   0  . glimepiride (AMARYL) 1 MG tablet Take 1 mg by mouth 2 (two) times daily.  0  . HYDROcodone-acetaminophen (NORCO) 10-325 MG tablet Take 1 tablet by mouth every 8 (eight) hours as needed (pain). 15 tablet 0  . Insulin Glargine (LANTUS SOLOSTAR) 100 UNIT/ML Solostar Pen Inject 10 Units into the skin at bedtime. 15 mL 11  . levalbuterol (XOPENEX)  0.63 MG/3ML nebulizer solution Take 3 mLs (0.63 mg total) by nebulization every 6 (six) hours as needed for wheezing or shortness of breath. (Patient not taking: Reported on 03/06/2016) 500 mL 1  . levothyroxine (SYNTHROID, LEVOTHROID) 100 MCG tablet Take 100 mcg by mouth daily.  0  . Liniments (BLUE-EMU SUPER STRENGTH EX) Apply 1 application topically daily as needed (neck pain).    . metoprolol (LOPRESSOR) 25 MG tablet Take 1 tablet (25 mg total) by mouth 2 (two) times daily. 60 tablet 0  . omeprazole (PRILOSEC) 20 MG capsule Take 20 mg by mouth daily.    . pantoprazole (PROTONIX) 40 MG tablet Take 1 tablet (40 mg total) by mouth daily. (Patient not taking: Reported on 03/06/2016) 30 tablet 0  . predniSONE (DELTASONE) 10 MG tablet Take 1 tablet (10 mg total) by mouth daily with breakfast. 30 tablet 0  . promethazine (PHENERGAN) 25 MG tablet Take 1 tablet (25 mg total) by mouth every 8 (eight) hours as needed for nausea or vomiting. 20 tablet 0  . silver sulfADIAZINE (SILVADENE) 1 % cream Apply 1 application topically daily. To right foot ulcer 50 g 0  . simvastatin (ZOCOR) 20 MG tablet Take 20 mg by mouth daily.    Marland Kitchen warfarin (COUMADIN) 5 MG tablet take 1 tablet by mouth once daily AT 8PM  0  .  zolpidem (AMBIEN) 5 MG tablet Take 1 tablet (5 mg total) by mouth at bedtime as needed for sleep. (Patient not taking: Reported on 03/06/2016) 30 tablet 0   Current Facility-Administered Medications on File Prior to Visit  Medication Dose Route Frequency Provider Last Rate Last Dose  . triamcinolone acetonide (KENALOG) 10 MG/ML injection 10 mg  10 mg Other Once Asencion Islam, DPM        Allergies  Allergen Reactions  . Doxycycline     Pancreatitis with nausea  . Cephalexin Other (See Comments)    Pt states since she had meningitis, she not able to take antibiotic, but did not give reactions.   . Codeine Nausea And Vomiting    Objective: There were no vitals filed for this visit.  General: No acute  distress, AAOx3   Right foot: At surgical site, dehiscence area is prematurely healed, no warmth, no drainage, no other signs of infection noted, there is a small abrasion at the dorsal aspect of the right foot and at the side of the right foot along the fifth metatarsal head without any openings or breaks in skin, there is no signs of infection, capillary fill time <5 seconds in all remaining digits, gross sensation present via light touch to right foot. No pain or crepitation with range of motion right foot.  No pain with calf compression.   Left foot: First toe nailbed remains well-healed with no acute signs of infection status post total nail avulsion.  Assessment and Plan:  Problem List Items Addressed This Visit    None    Visit Diagnoses    Dehiscence of operative wound, subsequent encounter    -  Primary   98% healed   Status post amputation of great toe, right (HCC)       PVD (peripheral vascular disease) (HCC)       Diabetic polyneuropathy associated with type 2 diabetes mellitus (HCC)       S/P nail surgery          -Patient seen and evaluated -Applied Steri-Strips and stockinet  -Advised patient to make sure to keep Steri-Strips and stockinet in place -Advised patient to continue with post-op shoe on right foot    -Advised patient to limit activity to necessity with use of wheelchair -Will plan for wound check and possible transitioning to soft top tennis shoe at next office visit. In the meantime, patient to call office if any issues or problems arise.   Asencion Islam, DPM

## 2018-01-14 ENCOUNTER — Ambulatory Visit (INDEPENDENT_AMBULATORY_CARE_PROVIDER_SITE_OTHER): Payer: Medicare PPO | Admitting: Sports Medicine

## 2018-01-14 ENCOUNTER — Encounter: Payer: Self-pay | Admitting: Sports Medicine

## 2018-01-14 DIAGNOSIS — I739 Peripheral vascular disease, unspecified: Secondary | ICD-10-CM

## 2018-01-14 DIAGNOSIS — Z89411 Acquired absence of right great toe: Secondary | ICD-10-CM

## 2018-01-14 DIAGNOSIS — E1142 Type 2 diabetes mellitus with diabetic polyneuropathy: Secondary | ICD-10-CM

## 2018-01-14 DIAGNOSIS — Z9889 Other specified postprocedural states: Secondary | ICD-10-CM

## 2018-01-14 DIAGNOSIS — T8131XD Disruption of external operation (surgical) wound, not elsewhere classified, subsequent encounter: Secondary | ICD-10-CM

## 2018-01-14 NOTE — Progress Notes (Signed)
Subjective: Kirsten Gallegos is a 82 y.o. female patient seen today in office for POV # 17 (DOS 09-13-17), S/P right hallux amputation secondary to osteomyelitis and worsening diabetic foot ulcer. Patient is here for a wound check.  Denies pain at surgical site, reports that she is is doing okay will have her son to take her home after this visit to give her insulin shot because her sugars have been elevated.  No other issues noted.   Patient is assisted by son at this visit.  Patient Active Problem List   Diagnosis Date Noted  . Encounter for chest tube removal   . Emesis   . DM type 2 (diabetes mellitus, type 2) (HCC) 01/28/2016  . Pressure ulcer 01/23/2016  . Pulmonary embolism with acute cor pulmonale (HCC)   . Pleural effusion on right   . Shortness of breath   . Hypoxemia   . Pulmonary embolism (HCC) 01/22/2016  . Pleural effusion   . Pericardial effusion   . Rheumatoid arthritis with positive rheumatoid factor (HCC)   . Mycotic toenails 07/23/2013    Current Outpatient Medications on File Prior to Visit  Medication Sig Dispense Refill  . ALPRAZolam (XANAX) 0.25 MG tablet Take 1 tablet (0.25 mg total) by mouth 2 (two) times daily as needed for anxiety. 30 tablet 0  . aspirin EC 81 MG tablet Take 81 mg by mouth daily. Reported on 03/06/2016    . cadexomer iodine (IODOSORB) 0.9 % gel Apply 1 application topically daily as needed for wound care. Right foot bottom at open area 40 g 1  . clindamycin (CLEOCIN) 300 MG capsule Take 1 capsule (300 mg total) 3 (three) times daily by mouth. 30 capsule 0  . furosemide (LASIX) 20 MG tablet Take 10 mg by mouth daily as needed.   0  . glimepiride (AMARYL) 1 MG tablet Take 1 mg by mouth 2 (two) times daily.  0  . HYDROcodone-acetaminophen (NORCO) 10-325 MG tablet Take 1 tablet by mouth every 8 (eight) hours as needed (pain). 15 tablet 0  . Insulin Glargine (LANTUS SOLOSTAR) 100 UNIT/ML Solostar Pen Inject 10 Units into the skin at bedtime. 15 mL 11   . levalbuterol (XOPENEX) 0.63 MG/3ML nebulizer solution Take 3 mLs (0.63 mg total) by nebulization every 6 (six) hours as needed for wheezing or shortness of breath. (Patient not taking: Reported on 03/06/2016) 500 mL 1  . levothyroxine (SYNTHROID, LEVOTHROID) 100 MCG tablet Take 100 mcg by mouth daily.  0  . Liniments (BLUE-EMU SUPER STRENGTH EX) Apply 1 application topically daily as needed (neck pain).    . metoprolol (LOPRESSOR) 25 MG tablet Take 1 tablet (25 mg total) by mouth 2 (two) times daily. 60 tablet 0  . omeprazole (PRILOSEC) 20 MG capsule Take 20 mg by mouth daily.    . pantoprazole (PROTONIX) 40 MG tablet Take 1 tablet (40 mg total) by mouth daily. (Patient not taking: Reported on 03/06/2016) 30 tablet 0  . predniSONE (DELTASONE) 10 MG tablet Take 1 tablet (10 mg total) by mouth daily with breakfast. 30 tablet 0  . promethazine (PHENERGAN) 25 MG tablet Take 1 tablet (25 mg total) by mouth every 8 (eight) hours as needed for nausea or vomiting. 20 tablet 0  . silver sulfADIAZINE (SILVADENE) 1 % cream Apply 1 application topically daily. To right foot ulcer 50 g 0  . simvastatin (ZOCOR) 20 MG tablet Take 20 mg by mouth daily.    Marland Kitchen warfarin (COUMADIN) 5 MG tablet take 1 tablet  by mouth once daily AT 8PM  0  . zolpidem (AMBIEN) 5 MG tablet Take 1 tablet (5 mg total) by mouth at bedtime as needed for sleep. (Patient not taking: Reported on 03/06/2016) 30 tablet 0   Current Facility-Administered Medications on File Prior to Visit  Medication Dose Route Frequency Provider Last Rate Last Dose  . triamcinolone acetonide (KENALOG) 10 MG/ML injection 10 mg  10 mg Other Once Asencion Islam, DPM        Allergies  Allergen Reactions  . Doxycycline     Pancreatitis with nausea  . Cephalexin Other (See Comments)    Pt states since she had meningitis, she not able to take antibiotic, but did not give reactions.   . Codeine Nausea And Vomiting    Objective: There were no vitals filed for this  visit.  General: No acute distress, AAOx3   Right foot: At surgical site, dehiscence area remains healed, no warmth, no drainage, no other signs of infection noted, there is a small abrasion at the dorsal aspect of the right foot and at the side of the right foot along the fifth metatarsal head without any openings or breaks in skin, there is no signs of infection, capillary fill time <5 seconds in all remaining digits, gross sensation present via light touch to right foot. No pain or crepitation with range of motion right foot.  No pain with calf compression.   Left foot: First toe nailbed remains well-healed with no acute signs of infection status post total nail avulsion.  Assessment and Plan:  Problem List Items Addressed This Visit    None    Visit Diagnoses    Dehiscence of operative wound, subsequent encounter    -  Primary   Healed   Status post amputation of great toe, right (HCC)       PVD (peripheral vascular disease) (HCC)       Diabetic polyneuropathy associated with type 2 diabetes mellitus (HCC)       S/P nail surgery          -Patient seen and evaluated -Applied Steri-Strips and stockinet  -Advised patient  that she may shower on tomorrow allowing the Steri-Strips to follow-up on their own -Advised patient to continue with post-op shoe on right foot slowly weaning to a normal shoe 1 hour each day until fully tolerated -Advised patient to limit activity to necessity with use of wheelchair -Will plan for final wound check shoe at next office visit. In the meantime, patient to call office if any issues or problems arise.   Asencion Islam, DPM

## 2018-01-22 ENCOUNTER — Encounter: Payer: Medicare PPO | Admitting: Sports Medicine

## 2018-01-29 ENCOUNTER — Ambulatory Visit (INDEPENDENT_AMBULATORY_CARE_PROVIDER_SITE_OTHER): Payer: Medicare PPO | Admitting: Sports Medicine

## 2018-01-29 ENCOUNTER — Encounter: Payer: Self-pay | Admitting: Sports Medicine

## 2018-01-29 DIAGNOSIS — T8131XD Disruption of external operation (surgical) wound, not elsewhere classified, subsequent encounter: Secondary | ICD-10-CM

## 2018-01-29 DIAGNOSIS — E1142 Type 2 diabetes mellitus with diabetic polyneuropathy: Secondary | ICD-10-CM

## 2018-01-29 DIAGNOSIS — Z89411 Acquired absence of right great toe: Secondary | ICD-10-CM

## 2018-01-29 DIAGNOSIS — I739 Peripheral vascular disease, unspecified: Secondary | ICD-10-CM

## 2018-01-29 DIAGNOSIS — Z9889 Other specified postprocedural states: Secondary | ICD-10-CM

## 2018-01-29 NOTE — Progress Notes (Signed)
Subjective: Kirsten Gallegos is a 82 y.o. female patient seen today in office for POV # 18 (DOS 09-13-17), S/P right hallux amputation secondary to osteomyelitis.  Patient his wound has remained healed over the last 2 visits patient is here for a wound check.  Reports that she did not make it last week for me to check her foot because she was sick and in the hospital where she had fluid overload.  No other issues noted.   Patient is assisted by son at this visit.  Patient Active Problem List   Diagnosis Date Noted  . Encounter for chest tube removal   . Emesis   . DM type 2 (diabetes mellitus, type 2) (HCC) 01/28/2016  . Pressure ulcer 01/23/2016  . Pulmonary embolism with acute cor pulmonale (HCC)   . Pleural effusion on right   . Shortness of breath   . Hypoxemia   . Pulmonary embolism (HCC) 01/22/2016  . Pleural effusion   . Pericardial effusion   . Rheumatoid arthritis with positive rheumatoid factor (HCC)   . Mycotic toenails 07/23/2013    Current Outpatient Medications on File Prior to Visit  Medication Sig Dispense Refill  . ALPRAZolam (XANAX) 0.25 MG tablet Take 1 tablet (0.25 mg total) by mouth 2 (two) times daily as needed for anxiety. 30 tablet 0  . aspirin EC 81 MG tablet Take 81 mg by mouth daily. Reported on 03/06/2016    . cadexomer iodine (IODOSORB) 0.9 % gel Apply 1 application topically daily as needed for wound care. Right foot bottom at open area 40 g 1  . clindamycin (CLEOCIN) 300 MG capsule Take 1 capsule (300 mg total) 3 (three) times daily by mouth. 30 capsule 0  . furosemide (LASIX) 20 MG tablet Take 10 mg by mouth daily as needed.   0  . glimepiride (AMARYL) 1 MG tablet Take 1 mg by mouth 2 (two) times daily.  0  . HYDROcodone-acetaminophen (NORCO) 10-325 MG tablet Take 1 tablet by mouth every 8 (eight) hours as needed (pain). 15 tablet 0  . Insulin Glargine (LANTUS SOLOSTAR) 100 UNIT/ML Solostar Pen Inject 10 Units into the skin at bedtime. 15 mL 11  .  levalbuterol (XOPENEX) 0.63 MG/3ML nebulizer solution Take 3 mLs (0.63 mg total) by nebulization every 6 (six) hours as needed for wheezing or shortness of breath. (Patient not taking: Reported on 03/06/2016) 500 mL 1  . levothyroxine (SYNTHROID, LEVOTHROID) 100 MCG tablet Take 100 mcg by mouth daily.  0  . Liniments (BLUE-EMU SUPER STRENGTH EX) Apply 1 application topically daily as needed (neck pain).    . metoprolol (LOPRESSOR) 25 MG tablet Take 1 tablet (25 mg total) by mouth 2 (two) times daily. 60 tablet 0  . omeprazole (PRILOSEC) 20 MG capsule Take 20 mg by mouth daily.    . pantoprazole (PROTONIX) 40 MG tablet Take 1 tablet (40 mg total) by mouth daily. (Patient not taking: Reported on 03/06/2016) 30 tablet 0  . predniSONE (DELTASONE) 10 MG tablet Take 1 tablet (10 mg total) by mouth daily with breakfast. 30 tablet 0  . promethazine (PHENERGAN) 25 MG tablet Take 1 tablet (25 mg total) by mouth every 8 (eight) hours as needed for nausea or vomiting. 20 tablet 0  . silver sulfADIAZINE (SILVADENE) 1 % cream Apply 1 application topically daily. To right foot ulcer 50 g 0  . simvastatin (ZOCOR) 20 MG tablet Take 20 mg by mouth daily.    Marland Kitchen warfarin (COUMADIN) 5 MG tablet take 1  tablet by mouth once daily AT 8PM  0  . zolpidem (AMBIEN) 5 MG tablet Take 1 tablet (5 mg total) by mouth at bedtime as needed for sleep. (Patient not taking: Reported on 03/06/2016) 30 tablet 0   Current Facility-Administered Medications on File Prior to Visit  Medication Dose Route Frequency Provider Last Rate Last Dose  . triamcinolone acetonide (KENALOG) 10 MG/ML injection 10 mg  10 mg Other Once Asencion Islam, DPM        Allergies  Allergen Reactions  . Doxycycline     Pancreatitis with nausea  . Cephalexin Other (See Comments)    Pt states since she had meningitis, she not able to take antibiotic, but did not give reactions.   . Codeine Nausea And Vomiting    Objective: There were no vitals filed for this  visit.  General: No acute distress, AAOx3   Right foot: Surgical site well-healed, no warmth, no drainage, no other signs of infection noted, abrasions resolved, there are no signs of infection, capillary fill time <5 seconds in all remaining digits, gross sensation present via light touch to right foot. No pain or crepitation with range of motion right foot.  No pain with calf compression.   Left foot: First toe nailbed remains well-healed with no acute signs of infection status post total nail avulsion.  Assessment and Plan:  Problem List Items Addressed This Visit    None    Visit Diagnoses    Dehiscence of operative wound, subsequent encounter    -  Primary   Healed   Status post amputation of great toe, right (HCC)       PVD (peripheral vascular disease) (HCC)       Diabetic polyneuropathy associated with type 2 diabetes mellitus (HCC)       S/P nail surgery       Healed      -Patient seen and evaluated -Wounds remain healed; no further wound care needed at this time -Clean sock applied -Patient advised to slowly transition from postop shoe to good supportive shoe as tolerated -Advised patient to limit activity to necessity with use of wheelchair -Will plan for final wound check at next office visit. In the meantime, patient to call office if any issues or problems arise.   Asencion Islam, DPM

## 2018-02-19 ENCOUNTER — Encounter: Payer: Medicare PPO | Admitting: Sports Medicine

## 2018-03-04 ENCOUNTER — Encounter: Payer: Medicare PPO | Admitting: Sports Medicine

## 2018-03-18 ENCOUNTER — Ambulatory Visit (INDEPENDENT_AMBULATORY_CARE_PROVIDER_SITE_OTHER): Payer: Medicare PPO | Admitting: Sports Medicine

## 2018-03-18 ENCOUNTER — Encounter: Payer: Self-pay | Admitting: Sports Medicine

## 2018-03-18 DIAGNOSIS — Z89411 Acquired absence of right great toe: Secondary | ICD-10-CM

## 2018-03-18 DIAGNOSIS — I739 Peripheral vascular disease, unspecified: Secondary | ICD-10-CM

## 2018-03-18 DIAGNOSIS — E1142 Type 2 diabetes mellitus with diabetic polyneuropathy: Secondary | ICD-10-CM

## 2018-03-18 NOTE — Progress Notes (Signed)
Subjective: Kirsten Gallegos is a 82 y.o. female patient seen today in office for POV # 19  (DOS 09-13-17), S/P right hallux amputation secondary to osteomyelitis.  Patient's wound has remained healed and reports no pain at amputation site or pain in the feet.  No other issues noted.   Patient is assisted by son at this visit.  Patient Active Problem List   Diagnosis Date Noted  . Encounter for chest tube removal   . Emesis   . DM type 2 (diabetes mellitus, type 2) (HCC) 01/28/2016  . Pressure ulcer 01/23/2016  . Pulmonary embolism with acute cor pulmonale (HCC)   . Pleural effusion on right   . Shortness of breath   . Hypoxemia   . Pulmonary embolism (HCC) 01/22/2016  . Pleural effusion   . Pericardial effusion   . Rheumatoid arthritis with positive rheumatoid factor (HCC)   . Mycotic toenails 07/23/2013    Current Outpatient Medications on File Prior to Visit  Medication Sig Dispense Refill  . ALPRAZolam (XANAX) 0.25 MG tablet Take 1 tablet (0.25 mg total) by mouth 2 (two) times daily as needed for anxiety. 30 tablet 0  . aspirin EC 81 MG tablet Take 81 mg by mouth daily. Reported on 03/06/2016    . cadexomer iodine (IODOSORB) 0.9 % gel Apply 1 application topically daily as needed for wound care. Right foot bottom at open area 40 g 1  . clindamycin (CLEOCIN) 300 MG capsule Take 1 capsule (300 mg total) 3 (three) times daily by mouth. 30 capsule 0  . furosemide (LASIX) 20 MG tablet Take 10 mg by mouth daily as needed.   0  . glimepiride (AMARYL) 1 MG tablet Take 1 mg by mouth 2 (two) times daily.  0  . HYDROcodone-acetaminophen (NORCO) 10-325 MG tablet Take 1 tablet by mouth every 8 (eight) hours as needed (pain). 15 tablet 0  . Insulin Glargine (LANTUS SOLOSTAR) 100 UNIT/ML Solostar Pen Inject 10 Units into the skin at bedtime. 15 mL 11  . levalbuterol (XOPENEX) 0.63 MG/3ML nebulizer solution Take 3 mLs (0.63 mg total) by nebulization every 6 (six) hours as needed for wheezing or  shortness of breath. (Patient not taking: Reported on 03/06/2016) 500 mL 1  . levothyroxine (SYNTHROID, LEVOTHROID) 100 MCG tablet Take 100 mcg by mouth daily.  0  . Liniments (BLUE-EMU SUPER STRENGTH EX) Apply 1 application topically daily as needed (neck pain).    . metoprolol (LOPRESSOR) 25 MG tablet Take 1 tablet (25 mg total) by mouth 2 (two) times daily. 60 tablet 0  . omeprazole (PRILOSEC) 20 MG capsule Take 20 mg by mouth daily.    . pantoprazole (PROTONIX) 40 MG tablet Take 1 tablet (40 mg total) by mouth daily. (Patient not taking: Reported on 03/06/2016) 30 tablet 0  . predniSONE (DELTASONE) 10 MG tablet Take 1 tablet (10 mg total) by mouth daily with breakfast. 30 tablet 0  . promethazine (PHENERGAN) 25 MG tablet Take 1 tablet (25 mg total) by mouth every 8 (eight) hours as needed for nausea or vomiting. 20 tablet 0  . silver sulfADIAZINE (SILVADENE) 1 % cream Apply 1 application topically daily. To right foot ulcer 50 g 0  . simvastatin (ZOCOR) 20 MG tablet Take 20 mg by mouth daily.    Marland Kitchen warfarin (COUMADIN) 5 MG tablet take 1 tablet by mouth once daily AT 8PM  0  . zolpidem (AMBIEN) 5 MG tablet Take 1 tablet (5 mg total) by mouth at bedtime as needed for  sleep. (Patient not taking: Reported on 03/06/2016) 30 tablet 0   Current Facility-Administered Medications on File Prior to Visit  Medication Dose Route Frequency Provider Last Rate Last Dose  . triamcinolone acetonide (KENALOG) 10 MG/ML injection 10 mg  10 mg Other Once Asencion Islam, DPM        Allergies  Allergen Reactions  . Doxycycline     Pancreatitis with nausea  . Cephalexin Other (See Comments)    Pt states since she had meningitis, she not able to take antibiotic, but did not give reactions.   . Codeine Nausea And Vomiting    Objective: There were no vitals filed for this visit.  General: No acute distress, AAOx3   Right foot: Surgical site well-healed, no warmth, no drainage, no other signs of infection noted,   there are no signs of infection, capillary fill time <5 seconds in all remaining digits, gross sensation present via light touch to right foot. No pain or crepitation with range of motion right foot.  No pain with calf compression.   Left foot: First toe nailbed remains well-healed with no acute signs of infection status post total nail avulsion.  Assessment and Plan:  Problem List Items Addressed This Visit    None    Visit Diagnoses    Status post amputation of great toe, right (HCC)    -  Primary   PVD (peripheral vascular disease) (HCC)       Diabetic polyneuropathy associated with type 2 diabetes mellitus (HCC)          -Patient seen and evaluated -Wounds remain healed; no further wound care needed at this time -[employer] diabetic shoe order form was completed; office to contact primary care for approval / certification;  Office to arrange shoe fitting and dispensing.  Recommend custom molded insoles with toe filler for right. -Meanwhile continue with good supportive shoes until she is fitted for custom molded insoles with toe filler for right -Advised patient to limit activity to necessity with use of wheelchair or walker -Patient to return next month for diabetic shoe measurements.  In the meantime, patient to call office if any issues or problems arise.   Asencion Islam, DPM

## 2018-04-09 ENCOUNTER — Ambulatory Visit: Payer: Medicare PPO | Admitting: *Deleted

## 2018-04-09 DIAGNOSIS — E1142 Type 2 diabetes mellitus with diabetic polyneuropathy: Secondary | ICD-10-CM

## 2018-05-13 ENCOUNTER — Ambulatory Visit: Payer: Medicare PPO | Admitting: Sports Medicine

## 2018-05-13 ENCOUNTER — Ambulatory Visit (INDEPENDENT_AMBULATORY_CARE_PROVIDER_SITE_OTHER): Payer: Medicare PPO

## 2018-05-13 ENCOUNTER — Encounter: Payer: Self-pay | Admitting: Sports Medicine

## 2018-05-13 ENCOUNTER — Other Ambulatory Visit: Payer: Self-pay | Admitting: Sports Medicine

## 2018-05-13 DIAGNOSIS — M79672 Pain in left foot: Secondary | ICD-10-CM

## 2018-05-13 DIAGNOSIS — M722 Plantar fascial fibromatosis: Secondary | ICD-10-CM

## 2018-05-13 DIAGNOSIS — E1142 Type 2 diabetes mellitus with diabetic polyneuropathy: Secondary | ICD-10-CM

## 2018-05-13 NOTE — Progress Notes (Signed)
Subjective: Kirsten Gallegos is a 82 y.o. diabetic female patient presents to office with complaint of severe heel pain along the bottom and back of the left heel over the last week and a half.  Patient does not recall any injury states that pain is constant throbbing when sitting and sharp when tries to stand.  Patient does not recall any injury or trauma and states that she has been dealing with generalized pain all over because her current medical doctor has not renewed her pain medicine prescriptions.  Patient has not tried any treatments for this problem. Denies any other pedal complaints.   Fasting blood sugar not checked  Patient Active Problem List   Diagnosis Date Noted  . Encounter for chest tube removal   . Emesis   . DM type 2 (diabetes mellitus, type 2) (HCC) 01/28/2016  . Pressure ulcer 01/23/2016  . Pulmonary embolism with acute cor pulmonale (HCC)   . Pleural effusion on right   . Shortness of breath   . Hypoxemia   . Pulmonary embolism (HCC) 01/22/2016  . Pleural effusion   . Pericardial effusion   . Rheumatoid arthritis with positive rheumatoid factor (HCC)   . Mycotic toenails 07/23/2013    Current Outpatient Medications on File Prior to Visit  Medication Sig Dispense Refill  . ALPRAZolam (XANAX) 0.25 MG tablet Take 1 tablet (0.25 mg total) by mouth 2 (two) times daily as needed for anxiety. 30 tablet 0  . aspirin EC 81 MG tablet Take 81 mg by mouth daily. Reported on 03/06/2016    . cadexomer iodine (IODOSORB) 0.9 % gel Apply 1 application topically daily as needed for wound care. Right foot bottom at open area 40 g 1  . clindamycin (CLEOCIN) 300 MG capsule Take 1 capsule (300 mg total) 3 (three) times daily by mouth. 30 capsule 0  . furosemide (LASIX) 20 MG tablet Take 10 mg by mouth daily as needed.   0  . glimepiride (AMARYL) 1 MG tablet Take 1 mg by mouth 2 (two) times daily.  0  . HYDROcodone-acetaminophen (NORCO) 10-325 MG tablet Take 1 tablet by mouth every 8  (eight) hours as needed (pain). 15 tablet 0  . Insulin Glargine (LANTUS SOLOSTAR) 100 UNIT/ML Solostar Pen Inject 10 Units into the skin at bedtime. 15 mL 11  . levalbuterol (XOPENEX) 0.63 MG/3ML nebulizer solution Take 3 mLs (0.63 mg total) by nebulization every 6 (six) hours as needed for wheezing or shortness of breath. 500 mL 1  . levothyroxine (SYNTHROID, LEVOTHROID) 100 MCG tablet Take 100 mcg by mouth daily.  0  . Liniments (BLUE-EMU SUPER STRENGTH EX) Apply 1 application topically daily as needed (neck pain).    . metoprolol (LOPRESSOR) 25 MG tablet Take 1 tablet (25 mg total) by mouth 2 (two) times daily. 60 tablet 0  . omeprazole (PRILOSEC) 20 MG capsule Take 20 mg by mouth daily.    . pantoprazole (PROTONIX) 40 MG tablet Take 1 tablet (40 mg total) by mouth daily. 30 tablet 0  . predniSONE (DELTASONE) 10 MG tablet Take 1 tablet (10 mg total) by mouth daily with breakfast. 30 tablet 0  . promethazine (PHENERGAN) 25 MG tablet Take 1 tablet (25 mg total) by mouth every 8 (eight) hours as needed for nausea or vomiting. 20 tablet 0  . silver sulfADIAZINE (SILVADENE) 1 % cream Apply 1 application topically daily. To right foot ulcer 50 g 0  . simvastatin (ZOCOR) 20 MG tablet Take 20 mg by mouth daily.    Marland Kitchen  warfarin (COUMADIN) 5 MG tablet take 1 tablet by mouth once daily AT 8PM  0  . zolpidem (AMBIEN) 5 MG tablet Take 1 tablet (5 mg total) by mouth at bedtime as needed for sleep. 30 tablet 0   Current Facility-Administered Medications on File Prior to Visit  Medication Dose Route Frequency Provider Last Rate Last Dose  . triamcinolone acetonide (KENALOG) 10 MG/ML injection 10 mg  10 mg Other Once Asencion Islam, DPM        Allergies  Allergen Reactions  . Doxycycline     Pancreatitis with nausea  . Cephalexin Other (See Comments)    Pt states since she had meningitis, she not able to take antibiotic, but did not give reactions.   . Codeine Nausea And Vomiting     Objective: Physical Exam General: The patient is alert and oriented x3 in no acute distress.  Dermatology: Skin is is cool but dry with no open lesions previous surgical site well-healed.  Vascular: Dorsalis Pedis pulse and Posterior Tibial pulse  are faintly palpable.   Neurological: Protective sensation diminished bilateral patient is noted to be hypersensitive on the left.  Musculoskeletal: Tenderness to palpation at the medial calcaneal tubercale and through the insertion of the plantar fascia on the left foot and minimal tenderness at the Achilles insertion on the left.  Patient is status post right hallux amputation.  Xray, Left foot:  Decreased osseous mineralization. Joint space narrowing diffusely consistent with arthritis.  Previous ankle hardware intact.  No fracture/dislocation/boney destruction. Calcaneal spur present with mild thickening of plantar fascia. No other soft tissue abnormalities or radiopaque foreign bodies.   Assessment and Plan: Problem List Items Addressed This Visit    None    Visit Diagnoses    Pain of left heel    -  Primary   Plantar fasciitis       Diabetic polyneuropathy associated with type 2 diabetes mellitus (HCC)          -Complete examination performed.  -Xrays reviewed -Discussed with patient in detail the condition of plantar fasciitis, how this occurs and general treatment options. Explained both conservative and surgical treatments.  -After oral consent and aseptic prep, injected a mixture containing 1 ml of 2%  plain lidocaine, 1 ml 0.5% plain marcaine, 0.5 ml of kenalog 10 and 0.5 ml of dexamethasone phosphate into left heel.  Post-injection care discussed with patient.  -Recommend patient to ice with assistance of son to the left heel to prevent injection site reaction pain -Dispensed heel lifts to wear in both shoes -Patient is awaiting diabetic shoes with custom toe filler for right -Patient to return to office in 2 weeks if  pain is not better or sooner if problems or questions arise.  Asencion Islam, DPM

## 2018-08-12 ENCOUNTER — Ambulatory Visit: Payer: Medicare PPO | Admitting: Sports Medicine

## 2018-08-26 ENCOUNTER — Ambulatory Visit: Payer: Medicare PPO | Admitting: Sports Medicine

## 2018-08-26 ENCOUNTER — Encounter: Payer: Self-pay | Admitting: Sports Medicine

## 2018-08-26 DIAGNOSIS — S91209A Unspecified open wound of unspecified toe(s) with damage to nail, initial encounter: Secondary | ICD-10-CM | POA: Diagnosis not present

## 2018-08-26 DIAGNOSIS — I739 Peripheral vascular disease, unspecified: Secondary | ICD-10-CM

## 2018-08-26 DIAGNOSIS — E1142 Type 2 diabetes mellitus with diabetic polyneuropathy: Secondary | ICD-10-CM

## 2018-08-26 NOTE — Progress Notes (Signed)
Subjective: Nickisha Hum is a 82 y.o. diabetic female patient presents to office with complaint of noticing bleeding from the right third toe does not recall when or how it started however son does state on Saturday he remembers seeing blood at the toe and likely this could have been when he bumped her foot when he was helping her to get dressed.  Patient admits that she has random sharp shooting pains and she does feel like her right great toe is still present and states that her feet constantly throbs and she constantly has a joint pain across her body and is wondering if her blood thinner has anything to do with the symptoms.  Patient denies nausea vomiting fever chills or any other constitutional symptoms at this time.  Patient has not tried any treatments for this problem. Denies any other pedal complaints.   Fasting blood sugar not checked  Patient Active Problem List   Diagnosis Date Noted  . Encounter for chest tube removal   . Emesis   . DM type 2 (diabetes mellitus, type 2) (HCC) 01/28/2016  . Pressure ulcer 01/23/2016  . Pulmonary embolism with acute cor pulmonale (HCC)   . Pleural effusion on right   . Shortness of breath   . Hypoxemia   . Pulmonary embolism (HCC) 01/22/2016  . Pleural effusion   . Pericardial effusion   . Rheumatoid arthritis with positive rheumatoid factor (HCC)   . Mycotic toenails 07/23/2013    Current Outpatient Medications on File Prior to Visit  Medication Sig Dispense Refill  . ALPRAZolam (XANAX) 0.25 MG tablet Take 1 tablet (0.25 mg total) by mouth 2 (two) times daily as needed for anxiety. 30 tablet 0  . aspirin EC 81 MG tablet Take 81 mg by mouth daily. Reported on 03/06/2016    . cadexomer iodine (IODOSORB) 0.9 % gel Apply 1 application topically daily as needed for wound care. Right foot bottom at open area 40 g 1  . clindamycin (CLEOCIN) 300 MG capsule Take 1 capsule (300 mg total) 3 (three) times daily by mouth. 30 capsule 0  . furosemide  (LASIX) 20 MG tablet Take 10 mg by mouth daily as needed.   0  . glimepiride (AMARYL) 1 MG tablet Take 1 mg by mouth 2 (two) times daily.  0  . HYDROcodone-acetaminophen (NORCO) 10-325 MG tablet Take 1 tablet by mouth every 8 (eight) hours as needed (pain). 15 tablet 0  . Insulin Glargine (LANTUS SOLOSTAR) 100 UNIT/ML Solostar Pen Inject 10 Units into the skin at bedtime. 15 mL 11  . levalbuterol (XOPENEX) 0.63 MG/3ML nebulizer solution Take 3 mLs (0.63 mg total) by nebulization every 6 (six) hours as needed for wheezing or shortness of breath. 500 mL 1  . levothyroxine (SYNTHROID, LEVOTHROID) 100 MCG tablet Take 100 mcg by mouth daily.  0  . Liniments (BLUE-EMU SUPER STRENGTH EX) Apply 1 application topically daily as needed (neck pain).    . metoprolol (LOPRESSOR) 25 MG tablet Take 1 tablet (25 mg total) by mouth 2 (two) times daily. 60 tablet 0  . omeprazole (PRILOSEC) 20 MG capsule Take 20 mg by mouth daily.    . pantoprazole (PROTONIX) 40 MG tablet Take 1 tablet (40 mg total) by mouth daily. 30 tablet 0  . predniSONE (DELTASONE) 10 MG tablet Take 1 tablet (10 mg total) by mouth daily with breakfast. 30 tablet 0  . promethazine (PHENERGAN) 25 MG tablet Take 1 tablet (25 mg total) by mouth every 8 (eight) hours as  needed for nausea or vomiting. 20 tablet 0  . silver sulfADIAZINE (SILVADENE) 1 % cream Apply 1 application topically daily. To right foot ulcer 50 g 0  . simvastatin (ZOCOR) 20 MG tablet Take 20 mg by mouth daily.    Marland Kitchen warfarin (COUMADIN) 5 MG tablet take 1 tablet by mouth once daily AT 8PM  0  . zolpidem (AMBIEN) 5 MG tablet Take 1 tablet (5 mg total) by mouth at bedtime as needed for sleep. 30 tablet 0   Current Facility-Administered Medications on File Prior to Visit  Medication Dose Route Frequency Provider Last Rate Last Dose  . triamcinolone acetonide (KENALOG) 10 MG/ML injection 10 mg  10 mg Other Once Asencion Islam, DPM        Allergies  Allergen Reactions  .  Doxycycline     Pancreatitis with nausea  . Cephalexin Other (See Comments)    Pt states since she had meningitis, she not able to take antibiotic, but did not give reactions.   . Codeine Nausea And Vomiting    Objective: Physical Exam General: The patient is alert and oriented x3 in no acute distress.  Dermatology: Skin is is cool but dry with no open lesions previous surgical site well-healed.  There is mild reactive callus plantar right forefoot with no signs of infection.  To the right third toe there is a partially attached nail with dried blood at the base with no surrounding signs of infection.  Vascular: Dorsalis Pedis pulse and Posterior Tibial pulse  are faintly palpable.   Neurological: Protective sensation diminished bilateral patient is noted to be hypersensitive bilateral.  Musculoskeletal: There's mild tenderness to palpation at the right third toe,  status post right hallux amputation.   Assessment and Plan: Problem List Items Addressed This Visit    None    Visit Diagnoses    Traumatic avulsion of nail plate of toe, initial encounter    -  Primary   Diabetic polyneuropathy associated with type 2 diabetes mellitus (HCC)       PVD (peripheral vascular disease) (HCC)          -Complete examination performed.  -Discussed treatment options for partially attached right third toenail -Mechanically debrided the loose right third toenail using a sterile nail nipper without incident and applied a small amount of Betadine and covered with a Band-Aid advised patient to do the same daily and to leave toe open to air at bedtime and for baths advised patient to closely monitor for signs of infection if noted to call or return to office sooner -Mechanically at no charge debrided callus right foot plantar aspect without incident -Patient is awaiting diabetic shoes with custom toe filler for right -Advised patient to follow-up with primary care doctor about her blood thinner for  possible adjustments or switch to a different kind since patient is having joint symptoms and increased throbbing also advised patient if her primary care doctor fails to switch this medication may benefit from updated vascular studies -Advised patient that sharp pain is likely phantom pain and diabetic neuropathy continue with comfort measures -Patient to return to office when call for diabetic shoes or sooner if problems or questions arise.  Asencion Islam, DPM

## 2018-09-18 NOTE — Progress Notes (Signed)
Patient ID: Kirsten Gallegos, female   DOB: 1936/04/26, 82 y.o.   MRN: [id number]   Patient presents at Dr Wynema Birch request to be measured for diabetic shoes and inserts with toe filler right with [facility].  Patient will be called when shoes and inserts arrive to schedule a fitting.

## 2018-09-27 DIAGNOSIS — I10 Essential (primary) hypertension: Secondary | ICD-10-CM

## 2018-09-27 DIAGNOSIS — E039 Hypothyroidism, unspecified: Secondary | ICD-10-CM

## 2018-09-27 DIAGNOSIS — D62 Acute posthemorrhagic anemia: Secondary | ICD-10-CM

## 2018-09-27 DIAGNOSIS — N179 Acute kidney failure, unspecified: Secondary | ICD-10-CM

## 2018-09-27 DIAGNOSIS — S7010XA Contusion of unspecified thigh, initial encounter: Secondary | ICD-10-CM | POA: Diagnosis not present

## 2018-09-27 DIAGNOSIS — I5032 Chronic diastolic (congestive) heart failure: Secondary | ICD-10-CM

## 2018-09-27 DIAGNOSIS — I2510 Atherosclerotic heart disease of native coronary artery without angina pectoris: Secondary | ICD-10-CM

## 2018-09-27 DIAGNOSIS — E119 Type 2 diabetes mellitus without complications: Secondary | ICD-10-CM

## 2018-09-27 DIAGNOSIS — E1165 Type 2 diabetes mellitus with hyperglycemia: Secondary | ICD-10-CM

## 2018-09-28 DIAGNOSIS — I2510 Atherosclerotic heart disease of native coronary artery without angina pectoris: Secondary | ICD-10-CM | POA: Diagnosis not present

## 2018-09-28 DIAGNOSIS — I10 Essential (primary) hypertension: Secondary | ICD-10-CM | POA: Diagnosis not present

## 2018-09-28 DIAGNOSIS — S7010XA Contusion of unspecified thigh, initial encounter: Secondary | ICD-10-CM | POA: Diagnosis not present

## 2018-09-28 DIAGNOSIS — D62 Acute posthemorrhagic anemia: Secondary | ICD-10-CM | POA: Diagnosis not present

## 2018-09-29 DIAGNOSIS — S7010XA Contusion of unspecified thigh, initial encounter: Secondary | ICD-10-CM | POA: Diagnosis not present

## 2018-09-29 DIAGNOSIS — I10 Essential (primary) hypertension: Secondary | ICD-10-CM | POA: Diagnosis not present

## 2018-09-29 DIAGNOSIS — I2510 Atherosclerotic heart disease of native coronary artery without angina pectoris: Secondary | ICD-10-CM | POA: Diagnosis not present

## 2018-09-29 DIAGNOSIS — D62 Acute posthemorrhagic anemia: Secondary | ICD-10-CM | POA: Diagnosis not present

## 2018-10-25 DIAGNOSIS — N390 Urinary tract infection, site not specified: Secondary | ICD-10-CM

## 2018-10-25 DIAGNOSIS — R531 Weakness: Secondary | ICD-10-CM

## 2018-10-25 DIAGNOSIS — N179 Acute kidney failure, unspecified: Secondary | ICD-10-CM | POA: Diagnosis not present

## 2018-10-26 DIAGNOSIS — N189 Chronic kidney disease, unspecified: Secondary | ICD-10-CM

## 2018-10-26 DIAGNOSIS — E1165 Type 2 diabetes mellitus with hyperglycemia: Secondary | ICD-10-CM

## 2018-10-26 DIAGNOSIS — N179 Acute kidney failure, unspecified: Secondary | ICD-10-CM | POA: Diagnosis not present

## 2018-10-26 DIAGNOSIS — R531 Weakness: Secondary | ICD-10-CM | POA: Diagnosis not present

## 2018-10-26 DIAGNOSIS — K219 Gastro-esophageal reflux disease without esophagitis: Secondary | ICD-10-CM

## 2018-10-26 DIAGNOSIS — I2510 Atherosclerotic heart disease of native coronary artery without angina pectoris: Secondary | ICD-10-CM

## 2018-10-26 DIAGNOSIS — E039 Hypothyroidism, unspecified: Secondary | ICD-10-CM

## 2018-10-26 DIAGNOSIS — N390 Urinary tract infection, site not specified: Secondary | ICD-10-CM | POA: Diagnosis not present

## 2018-10-27 DIAGNOSIS — N179 Acute kidney failure, unspecified: Secondary | ICD-10-CM | POA: Diagnosis not present

## 2018-10-27 DIAGNOSIS — R531 Weakness: Secondary | ICD-10-CM | POA: Diagnosis not present

## 2018-10-27 DIAGNOSIS — N390 Urinary tract infection, site not specified: Secondary | ICD-10-CM | POA: Diagnosis not present

## 2018-10-27 DIAGNOSIS — K219 Gastro-esophageal reflux disease without esophagitis: Secondary | ICD-10-CM | POA: Diagnosis not present

## 2018-10-28 DIAGNOSIS — K862 Cyst of pancreas: Secondary | ICD-10-CM

## 2018-10-28 DIAGNOSIS — Z8711 Personal history of peptic ulcer disease: Secondary | ICD-10-CM

## 2018-10-28 DIAGNOSIS — N179 Acute kidney failure, unspecified: Secondary | ICD-10-CM | POA: Diagnosis not present

## 2018-10-28 DIAGNOSIS — R109 Unspecified abdominal pain: Secondary | ICD-10-CM

## 2018-10-28 DIAGNOSIS — I82409 Acute embolism and thrombosis of unspecified deep veins of unspecified lower extremity: Secondary | ICD-10-CM

## 2018-10-28 DIAGNOSIS — N390 Urinary tract infection, site not specified: Secondary | ICD-10-CM | POA: Diagnosis not present

## 2018-10-29 DIAGNOSIS — N179 Acute kidney failure, unspecified: Secondary | ICD-10-CM | POA: Diagnosis not present

## 2018-10-29 DIAGNOSIS — K862 Cyst of pancreas: Secondary | ICD-10-CM | POA: Diagnosis not present

## 2018-10-29 DIAGNOSIS — N390 Urinary tract infection, site not specified: Secondary | ICD-10-CM | POA: Diagnosis not present

## 2018-10-29 DIAGNOSIS — I82409 Acute embolism and thrombosis of unspecified deep veins of unspecified lower extremity: Secondary | ICD-10-CM | POA: Diagnosis not present

## 2018-11-01 DIAGNOSIS — E039 Hypothyroidism, unspecified: Secondary | ICD-10-CM

## 2018-11-01 DIAGNOSIS — I5032 Chronic diastolic (congestive) heart failure: Secondary | ICD-10-CM

## 2018-11-01 DIAGNOSIS — R112 Nausea with vomiting, unspecified: Secondary | ICD-10-CM

## 2018-11-01 DIAGNOSIS — K862 Cyst of pancreas: Secondary | ICD-10-CM

## 2018-11-01 DIAGNOSIS — I10 Essential (primary) hypertension: Secondary | ICD-10-CM

## 2018-11-01 DIAGNOSIS — R531 Weakness: Secondary | ICD-10-CM

## 2018-11-01 DIAGNOSIS — N390 Urinary tract infection, site not specified: Secondary | ICD-10-CM

## 2018-11-01 DIAGNOSIS — E1142 Type 2 diabetes mellitus with diabetic polyneuropathy: Secondary | ICD-10-CM

## 2018-11-01 DIAGNOSIS — M069 Rheumatoid arthritis, unspecified: Secondary | ICD-10-CM

## 2018-11-01 DIAGNOSIS — N183 Chronic kidney disease, stage 3 (moderate): Secondary | ICD-10-CM

## 2018-11-02 DIAGNOSIS — E11621 Type 2 diabetes mellitus with foot ulcer: Secondary | ICD-10-CM

## 2018-11-02 DIAGNOSIS — R112 Nausea with vomiting, unspecified: Secondary | ICD-10-CM | POA: Diagnosis not present

## 2018-11-02 DIAGNOSIS — I2510 Atherosclerotic heart disease of native coronary artery without angina pectoris: Secondary | ICD-10-CM

## 2018-11-02 DIAGNOSIS — N390 Urinary tract infection, site not specified: Secondary | ICD-10-CM | POA: Diagnosis not present

## 2018-11-02 DIAGNOSIS — N189 Chronic kidney disease, unspecified: Secondary | ICD-10-CM

## 2018-11-02 DIAGNOSIS — I517 Cardiomegaly: Secondary | ICD-10-CM | POA: Diagnosis not present

## 2018-11-03 DIAGNOSIS — I2510 Atherosclerotic heart disease of native coronary artery without angina pectoris: Secondary | ICD-10-CM | POA: Diagnosis not present

## 2018-11-03 DIAGNOSIS — R112 Nausea with vomiting, unspecified: Secondary | ICD-10-CM | POA: Diagnosis not present

## 2018-11-03 DIAGNOSIS — E11621 Type 2 diabetes mellitus with foot ulcer: Secondary | ICD-10-CM | POA: Diagnosis not present

## 2018-11-03 DIAGNOSIS — N390 Urinary tract infection, site not specified: Secondary | ICD-10-CM | POA: Diagnosis not present

## 2018-11-04 DIAGNOSIS — E11621 Type 2 diabetes mellitus with foot ulcer: Secondary | ICD-10-CM | POA: Diagnosis not present

## 2018-11-04 DIAGNOSIS — R112 Nausea with vomiting, unspecified: Secondary | ICD-10-CM | POA: Diagnosis not present

## 2018-11-04 DIAGNOSIS — N390 Urinary tract infection, site not specified: Secondary | ICD-10-CM | POA: Diagnosis not present

## 2018-11-04 DIAGNOSIS — I2510 Atherosclerotic heart disease of native coronary artery without angina pectoris: Secondary | ICD-10-CM | POA: Diagnosis not present

## 2018-11-05 DIAGNOSIS — R112 Nausea with vomiting, unspecified: Secondary | ICD-10-CM | POA: Diagnosis not present

## 2018-11-05 DIAGNOSIS — I2510 Atherosclerotic heart disease of native coronary artery without angina pectoris: Secondary | ICD-10-CM | POA: Diagnosis not present

## 2018-11-05 DIAGNOSIS — N390 Urinary tract infection, site not specified: Secondary | ICD-10-CM | POA: Diagnosis not present

## 2018-11-05 DIAGNOSIS — E11621 Type 2 diabetes mellitus with foot ulcer: Secondary | ICD-10-CM | POA: Diagnosis not present

## 2018-11-06 DIAGNOSIS — I2510 Atherosclerotic heart disease of native coronary artery without angina pectoris: Secondary | ICD-10-CM | POA: Diagnosis not present

## 2018-11-06 DIAGNOSIS — N390 Urinary tract infection, site not specified: Secondary | ICD-10-CM | POA: Diagnosis not present

## 2018-11-06 DIAGNOSIS — R112 Nausea with vomiting, unspecified: Secondary | ICD-10-CM | POA: Diagnosis not present

## 2018-11-06 DIAGNOSIS — E11621 Type 2 diabetes mellitus with foot ulcer: Secondary | ICD-10-CM | POA: Diagnosis not present

## 2018-11-07 DIAGNOSIS — N390 Urinary tract infection, site not specified: Secondary | ICD-10-CM | POA: Diagnosis not present

## 2018-11-07 DIAGNOSIS — R112 Nausea with vomiting, unspecified: Secondary | ICD-10-CM | POA: Diagnosis not present

## 2018-11-07 DIAGNOSIS — I2510 Atherosclerotic heart disease of native coronary artery without angina pectoris: Secondary | ICD-10-CM | POA: Diagnosis not present

## 2018-11-07 DIAGNOSIS — E11621 Type 2 diabetes mellitus with foot ulcer: Secondary | ICD-10-CM | POA: Diagnosis not present

## 2018-11-08 DIAGNOSIS — N179 Acute kidney failure, unspecified: Secondary | ICD-10-CM

## 2018-11-08 DIAGNOSIS — J690 Pneumonitis due to inhalation of food and vomit: Secondary | ICD-10-CM | POA: Diagnosis not present

## 2018-11-08 DIAGNOSIS — R531 Weakness: Secondary | ICD-10-CM

## 2018-11-08 DIAGNOSIS — J9600 Acute respiratory failure, unspecified whether with hypoxia or hypercapnia: Secondary | ICD-10-CM | POA: Diagnosis not present

## 2018-11-09 DIAGNOSIS — J9600 Acute respiratory failure, unspecified whether with hypoxia or hypercapnia: Secondary | ICD-10-CM | POA: Diagnosis not present

## 2018-11-09 DIAGNOSIS — R531 Weakness: Secondary | ICD-10-CM | POA: Diagnosis not present

## 2018-11-09 DIAGNOSIS — J690 Pneumonitis due to inhalation of food and vomit: Secondary | ICD-10-CM | POA: Diagnosis not present

## 2018-11-09 DIAGNOSIS — N179 Acute kidney failure, unspecified: Secondary | ICD-10-CM | POA: Diagnosis not present

## 2018-11-10 DIAGNOSIS — R531 Weakness: Secondary | ICD-10-CM | POA: Diagnosis not present

## 2018-11-10 DIAGNOSIS — J9600 Acute respiratory failure, unspecified whether with hypoxia or hypercapnia: Secondary | ICD-10-CM | POA: Diagnosis not present

## 2018-11-10 DIAGNOSIS — J690 Pneumonitis due to inhalation of food and vomit: Secondary | ICD-10-CM | POA: Diagnosis not present

## 2018-11-10 DIAGNOSIS — N179 Acute kidney failure, unspecified: Secondary | ICD-10-CM | POA: Diagnosis not present

## 2019-01-03 DEATH — deceased
